# Patient Record
Sex: Female | Born: 1965
Health system: Southern US, Community
[De-identification: ages and names within clinical notes are randomized; demographics above are authoritative.]

## PROBLEM LIST (undated history)

## (undated) DIAGNOSIS — I10 Essential (primary) hypertension: Secondary | ICD-10-CM

## (undated) DIAGNOSIS — M791 Myalgia, unspecified site: Secondary | ICD-10-CM

## (undated) DIAGNOSIS — F419 Anxiety disorder, unspecified: Secondary | ICD-10-CM

## (undated) DIAGNOSIS — Z1501 Genetic susceptibility to malignant neoplasm of breast: Secondary | ICD-10-CM

## (undated) DIAGNOSIS — R011 Cardiac murmur, unspecified: Secondary | ICD-10-CM

## (undated) DIAGNOSIS — E785 Hyperlipidemia, unspecified: Secondary | ICD-10-CM

## (undated) DIAGNOSIS — R519 Headache, unspecified: Secondary | ICD-10-CM

## (undated) DIAGNOSIS — Z1509 Genetic susceptibility to other malignant neoplasm: Secondary | ICD-10-CM

## (undated) DIAGNOSIS — Z1589 Genetic susceptibility to other disease: Secondary | ICD-10-CM

## (undated) HISTORY — DX: Genetic susceptibility to other malignant neoplasm: Z15.09

## (undated) HISTORY — DX: Cardiac murmur, unspecified: R01.1

## (undated) HISTORY — DX: Myalgia, unspecified site: M79.10

## (undated) HISTORY — PX: FOOT SURGERY: SHX648

## (undated) HISTORY — DX: Genetic susceptibility to malignant neoplasm of breast: Z15.01

## (undated) HISTORY — DX: Genetic susceptibility to other disease: Z15.89

## (undated) HISTORY — PX: COLONOSCOPY: SHX174

## (undated) HISTORY — PX: BREAST SURGERY: SHX581

## (undated) HISTORY — DX: Hyperlipidemia, unspecified: E78.5

## (undated) HISTORY — DX: Anxiety disorder, unspecified: F41.9

## (undated) HISTORY — PX: POLYPECTOMY: SHX149

## (undated) HISTORY — DX: Essential (primary) hypertension: I10

## (undated) HISTORY — DX: Headache, unspecified: R51.9

---

## 2002-03-29 ENCOUNTER — Encounter: Admission: RE | Admit: 2002-03-29 | Discharge: 2002-03-29 | Payer: Self-pay | Admitting: Family Medicine

## 2002-03-29 ENCOUNTER — Encounter: Payer: Self-pay | Admitting: Family Medicine

## 2002-08-10 ENCOUNTER — Emergency Department (HOSPITAL_COMMUNITY): Admission: EM | Admit: 2002-08-10 | Discharge: 2002-08-11 | Payer: Self-pay | Admitting: Emergency Medicine

## 2002-08-11 ENCOUNTER — Encounter: Payer: Self-pay | Admitting: Emergency Medicine

## 2002-12-18 HISTORY — PX: PELVIC LAPAROSCOPY: SHX162

## 2003-01-04 ENCOUNTER — Ambulatory Visit (HOSPITAL_BASED_OUTPATIENT_CLINIC_OR_DEPARTMENT_OTHER): Admission: RE | Admit: 2003-01-04 | Discharge: 2003-01-04 | Payer: Self-pay | Admitting: Gynecology

## 2003-01-04 ENCOUNTER — Encounter (INDEPENDENT_AMBULATORY_CARE_PROVIDER_SITE_OTHER): Payer: Self-pay | Admitting: Specialist

## 2003-01-04 ENCOUNTER — Ambulatory Visit (HOSPITAL_COMMUNITY): Admission: RE | Admit: 2003-01-04 | Discharge: 2003-01-04 | Payer: Self-pay | Admitting: Gynecology

## 2003-09-14 ENCOUNTER — Other Ambulatory Visit: Admission: RE | Admit: 2003-09-14 | Discharge: 2003-09-14 | Payer: Self-pay | Admitting: Gynecology

## 2004-03-19 ENCOUNTER — Encounter: Admission: RE | Admit: 2004-03-19 | Discharge: 2004-03-19 | Payer: Self-pay | Admitting: Gynecology

## 2004-08-12 ENCOUNTER — Encounter: Admission: RE | Admit: 2004-08-12 | Discharge: 2004-09-05 | Payer: Self-pay | Admitting: Family Medicine

## 2004-09-17 ENCOUNTER — Other Ambulatory Visit: Admission: RE | Admit: 2004-09-17 | Discharge: 2004-09-17 | Payer: Self-pay | Admitting: Gynecology

## 2004-10-17 HISTORY — PX: NOVASURE ABLATION: SHX5394

## 2004-11-04 ENCOUNTER — Ambulatory Visit (HOSPITAL_COMMUNITY): Admission: RE | Admit: 2004-11-04 | Discharge: 2004-11-04 | Payer: Self-pay | Admitting: Gynecology

## 2004-12-29 ENCOUNTER — Emergency Department: Payer: Self-pay | Admitting: Unknown Physician Specialty

## 2004-12-29 ENCOUNTER — Other Ambulatory Visit: Payer: Self-pay

## 2005-10-13 ENCOUNTER — Other Ambulatory Visit: Admission: RE | Admit: 2005-10-13 | Discharge: 2005-10-13 | Payer: Self-pay | Admitting: Gynecology

## 2006-10-28 ENCOUNTER — Other Ambulatory Visit: Admission: RE | Admit: 2006-10-28 | Discharge: 2006-10-28 | Payer: Self-pay | Admitting: Gynecology

## 2007-04-14 ENCOUNTER — Encounter: Admission: RE | Admit: 2007-04-14 | Discharge: 2007-04-14 | Payer: Self-pay | Admitting: Gynecology

## 2007-11-03 ENCOUNTER — Ambulatory Visit: Payer: Self-pay | Admitting: Gynecology

## 2007-11-03 ENCOUNTER — Other Ambulatory Visit: Admission: RE | Admit: 2007-11-03 | Discharge: 2007-11-03 | Payer: Self-pay | Admitting: Gynecology

## 2007-11-03 ENCOUNTER — Encounter: Payer: Self-pay | Admitting: Gynecology

## 2008-05-17 ENCOUNTER — Encounter: Admission: RE | Admit: 2008-05-17 | Discharge: 2008-05-17 | Payer: Self-pay | Admitting: Gynecology

## 2008-06-14 ENCOUNTER — Emergency Department (HOSPITAL_COMMUNITY): Admission: EM | Admit: 2008-06-14 | Discharge: 2008-06-14 | Payer: Self-pay | Admitting: Emergency Medicine

## 2008-06-27 ENCOUNTER — Encounter: Admission: RE | Admit: 2008-06-27 | Discharge: 2008-06-27 | Payer: Self-pay | Admitting: Family Medicine

## 2008-11-15 ENCOUNTER — Encounter: Payer: Self-pay | Admitting: Gynecology

## 2008-11-15 ENCOUNTER — Ambulatory Visit: Payer: Self-pay | Admitting: Gynecology

## 2008-11-15 ENCOUNTER — Other Ambulatory Visit: Admission: RE | Admit: 2008-11-15 | Discharge: 2008-11-15 | Payer: Self-pay | Admitting: Gynecology

## 2009-07-04 ENCOUNTER — Encounter: Admission: RE | Admit: 2009-07-04 | Discharge: 2009-07-04 | Payer: Self-pay | Admitting: Gynecology

## 2009-11-28 ENCOUNTER — Ambulatory Visit: Payer: Self-pay | Admitting: Gynecology

## 2009-11-28 ENCOUNTER — Other Ambulatory Visit: Admission: RE | Admit: 2009-11-28 | Discharge: 2009-11-28 | Payer: Self-pay | Admitting: Gynecology

## 2010-03-09 ENCOUNTER — Encounter: Payer: Self-pay | Admitting: Gynecology

## 2010-07-04 NOTE — H&P (Signed)
NAME:  Angela Ramsey, ROSENSTEEL                         ACCOUNT NO.:  [id number]   MEDICAL RECORD NO.:  [id number]                   PATIENT TYPE:  AMB   LOCATION:  [facility]                                 FACILITY:  [facility]   PHYSICIAN:  Timothy P. Fontaine, M.D.           DATE OF BIRTH:  07/06/1965   DATE OF ADMISSION:  01/04/2003  DATE OF DISCHARGE:                                HISTORY & PHYSICAL   She is having day surgery at [facility] January 04, 2003,  at 8:30 a.m.   CHIEF COMPLAINT:  Pelvic discomfort.   HISTORY OF PRESENT ILLNESS:  This is a 45 year old G2, P2 female, vasectomy  birth control, who presents with increasing pelvic discomfort, bloating and  cramping.  She has pain that is persistent with persistent dyspareunia as if  something is being hit centrally.  Outpatient evaluation included a normal  ultrasound, CBC which showed some low-grade anemia with hemoglobin of 11,  normal white count, and a normal TSH.  Patient is admitted at this time for  laser video laparoscopy.   PAST MEDICAL HISTORY:  Uncomplicated.   PAST SURGICAL HISTORY:  Spontaneous vaginal delivery x2.   ALLERGIES:  No current medications.   CURRENT MEDICATIONS:  Ibuprofen p.r.n. pain.   REVIEW OF SYSTEMS:  Noncontributory.   FAMILY HISTORY:  Noncontributory.   SOCIAL HISTORY:  Noncontributory.   ADMISSION PHYSICAL EXAMINATION:  VITAL SIGNS:  Afebrile, vital signs are  stable.  HEENT:  Normal.  LUNGS:  Clear.  CARDIAC:  Regular rate without rubs, murmurs or gallops.  ABDOMEN:  Benign.  PELVIC:  External, BUS, vagina normal.  Cervix normal.  Uterus normal size,  midline, mobile, nontender.  Adnexa without masses or tenderness.   ASSESSMENT:  A 45 year old G2, P2 female, vasectomy birth control, worsening  pelvic discomfort, persistent dyspareunia, normal ultrasound for laser video  laparoscopy.  I reviewed with the patient the various scenarios and  pathology possibilities to  include endometriosis, adhesive disease, or no  pathology found.  Patient and her husband were both counseled and understand  that there are no guarantees as far as pain relief and that the pain may  persist, worsen, or change following the procedure, and they understand and  accept this.  They also understand the possibility of finding a normal  pelvis, which no further therapy will be delivered.  The risks involved with  laser video laparoscopy was discussed including instrumentation,  insufflation, trocar placement, use of sharp blunt dissection,  electrocautery, and the use of laser.  The risks of inadvertent injury to  internal organs including bowel, bladder, ureters, vessels, and nerves  necessitating major exploratory reparative surgeries and future reparative  surgeries including ostomy formation was all discussed, understood and  accepted.  The risks of hemorrhage necessitating transfusion and the risk of  transfusion were discussed to include transfusion reaction, hepatitis, HIV,  mad cow disease, and other unknown entities.  The  risk of infection both  incisional requiring opening and draining of incisions as well as internal  requiring prolonged antibiotics was all discussed, understood and accepted.  The patient's questions were answered to her satisfaction and she is ready  to proceed with surgery.  Preoperative hemoglobin is 13.2 and a negative  qualitative HCG.                                               Timothy P. Audie Box, M.D.    TPF/MEDQ  D:  01/02/2003  T:  01/02/2003  Job:  [id number]

## 2010-07-04 NOTE — H&P (Signed)
Angela Ramsey, Angela Ramsey               ACCOUNT NO.:  [id number]   MEDICAL RECORD NO.:  [id number]          PATIENT TYPE:  AMB   LOCATION:  [facility]                           FACILITY:  [facility]   PHYSICIAN:  Timothy P. Fontaine, M.D.DATE OF BIRTH:  06/19/1965   DATE OF ADMISSION:  11/04/2004  DATE OF DISCHARGE:                                HISTORY & PHYSICAL   CHIEF COMPLAINT:  Menorrhagia.   HISTORY OF PRESENT ILLNESS:  A 45 year old G2, P2 female, vasectomy, birth  control who presents with a history of increasing menorrhagia. The patient  notes that her periods are lasting 3-4 days for which she needs to wear  night pads all the time and has had multiple bleed through episodes as well  as disrupting vacation.  The patient's outpatient evaluation included a  normal CBC, a normal thyroid function, and a negative sonohistogram. The  patient is admitted at this time for NovaSure endometrial ablation.   Past medical history is uncomplicated. Past surgical history includes  laparoscopy with diagnosis of endometriosis and 2004.   ALLERGIES:  None.   CURRENT MEDICATIONS:  Flexeril p.r.n. for intermittent low back pain   REVIEW OF SYSTEMS:  Noncontributory.   FAMILY HISTORY:  Noncontributory.   SOCIAL HISTORY:  Noncontributory.   PHYSICAL EXAMINATION:  VITAL SIGNS:  Afebrile. Vital signs were stable.  HEENT:  Normal.  LUNGS:  Clear.  CARDIAC:  Regular rate.  No rubs, murmurs, or gallops.  ABDOMEN: Abdominal exam benign.  PELVIC:  External BUS, vagina normal. Cervix normal. Uterus normal size,  midline and mobile. Adnexa without masses or tenderness.   ASSESSMENT:  A 45 year old G2, P2 female, vasectomy, birth control,  increasing menorrhagia becoming socially unacceptable with normal thyroid  studies, negative sonohistogram, normal Pap smear for NovaSure endometrial  ablation. I reviewed with the patient the risks, benefits, indications, and  alternatives for her menorrhagia and she  elects for endometrial ablation.  The expected intra-operative, postoperative courses were reviewed with her.  She understands that she should never achieve a pregnancy after this  procedure. Her husband has had vasectomy by also reviewed with her that she  has other exposure, she needs to assure were absolute contraception. I also  discussed long-term issues associated with endometrial ablation and the  issues and risks to include hematometria as well as the need to be followed  for cervical endometrial carcinoma in the future was also reviewed. The  acute intra-operative, postoperative risks were discussed to include  bleeding, transfusion, infection, uterine perforation, damage to internal  organs, including bowel, bladder, ureters, vessels and nerves necessitating  major exploratory reparative surgeries and future  surgeries including  ostomy formation were all reviewed, understood, and accepted. No guarantees  as far as menorrhagia relief were made. The patient understands that there  are failures and she may persist or have recurrent menorrhagia in future. I  also reviewed with her that the procedure is machine dependent and if there  is a malfunction with the machine or technical difficulties during the  procedure they may need to abort procedure and that she would not have her  procedure performed. The patient's questions were answered to her  satisfaction. She is ready to proceed with surgery.      Timothy P. Fontaine, M.D.  Electronically Signed     TPF/MEDQ  D:  10/29/2004  T:  10/29/2004  Job:  [id number]

## 2010-07-04 NOTE — Op Note (Signed)
NAME:  Angela Ramsey, Angela Ramsey                         ACCOUNT NO.:  [id number]   MEDICAL RECORD NO.:  [id number]                   PATIENT TYPE:  AMB   LOCATION:  [facility]                                 FACILITY:  [facility]   PHYSICIAN:  Timothy P. Fontaine, M.D.           DATE OF BIRTH:  1965/09/29   DATE OF PROCEDURE:  01/04/2003  DATE OF DISCHARGE:                                 OPERATIVE REPORT   PREOPERATIVE DIAGNOSES:  1. Pelvic pain.  2. Rule out endometriosis.   POSTOPERATIVE DIAGNOSIS:  Endometriosis.   PROCEDURE:  Laparoscopic biopsy and fulguration of endometriosis.   SURGEON:  Timothy P. Fontaine, M.D.   ANESTHESIA:  General endotracheal.   ESTIMATED BLOOD LOSS:  Minimal.   SPECIMENS:  Peritoneal biopsy x2.   COMPLICATIONS:  None.   FINDINGS:  Anterior cul-de-sac normal.  Posterior cul-de-sac with classic  fibrotic endometriotic implant, upper lateral to uterosacral ligament on the  left, inferior to the ureter, right pelvic sidewall underneath the ovary,  above the ureter and major vessels, inferior to the infundibulopelvic  ligament vessels, a classic white fibrotic lesion biopsied.  Both areas  fulgurated with bipolar cautery in a brushing technique.  No other evidence  of endometriosis in the posterior cul-de-sac.  Uterus normal size, shape,  contour.  Right and left fallopian tubes normal length, caliber, fimbriated  ends.  Right and left ovaries grossly normal, free and mobile.  Prominent  adnexal vessels noted consistent with a congestion picture.  Upper abdominal  exam is normal, noting appendix grossly normal, free and mobile.  Liver  smooth, no abnormalities, gallbladder not visualized.   DESCRIPTION OF PROCEDURE:  The patient was taken to the operating room,  underwent general endotracheal anesthesia, placed in the low dorsal  lithotomy position, and received an abdominal, perineal, and vaginal  preparation with Betadine solution.  The bladder emptied with  in-and-out  Foley catheterization.  EUA performed.  Hulka tenaculum placed on the  cervix.  The patient was draped in the usual fashion.  Infraumbilical  incision was made, the Veress needle placed, its position verified with  water.  Three liters of carbon dioxide gas infused without difficulty.  The  10 mm laparoscopic trocar was then placed without difficulty, its position  verified visually.  The right of midline suprapubic 5 mm port was then  placed without difficulty after transillumination for the vessels under  direct visualization.  Examination of the pelvic organs and upper abdominal  exam was carried out with findings noted above.  Both areas of endometriosis  were biopsied, subsequently were obliterated using bipolar cautery in a  brushing technique.  The suprapubic port was then removed, the gas allowed  to escape.  All areas reinspected under a low-pressure situation, showing  adequate hemostasis.  The infraumbilical port was then backed out under  direct visualization, noting adequate hemostasis and no evidence of hernia  formation.  A 0 Vicryl interrupted subcutaneous  fascial stitch was placed infraumbilically.  Both sites injected with 0.25%  Marcaine and both skin incisions closed using Dermabond skin adhesive.  The  Hulka tenaculum was removed, the patient placed in supine position, awakened  without difficulty, taken to the recovery room in good condition having  tolerated the procedure well.                                               Timothy P. Audie Box, M.D.    TPF/MEDQ  D:  01/04/2003  T:  01/04/2003  Job:  [id number]

## 2010-07-04 NOTE — Op Note (Signed)
NAMEGREGORY, Angela Ramsey               ACCOUNT NO.:  [id number]   MEDICAL RECORD NO.:  [id number]          PATIENT TYPE:  AMB   LOCATION:  [facility]                           FACILITY:  [facility]   PHYSICIAN:  Timothy P. Fontaine, M.D.DATE OF BIRTH:  04-May-1965   DATE OF PROCEDURE:  11/04/2004  DATE OF DISCHARGE:                                 OPERATIVE REPORT   PREOPERATIVE DIAGNOSIS:  Menorrhagia.   POSTOPERATIVE DIAGNOSIS:  Same.   PROCEDURES:  NovaSure endometrial ablation, hysteroscopy, paracervical  block.   SURGEON:  Timothy P. Fontaine, M.D.   ANESTHETIC:  General.   ESTIMATED BLOOD LOSS:  Minimal.   COMPLICATIONS:  None.   SPECIMEN:  None.   FINDINGS:  Post ablation hysteroscopy cavity was normal.  No gross  abnormalities.  EUA:  External, BUS, vagina is normal.  Cervix is grossly  normal.  Uterus normal size, midline and mobile, retroflexed.  Adnexa  without masses.   PROCEDURE:  The patient was taken to the operating room and underwent  general anesthesia, was placed in low dorsal lithotomy position, received a  perineal and vaginal preparation with Betadine solution, and the bladder was  emptied with in-and-out Foley catheterization per nursing personnel.  The  patient was then draped in usual fashion.  EUA performed.  Subsequently  cervix visualized with a speculum, anterior lip grasped with single-tooth  tenaculum and a paracervical block using 1% lidocaine was placed, a total of  9 mL.  The uterus was then sounded to 9 cm and subsequently the cervical  length was measured at 4 cm, dilating to a #8 dilator.  Cavity length was  calculated at 5 cm.  The NovaSure instrument was then placed within the  cavity and after seating of the instrument, cavity width of 4 cm was  determined.  The carbon dioxide perforation test was performed without  evidence of perforation and subsequently the NovaSure endometrial ablation  was performed, power of 110, time 1 minute 9 seconds, without  difficulty.  The instrument was then removed and subsequently hysteroscopy was performed  with the diagnostic hysteroscope with a normal-appearing post-ablative  cavity, good distension, no evidence of perforation.  The  tenaculum was then removed.  Adequate hemostasis achieved with pressure.  The speculum removed.  The patient placed in supine position, awakened  without difficulty and taken to the recovery room in good condition, having  tolerated the procedure well.      Timothy P. Fontaine, M.D.  Electronically Signed     TPF/MEDQ  D:  11/04/2004  T:  11/04/2004  Job:  [id number]

## 2010-07-16 ENCOUNTER — Other Ambulatory Visit: Payer: Self-pay | Admitting: Gynecology

## 2010-07-16 DIAGNOSIS — Z1231 Encounter for screening mammogram for malignant neoplasm of breast: Secondary | ICD-10-CM

## 2010-07-24 ENCOUNTER — Ambulatory Visit
Admission: RE | Admit: 2010-07-24 | Discharge: 2010-07-24 | Disposition: A | Discharge status: Home / Self Care | Payer: BC Managed Care – PPO | Source: Ambulatory Visit | Attending: Gynecology | Admitting: Gynecology

## 2010-07-24 DIAGNOSIS — Z1231 Encounter for screening mammogram for malignant neoplasm of breast: Secondary | ICD-10-CM

## 2010-12-01 ENCOUNTER — Encounter: Payer: Self-pay | Admitting: *Deleted

## 2010-12-04 ENCOUNTER — Encounter: Payer: BC Managed Care – PPO | Admitting: Gynecology

## 2010-12-18 ENCOUNTER — Ambulatory Visit (INDEPENDENT_AMBULATORY_CARE_PROVIDER_SITE_OTHER): Payer: BC Managed Care – PPO | Admitting: Gynecology

## 2010-12-18 ENCOUNTER — Encounter: Payer: Self-pay | Admitting: Gynecology

## 2010-12-18 VITALS — BP 120/70 | Ht 60.0 in | Wt 180.0 lb

## 2010-12-18 DIAGNOSIS — F419 Anxiety disorder, unspecified: Secondary | ICD-10-CM

## 2010-12-18 DIAGNOSIS — R82998 Other abnormal findings in urine: Secondary | ICD-10-CM

## 2010-12-18 DIAGNOSIS — F411 Generalized anxiety disorder: Secondary | ICD-10-CM

## 2010-12-18 DIAGNOSIS — Z01419 Encounter for gynecological examination (general) (routine) without abnormal findings: Secondary | ICD-10-CM

## 2010-12-18 DIAGNOSIS — Z131 Encounter for screening for diabetes mellitus: Secondary | ICD-10-CM

## 2010-12-18 DIAGNOSIS — N915 Oligomenorrhea, unspecified: Secondary | ICD-10-CM

## 2010-12-18 DIAGNOSIS — Z13220 Encounter for screening for lipoid disorders: Secondary | ICD-10-CM

## 2010-12-18 LAB — VITAMIN B12: Vitamin B-12: 375 pg/mL (ref 211–911)

## 2010-12-18 MED ORDER — FLUOXETINE HCL 20 MG PO CAPS: 20.0000 mg | ORAL_CAPSULE | Freq: Every day | ORAL | Status: DC

## 2010-12-18 NOTE — Progress Notes (Signed)
Addended byCammie Mcgee T on: 12/18/2010 12:59 PM   Modules accepted: Orders

## 2010-12-18 NOTE — Progress Notes (Signed)
Angela Ramsey August 14, 1965 [id number]        45 y.o.  for annual exam.  Notes increased anxiety with a short temper. Has light menses since her ablation. Occasional hot flash and night sweats.  Vasectomy birthcontrol.  Past medical history,surgical history, medications, allergies, family history and social history were all reviewed and documented in the EPIC chart. ROS:  Was performed and pertinent positives and negatives are included in the history.  Exam: chaperone present Filed Vitals:   12/18/10 1111  BP: 120/70   General appearance  Normal Skin grossly normal Head/Neck normal with no cervical or supraclavicular adenopathy thyroid normal Lungs  clear Cardiac RR, without RMG Abdominal  soft, nontender, without masses, organomegaly or hernia Breasts  examined lying and sitting without masses, retractions, discharge or axillary adenopathy. Pelvic  Ext/BUS/vagina  normal   Cervix  normal    Uterus  retroverted, normal size, shape and contour, midline and mobile nontender   Adnexa  Without masses or tenderness    Anus and perineum  normal   Rectovaginal  normal sphincter tone without palpated masses or tenderness.    Assessment/Plan:  45 y.o. female for annual exam.    1. Anxiety and anger. Patient was more PMS type symptoms. She does have light menses and does have skips occasionally. She is status post ablation. Will check baseline FSH and TSH. I reviewed options and she wants to go ahead and try fluoxetine 20 mg daily I prescribed her times a year. Side effect profile as well as risk of suicide ideation was reviewed understood and accepted. 2. Health maintenance. SBE monthly reviewed. She had her mammo- in May will continue with annual mammography. I did not do a Pap smear today. She has no history of abnormal Pap smears in the past and has been having annual Pap smears. I reviewed current screening guidelines with less frequent intervals and she is comfortable with this we'll plan an  every 3 year Pap smear. Baseline CBC urinalysis lipid profile and glucose was done. She did ask for a B12 level was probably she has a strong family history of B12 deficiency I wanted ordered this also.  Assuming she continues well from a gynecologic standpoint she'll see me in a year sooner as needed.    Dara Lords MD, 11:50 AM 12/18/2010

## 2010-12-19 MED ORDER — SULFAMETHOXAZOLE-TRIMETHOPRIM 800-160 MG PO TABS: 1.0000 | ORAL_TABLET | Freq: Two times a day (BID) | ORAL | Status: AC

## 2010-12-19 NOTE — Progress Notes (Signed)
Addended by: Dara Lords on: 12/19/2010 11:01 AM   Modules accepted: Orders

## 2011-01-20 ENCOUNTER — Telehealth: Payer: Self-pay | Admitting: *Deleted

## 2011-01-20 NOTE — Telephone Encounter (Signed)
Pt called wanting tsh and fsh results from last office visit, results given to pt.

## 2011-05-18 ENCOUNTER — Telehealth: Payer: Self-pay | Admitting: *Deleted

## 2011-05-18 NOTE — Telephone Encounter (Signed)
Based upon 1 family member postmenopausal with breast cancer they usually do not recommend genetic screening.  If there are several members with breast cancer and or ovarian cancer particularly premenopausal is when they usually recommend genetic screening. Regardless your aunt is the one that should be screened because she has the breast cancer and would have the gene if indeed there is a gene causing it and if she is positive then they would screen other family members.

## 2011-05-18 NOTE — Telephone Encounter (Signed)
Pt has just found out that her mother's twin identical twin sister has been diagnosed with breast cancer. Pt mother had her mammogram done and is waiting for the results.  The pt would like to know if she could have  genetic testing done? Please advise

## 2011-05-19 NOTE — Telephone Encounter (Signed)
What I would recommend is that the patient make an appointment with the genetic counselor at the [facility] oncology clinic and we can give her that number, and then they can look at her pedigree and decide if genetic testing is appropriate.

## 2011-05-19 NOTE — Telephone Encounter (Signed)
Pt informed with the below note, pt given number to call.

## 2011-05-19 NOTE — Telephone Encounter (Signed)
Explained all the below with pt and she said that her grandmother and her other aunt both had breast cancer and the aunt had it twice. Pt wants to know if she should be screened because of this? Please advise

## 2011-07-14 ENCOUNTER — Ambulatory Visit: Payer: Self-pay | Admitting: Podiatry

## 2011-08-06 ENCOUNTER — Ambulatory Visit: Payer: Self-pay | Admitting: Podiatry

## 2011-08-24 ENCOUNTER — Other Ambulatory Visit: Payer: Self-pay | Admitting: Gynecology

## 2011-08-24 DIAGNOSIS — Z1231 Encounter for screening mammogram for malignant neoplasm of breast: Secondary | ICD-10-CM

## 2011-08-25 ENCOUNTER — Ambulatory Visit
Admission: RE | Admit: 2011-08-25 | Discharge: 2011-08-25 | Disposition: A | Discharge status: Home / Self Care | Payer: BC Managed Care – PPO | Source: Ambulatory Visit | Attending: Gynecology | Admitting: Gynecology

## 2011-08-25 DIAGNOSIS — Z1231 Encounter for screening mammogram for malignant neoplasm of breast: Secondary | ICD-10-CM

## 2011-08-26 ENCOUNTER — Ambulatory Visit: Payer: Self-pay | Admitting: Podiatry

## 2011-09-15 ENCOUNTER — Ambulatory Visit: Payer: Self-pay | Admitting: Podiatry

## 2011-12-22 ENCOUNTER — Ambulatory Visit (INDEPENDENT_AMBULATORY_CARE_PROVIDER_SITE_OTHER): Payer: BC Managed Care – PPO | Admitting: Gynecology

## 2011-12-22 ENCOUNTER — Encounter: Payer: Self-pay | Admitting: Gynecology

## 2011-12-22 VITALS — BP 120/74 | Ht 60.0 in | Wt 182.0 lb

## 2011-12-22 DIAGNOSIS — N809 Endometriosis, unspecified: Secondary | ICD-10-CM | POA: Insufficient documentation

## 2011-12-22 DIAGNOSIS — Z13220 Encounter for screening for lipoid disorders: Secondary | ICD-10-CM

## 2011-12-22 DIAGNOSIS — Z131 Encounter for screening for diabetes mellitus: Secondary | ICD-10-CM

## 2011-12-22 DIAGNOSIS — Z01419 Encounter for gynecological examination (general) (routine) without abnormal findings: Secondary | ICD-10-CM

## 2011-12-22 LAB — COMPREHENSIVE METABOLIC PANEL WITH GFR
ALT: 10 U/L (ref 0–35)
AST: 15 U/L (ref 0–37)
Albumin: 4.2 g/dL (ref 3.5–5.2)
Alkaline Phosphatase: 88 U/L (ref 39–117)
BUN: 14 mg/dL (ref 6–23)
CO2: 28 meq/L (ref 19–32)
Calcium: 9.4 mg/dL (ref 8.4–10.5)
Chloride: 102 meq/L (ref 96–112)
Creat: 0.67 mg/dL (ref 0.50–1.10)
Glucose, Bld: 74 mg/dL (ref 70–99)
Potassium: 4.3 meq/L (ref 3.5–5.3)
Sodium: 138 meq/L (ref 135–145)
Total Bilirubin: 0.4 mg/dL (ref 0.3–1.2)
Total Protein: 7 g/dL (ref 6.0–8.3)

## 2011-12-22 LAB — CBC WITH DIFFERENTIAL/PLATELET
Eosinophils Absolute: 0.2 10*3/uL (ref 0.0–0.7)
Hemoglobin: 13.5 g/dL (ref 12.0–15.0)
Lymphocytes Relative: 32 % (ref 12–46)
Lymphs Abs: 3.4 10*3/uL (ref 0.7–4.0)
MCH: 29 pg (ref 26.0–34.0)
Monocytes Relative: 5 % (ref 3–12)
Neutrophils Relative %: 61 % (ref 43–77)
RBC: 4.65 MIL/uL (ref 3.87–5.11)
WBC: 10.7 10*3/uL — ABNORMAL HIGH (ref 4.0–10.5)

## 2011-12-22 LAB — LIPID PANEL
Cholesterol: 193 mg/dL (ref 0–200)
HDL: 54 mg/dL (ref >39)
Total CHOL/HDL Ratio: 3.6 Ratio
Triglycerides: 110 mg/dL (ref <150)
VLDL: 22 mg/dL (ref 0–40)

## 2011-12-22 MED ORDER — ALPRAZOLAM 0.5 MG PO TABS: 0.5000 mg | ORAL_TABLET | Freq: Every evening | ORAL | Status: DC | PRN

## 2011-12-22 NOTE — Patient Instructions (Signed)
Follow up for annual exam in one year 

## 2011-12-22 NOTE — Progress Notes (Signed)
DEIJA BUHRMAN 14-Aug-1965 [id number]        46 y.o.  G2P2002 for annual exam.    Past medical history,surgical history, medications, allergies, family history and social history were all reviewed and documented in the EPIC chart. ROS:  Was performed and pertinent positives and negatives are included in the history.  Exam: Kim assistant Filed Vitals:   12/22/11 1128  BP: 120/74  Height: 5' (1.524 m)  Weight: 182 lb (82.555 kg)   General appearance  Normal Skin grossly normal Head/Neck normal with no cervical or supraclavicular adenopathy thyroid normal Lungs  clear Cardiac RR, without RMG Abdominal  soft, nontender, without masses, organomegaly or hernia Breasts  examined lying and sitting without masses, retractions, discharge or axillary adenopathy. Pelvic  Ext/BUS/vagina  normal   Cervix  normal   Uterus  anteverted, normal size, shape and contour, midline and mobile nontender   Adnexa  Without masses or tenderness    Anus and perineum  normal   Rectovaginal  normal sphincter tone without palpated masses or tenderness.    Assessment/Plan:  46 y.o. G46P2002 female for annual exam, vasectomy birth control.   1. History NovaSure ablation. Doing well with light monthly menses. Continue to monitor. 2. Anxiety. Had tried fluoxetine last year but didn't seem to help and stopped it. Asked for refill on Xanax that she uses when necessary. I refilled Xanax 0.5 #30 with 2 refills. 3. Mammography. Patient had mammogram July 2013. We'll continue with annual mammography. She does have a strong family history in that her maternal grandmother and 2 maternal aunts both had breast cancer. Strongly recommended they consider genetic testing. Apparently this has been discussed and urged by their physicians and they so far has not done this. I reviewed with the patient the preference for the affected individual to be tested. If all else fails we may consider testing her if she elects to do so but not  the ideal. Certainly continue with annual mammography is and SBE monthly. 4. Pap smear. The Pap smear done today. Last Pap smear 2011. No history of abnormal Pap smears previously. Plan every 3-5 year screening. 5. Muscle twitching. Patient notes a one-month history of scalp muscle twitching over her right eye comes and goes lasts for minutes. Does not affect vision.  Exam is normal. Will check comprehensive metabolic panel. If continues recommended neurology follow up and she'll arrange or call. 6. Health maintenance. Baseline CBC comprehensive metabolic panel lipid profile urinalysis ordered. Follow up one year, sooner as needed.    Dara Lords MD, 12:06 PM 12/22/2011

## 2011-12-23 LAB — URINALYSIS W MICROSCOPIC + REFLEX CULTURE
Bilirubin Urine: NEGATIVE
Glucose, UA: NEGATIVE mg/dL
Hgb urine dipstick: NEGATIVE
Ketones, ur: NEGATIVE mg/dL
Protein, ur: NEGATIVE mg/dL
Urobilinogen, UA: 0.2 mg/dL (ref 0.0–1.0)

## 2012-01-30 ENCOUNTER — Ambulatory Visit: Payer: Self-pay | Admitting: Neurology

## 2012-04-02 ENCOUNTER — Other Ambulatory Visit: Payer: Self-pay

## 2012-09-19 ENCOUNTER — Other Ambulatory Visit: Payer: Self-pay

## 2012-09-19 DIAGNOSIS — Z1231 Encounter for screening mammogram for malignant neoplasm of breast: Secondary | ICD-10-CM

## 2012-10-05 ENCOUNTER — Ambulatory Visit
Admission: RE | Admit: 2012-10-05 | Discharge: 2012-10-05 | Disposition: A | Discharge status: Home / Self Care | Payer: BC Managed Care – PPO | Source: Ambulatory Visit

## 2012-10-05 DIAGNOSIS — Z1231 Encounter for screening mammogram for malignant neoplasm of breast: Secondary | ICD-10-CM

## 2012-12-22 ENCOUNTER — Encounter: Payer: BC Managed Care – PPO | Admitting: Gynecology

## 2012-12-22 ENCOUNTER — Other Ambulatory Visit: Payer: Self-pay

## 2013-03-02 ENCOUNTER — Encounter: Payer: Self-pay | Admitting: Gynecology

## 2013-08-10 ENCOUNTER — Other Ambulatory Visit (HOSPITAL_COMMUNITY)
Admission: RE | Admit: 2013-08-10 | Discharge: 2013-08-10 | Disposition: A | Discharge status: Home / Self Care | Payer: BC Managed Care – PPO | Source: Ambulatory Visit | Attending: Gynecology | Admitting: Gynecology

## 2013-08-10 ENCOUNTER — Encounter: Payer: Self-pay | Admitting: Gynecology

## 2013-08-10 ENCOUNTER — Ambulatory Visit (INDEPENDENT_AMBULATORY_CARE_PROVIDER_SITE_OTHER): Payer: BC Managed Care – PPO | Admitting: Gynecology

## 2013-08-10 VITALS — BP 122/78 | Ht 61.0 in | Wt 193.0 lb

## 2013-08-10 DIAGNOSIS — Z1151 Encounter for screening for human papillomavirus (HPV): Secondary | ICD-10-CM | POA: Insufficient documentation

## 2013-08-10 DIAGNOSIS — H8110 Benign paroxysmal vertigo, unspecified ear: Secondary | ICD-10-CM

## 2013-08-10 DIAGNOSIS — Z01419 Encounter for gynecological examination (general) (routine) without abnormal findings: Secondary | ICD-10-CM | POA: Insufficient documentation

## 2013-08-10 LAB — CBC WITH DIFFERENTIAL/PLATELET
BASOS ABS: 0 10*3/uL (ref 0.0–0.1)
Basophils Relative: 0 % (ref 0–1)
EOS PCT: 2 % (ref 0–5)
Eosinophils Absolute: 0.2 10*3/uL (ref 0.0–0.7)
HCT: 39.8 % (ref 36.0–46.0)
Hemoglobin: 13.2 g/dL (ref 12.0–15.0)
LYMPHS ABS: 3.2 10*3/uL (ref 0.7–4.0)
LYMPHS PCT: 29 % (ref 12–46)
MCH: 28.8 pg (ref 26.0–34.0)
MCHC: 33.2 g/dL (ref 30.0–36.0)
MCV: 86.9 fL (ref 78.0–100.0)
Monocytes Absolute: 0.4 10*3/uL (ref 0.1–1.0)
Monocytes Relative: 4 % (ref 3–12)
NEUTROS ABS: 7.2 10*3/uL (ref 1.7–7.7)
NEUTROS PCT: 65 % (ref 43–77)
PLATELETS: 336 10*3/uL (ref 150–400)
RBC: 4.58 MIL/uL (ref 3.87–5.11)
RDW: 13.7 % (ref 11.5–15.5)
WBC: 11.1 10*3/uL — AB (ref 4.0–10.5)

## 2013-08-10 LAB — COMPREHENSIVE METABOLIC PANEL
ALBUMIN: 4.2 g/dL (ref 3.5–5.2)
ALT: 10 U/L (ref 0–35)
AST: 14 U/L (ref 0–37)
Alkaline Phosphatase: 94 U/L (ref 39–117)
BUN: 12 mg/dL (ref 6–23)
CALCIUM: 9.4 mg/dL (ref 8.4–10.5)
CHLORIDE: 101 meq/L (ref 96–112)
CO2: 25 meq/L (ref 19–32)
CREATININE: 0.68 mg/dL (ref 0.50–1.10)
Glucose, Bld: 92 mg/dL (ref 70–99)
POTASSIUM: 4.1 meq/L (ref 3.5–5.3)
SODIUM: 135 meq/L (ref 135–145)
TOTAL PROTEIN: 7.1 g/dL (ref 6.0–8.3)
Total Bilirubin: 0.4 mg/dL (ref 0.2–1.2)

## 2013-08-10 LAB — LIPID PANEL
CHOLESTEROL: 211 mg/dL — AB (ref 0–200)
HDL: 62 mg/dL (ref >39)
LDL CALC: 129 mg/dL — AB (ref 0–99)
Total CHOL/HDL Ratio: 3.4 Ratio
Triglycerides: 98 mg/dL (ref <150)
VLDL: 20 mg/dL (ref 0–40)

## 2013-08-10 NOTE — Addendum Note (Signed)
Addended by: Nelva Nay on: 08/10/2013 10:34 AM   Modules accepted: Orders

## 2013-08-10 NOTE — Patient Instructions (Addendum)
Followup with your primary physician if your dizziness continues.  Followup in one year for annual GYN exam, sooner as needed.  You may obtain a copy of any labs that were done today by logging onto MyChart as outlined in the instructions provided with your AVS (after visit summary). The office will not call with normal lab results but certainly if there are any significant abnormalities then we will contact you.   Health Maintenance, Female A healthy lifestyle and preventative care can promote health and wellness.  Maintain regular health, dental, and eye exams.  Eat a healthy diet. Foods like vegetables, fruits, whole grains, low-fat dairy products, and lean protein foods contain the nutrients you need without too many calories. Decrease your intake of foods high in solid fats, added sugars, and salt. Get information about a proper diet from your caregiver, if necessary.  Regular physical exercise is one of the most important things you can do for your health. Most adults should get at least 150 minutes of moderate-intensity exercise (any activity that increases your heart rate and causes you to sweat) each week. In addition, most adults need muscle-strengthening exercises on 2 or more days a week.   Maintain a healthy weight. The body mass index (BMI) is a screening tool to identify possible weight problems. It provides an estimate of body fat based on height and weight. Your caregiver can help determine your BMI, and can help you achieve or maintain a healthy weight. For adults 20 years and older:  A BMI below 18.5 is considered underweight.  A BMI of 18.5 to 24.9 is normal.  A BMI of 25 to 29.9 is considered overweight.  A BMI of 30 and above is considered obese.  Maintain normal blood lipids and cholesterol by exercising and minimizing your intake of saturated fat. Eat a balanced diet with plenty of fruits and vegetables. Blood tests for lipids and cholesterol should begin at age 62  and be repeated every 5 years. If your lipid or cholesterol levels are high, you are over 50, or you are a high risk for heart disease, you may need your cholesterol levels checked more frequently.Ongoing high lipid and cholesterol levels should be treated with medicines if diet and exercise are not effective.  If you smoke, find out from your caregiver how to quit. If you do not use tobacco, do not start.  Lung cancer screening is recommended for adults aged 64 80 years who are at high risk for developing lung cancer because of a history of smoking. Yearly low-dose computed tomography (CT) is recommended for people who have at least a 30-pack-year history of smoking and are a current smoker or have quit within the past 15 years. A pack year of smoking is smoking an average of 1 pack of cigarettes a day for 1 year (for example: 1 pack a day for 30 years or 2 packs a day for 15 years). Yearly screening should continue until the smoker has stopped smoking for at least 15 years. Yearly screening should also be stopped for people who develop a health problem that would prevent them from having lung cancer treatment.  If you are pregnant, do not drink alcohol. If you are breastfeeding, be very cautious about drinking alcohol. If you are not pregnant and choose to drink alcohol, do not exceed 1 drink per day. One drink is considered to be 12 ounces (355 mL) of beer, 5 ounces (148 mL) of wine, or 1.5 ounces (44 mL) of liquor.  Avoid use of street drugs. Do not share needles with anyone. Ask for help if you need support or instructions about stopping the use of drugs.  High blood pressure causes heart disease and increases the risk of stroke. Blood pressure should be checked at least every 1 to 2 years. Ongoing high blood pressure should be treated with medicines, if weight loss and exercise are not effective.  If you are 36 to 46 years old, ask your caregiver if you should take aspirin to prevent  strokes.  Diabetes screening involves taking a blood sample to check your fasting blood sugar level. This should be done once every 3 years, after age 30, if you are within normal weight and without risk factors for diabetes. Testing should be considered at a younger age or be carried out more frequently if you are overweight and have at least 1 risk factor for diabetes.  Breast cancer screening is essential preventative care for women. You should practice "breast self-awareness." This means understanding the normal appearance and feel of your breasts and may include breast self-examination. Any changes detected, no matter how small, should be reported to a caregiver. Women in their 63s and 30s should have a clinical breast exam (CBE) by a caregiver as part of a regular health exam every 1 to 3 years. After age 46, women should have a CBE every year. Starting at age 41, women should consider having a mammogram (breast X-ray) every year. Women who have a family history of breast cancer should talk to their caregiver about genetic screening. Women at a high risk of breast cancer should talk to their caregiver about having an MRI and a mammogram every year.  Breast cancer gene (BRCA)-related cancer risk assessment is recommended for women who have family members with BRCA-related cancers. BRCA-related cancers include breast, ovarian, tubal, and peritoneal cancers. Having family members with these cancers may be associated with an increased risk for harmful changes (mutations) in the breast cancer genes BRCA1 and BRCA2. Results of the assessment will determine the need for genetic counseling and BRCA1 and BRCA2 testing.  The Pap test is a screening test for cervical cancer. Women should have a Pap test starting at age 64. Between ages 70 and 95, Pap tests should be repeated every 2 years. Beginning at age 71, you should have a Pap test every 3 years as long as the past 3 Pap tests have been normal. If you had a  hysterectomy for a problem that was not cancer or a condition that could lead to cancer, then you no longer need Pap tests. If you are between ages 56 and 49, and you have had normal Pap tests going back 10 years, you no longer need Pap tests. If you have had past treatment for cervical cancer or a condition that could lead to cancer, you need Pap tests and screening for cancer for at least 20 years after your treatment. If Pap tests have been discontinued, risk factors (such as a new sexual partner) need to be reassessed to determine if screening should be resumed. Some women have medical problems that increase the chance of getting cervical cancer. In these cases, your caregiver may recommend more frequent screening and Pap tests.  The human papillomavirus (HPV) test is an additional test that may be used for cervical cancer screening. The HPV test looks for the virus that can cause the cell changes on the cervix. The cells collected during the Pap test can be tested for HPV. The  HPV test could be used to screen women aged 60 years and older, and should be used in women of any age who have unclear Pap test results. After the age of 62, women should have HPV testing at the same frequency as a Pap test.  Colorectal cancer can be detected and often prevented. Most routine colorectal cancer screening begins at the age of 55 and continues through age 35. However, your caregiver may recommend screening at an earlier age if you have risk factors for colon cancer. On a yearly basis, your caregiver may provide home test kits to check for hidden blood in the stool. Use of a small camera at the end of a tube, to directly examine the colon (sigmoidoscopy or colonoscopy), can detect the earliest forms of colorectal cancer. Talk to your caregiver about this at age 74, when routine screening begins. Direct examination of the colon should be repeated every 5 to 10 years through age 34, unless early forms of pre-cancerous  polyps or small growths are found.  Hepatitis C blood testing is recommended for all people born from [birth year] through 1963 and any individual with known risks for hepatitis C.  Practice safe sex. Use condoms and avoid high-risk sexual practices to reduce the spread of sexually transmitted infections (STIs). Sexually active women aged 39 and younger should be checked for Chlamydia, which is a common sexually transmitted infection. Older women with new or multiple partners should also be tested for Chlamydia. Testing for other STIs is recommended if you are sexually active and at increased risk.  Osteoporosis is a disease in which the bones lose minerals and strength with aging. This can result in serious bone fractures. The risk of osteoporosis can be identified using a bone density scan. Women ages 53 and over and women at risk for fractures or osteoporosis should discuss screening with their caregivers. Ask your caregiver whether you should be taking a calcium supplement or vitamin D to reduce the rate of osteoporosis.  Menopause can be associated with physical symptoms and risks. Hormone replacement therapy is available to decrease symptoms and risks. You should talk to your caregiver about whether hormone replacement therapy is right for you.  Use sunscreen. Apply sunscreen liberally and repeatedly throughout the day. You should seek shade when your shadow is shorter than you. Protect yourself by wearing long sleeves, pants, a wide-brimmed hat, and sunglasses year round, whenever you are outdoors.  Notify your caregiver of new moles or changes in moles, especially if there is a change in shape or color. Also notify your caregiver if a mole is larger than the size of a pencil eraser.  Stay current with your immunizations. Document Released: 08/18/2010 Document Revised: 05/30/2012 Document Reviewed: 08/18/2010 [facility] Patient Information 2014 [place].

## 2013-08-10 NOTE — Progress Notes (Addendum)
Angela Ramsey Mar 27, 1965 [id number]        48 y.o.  G2P2002 for annual exam.  Several issues noted below.  Past medical history,surgical history, problem list, medications, allergies, family history and social history were all reviewed and documented as reviewed in the EPIC chart.  ROS:  12 system ROS performed with pertinent positives and negatives included in the history, assessment and plan.    Additional significant findings :  None   Exam: Angela Ramsey Filed Vitals:   08/10/13 0939  BP: 122/78  Height: 5\' 1"  (1.549 m)  Weight: 193 lb (87.544 kg)   General appearance:  Normal affect, orientation and appearance. Skin: Grossly normal HEENT: Without gross lesions.  No cervical or supraclavicular adenopathy. Thyroid normal.  Lungs:  Clear without wheezing, rales or rhonchi Cardiac: RR, without RMG Abdominal:  Soft, nontender, without masses, guarding, rebound, organomegaly or hernia Breasts:  Examined lying and sitting without masses, retractions, discharge or axillary adenopathy. Pelvic:  Ext/BUS/vagina normal.  Cervix normal.  Uterus anteverted, normal size, shape and contour, midline and mobile nontender   Adnexa  Without masses or tenderness    Anus and perineum  Normal   Rectovaginal  Normal sphincter tone without palpated masses or tenderness.    Assessment/Plan:  48 y.o. G17P2002 female for annual exam with regular light menses, vasectomy birth control.  1. Regular light menses status post NovaSure endometrial ablation. Doing well. Continue to monitor. 2. Positional vertigo. Patient relates over the last several weeks whenever she gets out of bed or turns her head quickly she has some dizziness which quickly resolves. No other symptoms. Exam is normal. Recommend observation. If continues then followup with primary physician for further evaluation. 3. Pap smear 2011. Pap/HPV today. No history of abnormal Pap smears previously. 4. Mammography 09/2012. Plans 3-D  mammography this coming August. SBE monthly reviewed. Does have a history of breast cancer in her maternal grandmother and 2 maternal aunts. We previously discussed possible genetic linkage and she's reviewed this with her maternal aunts who refused to be tested. Offered patient testing and referral to genetic counselor. Implications if positive to include high risk of breast cancer and ovarian cancer and possible options to include risk reductive surgery is or increased surveillance such as MRIs. Patient declines testing or referral to a genetic counselor at this time. She's going to rediscuss with one of her aunts to see if she can get her to be tested. 5. Health maintenance. Routine CBC comprehensive metabolic panel lipid profile urinalysis TSH ordered. Followup one year, sooner as needed.   Note: This document was prepared with digital dictation and possible smart phrase technology. Any transcriptional errors that result from this process are unintentional.   [name] MD, 10:19 AM 08/10/2013

## 2013-08-11 LAB — URINALYSIS W MICROSCOPIC + REFLEX CULTURE
Bilirubin Urine: NEGATIVE
CASTS: NONE SEEN
Crystals: NONE SEEN
Glucose, UA: NEGATIVE mg/dL
HGB URINE DIPSTICK: NEGATIVE
KETONES UR: NEGATIVE mg/dL
NITRITE: NEGATIVE
PH: 8 (ref 5.0–8.0)
Protein, ur: NEGATIVE mg/dL
Specific Gravity, Urine: 1.021 (ref 1.005–1.030)
UROBILINOGEN UA: 0.2 mg/dL (ref 0.0–1.0)

## 2013-08-11 LAB — TSH: TSH: 1.799 u[IU]/mL (ref 0.350–4.500)

## 2013-08-11 LAB — CYTOLOGY - PAP

## 2013-08-12 LAB — URINE CULTURE: Colony Count: 80000

## 2013-08-16 ENCOUNTER — Other Ambulatory Visit: Payer: Self-pay | Admitting: Gynecology

## 2013-08-16 DIAGNOSIS — E7800 Pure hypercholesterolemia, unspecified: Secondary | ICD-10-CM

## 2013-11-14 ENCOUNTER — Other Ambulatory Visit: Payer: Self-pay

## 2013-11-14 DIAGNOSIS — Z1231 Encounter for screening mammogram for malignant neoplasm of breast: Secondary | ICD-10-CM

## 2013-12-01 ENCOUNTER — Other Ambulatory Visit: Payer: Self-pay

## 2013-12-13 ENCOUNTER — Ambulatory Visit
Admission: RE | Admit: 2013-12-13 | Discharge: 2013-12-13 | Disposition: A | Discharge status: Home / Self Care | Payer: BC Managed Care – PPO | Source: Ambulatory Visit

## 2013-12-13 DIAGNOSIS — Z1231 Encounter for screening mammogram for malignant neoplasm of breast: Secondary | ICD-10-CM

## 2013-12-18 ENCOUNTER — Encounter: Payer: Self-pay | Admitting: Gynecology

## 2013-12-20 ENCOUNTER — Encounter: Payer: Self-pay | Admitting: Gynecology

## 2013-12-21 NOTE — Telephone Encounter (Signed)
The recommendation is if you our calculated by various methods available to have a 20% or greater life risk of breast cancer they recommend annual MRIs. The problem is MRIs cost several thousand dollars and the insurance companies try hard not to pay for it. I would recommend following up with the genetic counselor where they will calculate out your risk by several different models and make a recommendation as far as MRI is concerned which would carry much more weight from an insurance reimbursement issue. They also will make a recommendation as far as genetic testing.Marland Kitchen

## 2014-02-16 HISTORY — PX: BREAST BIOPSY: SHX20

## 2014-08-15 ENCOUNTER — Ambulatory Visit (INDEPENDENT_AMBULATORY_CARE_PROVIDER_SITE_OTHER): Payer: 59 | Admitting: Gynecology

## 2014-08-15 ENCOUNTER — Encounter: Payer: Self-pay | Admitting: Gynecology

## 2014-08-15 ENCOUNTER — Telehealth: Payer: Self-pay | Admitting: *Deleted

## 2014-08-15 VITALS — BP 124/76 | Ht 61.0 in | Wt 191.0 lb

## 2014-08-15 DIAGNOSIS — N926 Irregular menstruation, unspecified: Secondary | ICD-10-CM

## 2014-08-15 DIAGNOSIS — Z01419 Encounter for gynecological examination (general) (routine) without abnormal findings: Secondary | ICD-10-CM | POA: Diagnosis not present

## 2014-08-15 LAB — CBC WITH DIFFERENTIAL/PLATELET
BASOS ABS: 0 10*3/uL (ref 0.0–0.1)
BASOS PCT: 0 % (ref 0–1)
EOS PCT: 1 % (ref 0–5)
Eosinophils Absolute: 0.1 10*3/uL (ref 0.0–0.7)
HCT: 41.8 % (ref 36.0–46.0)
Hemoglobin: 13.7 g/dL (ref 12.0–15.0)
Lymphocytes Relative: 38 % (ref 12–46)
Lymphs Abs: 3.8 10*3/uL (ref 0.7–4.0)
MCH: 29.2 pg (ref 26.0–34.0)
MCHC: 32.8 g/dL (ref 30.0–36.0)
MCV: 89.1 fL (ref 78.0–100.0)
MONO ABS: 0.4 10*3/uL (ref 0.1–1.0)
MPV: 9 fL (ref 8.6–12.4)
Monocytes Relative: 4 % (ref 3–12)
Neutro Abs: 5.7 10*3/uL (ref 1.7–7.7)
Neutrophils Relative %: 57 % (ref 43–77)
PLATELETS: 362 10*3/uL (ref 150–400)
RBC: 4.69 MIL/uL (ref 3.87–5.11)
RDW: 13.6 % (ref 11.5–15.5)
WBC: 10 10*3/uL (ref 4.0–10.5)

## 2014-08-15 LAB — COMPREHENSIVE METABOLIC PANEL
ALK PHOS: 98 U/L (ref 39–117)
ALT: 11 U/L (ref 0–35)
AST: 15 U/L (ref 0–37)
Albumin: 3.9 g/dL (ref 3.5–5.2)
BUN: 10 mg/dL (ref 6–23)
CALCIUM: 9.3 mg/dL (ref 8.4–10.5)
CO2: 28 mEq/L (ref 19–32)
Chloride: 100 mEq/L (ref 96–112)
Creat: 0.7 mg/dL (ref 0.50–1.10)
GLUCOSE: 89 mg/dL (ref 70–99)
POTASSIUM: 4.7 meq/L (ref 3.5–5.3)
SODIUM: 139 meq/L (ref 135–145)
Total Bilirubin: 0.7 mg/dL (ref 0.2–1.2)
Total Protein: 6.9 g/dL (ref 6.0–8.3)

## 2014-08-15 LAB — LIPID PANEL
Cholesterol: 208 mg/dL — ABNORMAL HIGH (ref 0–200)
HDL: 57 mg/dL (ref >=46)
LDL CALC: 127 mg/dL — AB (ref 0–99)
Total CHOL/HDL Ratio: 3.6 Ratio
Triglycerides: 122 mg/dL (ref <150)
VLDL: 24 mg/dL (ref 0–40)

## 2014-08-15 LAB — TSH: TSH: 1.904 u[IU]/mL (ref 0.350–4.500)

## 2014-08-15 MED ORDER — ALPRAZOLAM 0.5 MG PO TABS: 0.5000 mg | ORAL_TABLET | Freq: Every evening | ORAL | Status: DC | PRN

## 2014-08-15 NOTE — Patient Instructions (Signed)
Office will call you to arrange the genetic counseling. If you do not hear from them within 2 weeks call the office.  You may obtain a copy of any labs that were done today by logging onto MyChart as outlined in the instructions provided with your AVS (after visit summary). The office will not call with normal lab results but certainly if there are any significant abnormalities then we will contact you.   Health Maintenance, Female A healthy lifestyle and preventative care can promote health and wellness.  Maintain regular health, dental, and eye exams.  Eat a healthy diet. Foods like vegetables, fruits, whole grains, low-fat dairy products, and lean protein foods contain the nutrients you need without too many calories. Decrease your intake of foods high in solid fats, added sugars, and salt. Get information about a proper diet from your caregiver, if necessary.  Regular physical exercise is one of the most important things you can do for your health. Most adults should get at least 150 minutes of moderate-intensity exercise (any activity that increases your heart rate and causes you to sweat) each week. In addition, most adults need muscle-strengthening exercises on 2 or more days a week.   Maintain a healthy weight. The body mass index (BMI) is a screening tool to identify possible weight problems. It provides an estimate of body fat based on height and weight. Your caregiver can help determine your BMI, and can help you achieve or maintain a healthy weight. For adults 20 years and older:  A BMI below 18.5 is considered underweight.  A BMI of 18.5 to 24.9 is normal.  A BMI of 25 to 29.9 is considered overweight.  A BMI of 30 and above is considered obese.  Maintain normal blood lipids and cholesterol by exercising and minimizing your intake of saturated fat. Eat a balanced diet with plenty of fruits and vegetables. Blood tests for lipids and cholesterol should begin at age 57 and be  repeated every 5 years. If your lipid or cholesterol levels are high, you are over 50, or you are a high risk for heart disease, you may need your cholesterol levels checked more frequently.Ongoing high lipid and cholesterol levels should be treated with medicines if diet and exercise are not effective.  If you smoke, find out from your caregiver how to quit. If you do not use tobacco, do not start.  Lung cancer screening is recommended for adults aged 44 80 years who are at high risk for developing lung cancer because of a history of smoking. Yearly low-dose computed tomography (CT) is recommended for people who have at least a 30-pack-year history of smoking and are a current smoker or have quit within the past 15 years. A pack year of smoking is smoking an average of 1 pack of cigarettes a day for 1 year (for example: 1 pack a day for 30 years or 2 packs a day for 15 years). Yearly screening should continue until the smoker has stopped smoking for at least 15 years. Yearly screening should also be stopped for people who develop a health problem that would prevent them from having lung cancer treatment.  If you are pregnant, do not drink alcohol. If you are breastfeeding, be very cautious about drinking alcohol. If you are not pregnant and choose to drink alcohol, do not exceed 1 drink per day. One drink is considered to be 12 ounces (355 mL) of beer, 5 ounces (148 mL) of wine, or 1.5 ounces (44 mL) of liquor.  Avoid use of street drugs. Do not share needles with anyone. Ask for help if you need support or instructions about stopping the use of drugs.  High blood pressure causes heart disease and increases the risk of stroke. Blood pressure should be checked at least every 1 to 2 years. Ongoing high blood pressure should be treated with medicines, if weight loss and exercise are not effective.  If you are 44 to 47 years old, ask your caregiver if you should take aspirin to prevent strokes.  Diabetes  screening involves taking a blood sample to check your fasting blood sugar level. This should be done once every 3 years, after age 54, if you are within normal weight and without risk factors for diabetes. Testing should be considered at a younger age or be carried out more frequently if you are overweight and have at least 1 risk factor for diabetes.  Breast cancer screening is essential preventative care for women. You should practice "breast self-awareness." This means understanding the normal appearance and feel of your breasts and may include breast self-examination. Any changes detected, no matter how small, should be reported to a caregiver. Women in their 31s and 30s should have a clinical breast exam (CBE) by a caregiver as part of a regular health exam every 1 to 3 years. After age 12, women should have a CBE every year. Starting at age 70, women should consider having a mammogram (breast X-ray) every year. Women who have a family history of breast cancer should talk to their caregiver about genetic screening. Women at a high risk of breast cancer should talk to their caregiver about having an MRI and a mammogram every year.  Breast cancer gene (BRCA)-related cancer risk assessment is recommended for women who have family members with BRCA-related cancers. BRCA-related cancers include breast, ovarian, tubal, and peritoneal cancers. Having family members with these cancers may be associated with an increased risk for harmful changes (mutations) in the breast cancer genes BRCA1 and BRCA2. Results of the assessment will determine the need for genetic counseling and BRCA1 and BRCA2 testing.  The Pap test is a screening test for cervical cancer. Women should have a Pap test starting at age 35. Between ages 63 and 55, Pap tests should be repeated every 2 years. Beginning at age 24, you should have a Pap test every 3 years as long as the past 3 Pap tests have been normal. If you had a hysterectomy for a  problem that was not cancer or a condition that could lead to cancer, then you no longer need Pap tests. If you are between ages 62 and 39, and you have had normal Pap tests going back 10 years, you no longer need Pap tests. If you have had past treatment for cervical cancer or a condition that could lead to cancer, you need Pap tests and screening for cancer for at least 20 years after your treatment. If Pap tests have been discontinued, risk factors (such as a new sexual partner) need to be reassessed to determine if screening should be resumed. Some women have medical problems that increase the chance of getting cervical cancer. In these cases, your caregiver may recommend more frequent screening and Pap tests.  The human papillomavirus (HPV) test is an additional test that may be used for cervical cancer screening. The HPV test looks for the virus that can cause the cell changes on the cervix. The cells collected during the Pap test can be tested for HPV. The  HPV test could be used to screen women aged 30 years and older, and should be used in women of any age who have unclear Pap test results. After the age of 63, women should have HPV testing at the same frequency as a Pap test.  Colorectal cancer can be detected and often prevented. Most routine colorectal cancer screening begins at the age of 54 and continues through age 62. However, your caregiver may recommend screening at an earlier age if you have risk factors for colon cancer. On a yearly basis, your caregiver may provide home test kits to check for hidden blood in the stool. Use of a small camera at the end of a tube, to directly examine the colon (sigmoidoscopy or colonoscopy), can detect the earliest forms of colorectal cancer. Talk to your caregiver about this at age 31, when routine screening begins. Direct examination of the colon should be repeated every 5 to 10 years through age 42, unless early forms of pre-cancerous polyps or small growths  are found.  Hepatitis C blood testing is recommended for all people born from [birth year] through 1963 and any individual with known risks for hepatitis C.  Practice safe sex. Use condoms and avoid high-risk sexual practices to reduce the spread of sexually transmitted infections (STIs). Sexually active women aged 4 and younger should be checked for Chlamydia, which is a common sexually transmitted infection. Older women with new or multiple partners should also be tested for Chlamydia. Testing for other STIs is recommended if you are sexually active and at increased risk.  Osteoporosis is a disease in which the bones lose minerals and strength with aging. This can result in serious bone fractures. The risk of osteoporosis can be identified using a bone density scan. Women ages 28 and over and women at risk for fractures or osteoporosis should discuss screening with their caregivers. Ask your caregiver whether you should be taking a calcium supplement or vitamin D to reduce the rate of osteoporosis.  Menopause can be associated with physical symptoms and risks. Hormone replacement therapy is available to decrease symptoms and risks. You should talk to your caregiver about whether hormone replacement therapy is right for you.  Use sunscreen. Apply sunscreen liberally and repeatedly throughout the day. You should seek shade when your shadow is shorter than you. Protect yourself by wearing long sleeves, pants, a wide-brimmed hat, and sunglasses year round, whenever you are outdoors.  Notify your caregiver of new moles or changes in moles, especially if there is a change in shape or color. Also notify your caregiver if a mole is larger than the size of a pencil eraser.  Stay current with your immunizations. Document Released: 08/18/2010 Document Revised: 05/30/2012 Document Reviewed: 08/18/2010 [facility] Patient Information 2014 [place].

## 2014-08-15 NOTE — Telephone Encounter (Signed)
-----   Message from [name], MD sent at 08/15/2014 10:15 AM EDT ----- Appointment for genetic counseling at [place] due to family history of breast cancer.

## 2014-08-15 NOTE — Progress Notes (Signed)
LUDENE STOKKE 1965-11-15 [id number]        49 y.o.  G2P2002 for annual exam.  Several issues noted below.  Past medical history,surgical history, problem list, medications, allergies, family history and social history were all reviewed and documented as reviewed in the EPIC chart.  ROS:  Performed with pertinent positives and negatives included in the history, assessment and plan.   Additional significant findings :  none   Exam: Kim Counsellor Vitals:   08/15/14 0948  BP: 124/76  Height: 5\' 1"  (1.549 m)  Weight: 191 lb (86.637 kg)   General appearance:  Normal affect, orientation and appearance. Skin: Grossly normal HEENT: Without gross lesions.  No cervical or supraclavicular adenopathy. Thyroid normal.  Lungs:  Clear without wheezing, rales or rhonchi Cardiac: RR, without RMG Abdominal:  Soft, nontender, without masses, guarding, rebound, organomegaly or hernia Breasts:  Examined lying and sitting without masses, retractions, discharge or axillary adenopathy. Pelvic:  Ext/BUS/vagina normal  Cervix normal  Uterus anteverted, normal size, shape and contour, midline and mobile nontender   Adnexa  Without masses or tenderness    Anus and perineum  Normal   Rectovaginal  Normal sphincter tone without palpated masses or tenderness.    Assessment/Plan:  49 y.o. G69P2002 female for annual exam with irregular menses, vasectomybirth control.   1. Irregular menses. Patient having skips in her menses up to 6 months. Is status post NovaSure endometrial ablation. Is not having hot flashes night sweats or other symptoms. Will check baseline TSH FSH. Discussed if [place] normal need for progesterone challenge every other month to shed the lining. If [place] elevated then we'll monitor and follow up if prolonged or atypical bleeding or if menopausal symptoms present that she wants to discuss HRT. 2. Strong family history of cancer. Maternal grandmother and 2 maternal aunts. We've previously  discussed genetic testing and she declined but at this point she does agree to genetic counseling. We'll make arrangements for this. Patient knows if she does not hear from our office within several weeks to call. I again reviewed the implications if genetic positive to include significant increased breast cancer and ovarian cancer risks. Issues of increased surveillance or prophylactic surgeries reviewed. Mammography 11/2013. Continue with annual mammography. SBE monthly reviewed. 3. Pap smear/HPV negative 2015. No Pap smear done today. Plan repeat Pap smear at 3-5 year interval per current screening guidelines. 4. Health maintenance. Never followed up for fasting cholesterol previously. Is fasting today. CBC, comprehensive metabolic panel, lipid profile, urinalysis, TSH, FSH ordered. Follow up for results and decision about progesterone withdrawal. Follow up for genetic counseling. Follow up in one year for annual exam.   [name] MD, 10:12 AM 08/15/2014

## 2014-08-15 NOTE — Telephone Encounter (Signed)
Referral faxed to cancer center, they will contact pt to schedule.

## 2014-08-16 ENCOUNTER — Other Ambulatory Visit: Payer: Self-pay | Admitting: Gynecology

## 2014-08-16 LAB — URINALYSIS W MICROSCOPIC + REFLEX CULTURE
BACTERIA UA: NONE SEEN
BILIRUBIN URINE: NEGATIVE
CASTS: NONE SEEN
Crystals: NONE SEEN
Glucose, UA: NEGATIVE mg/dL
HGB URINE DIPSTICK: NEGATIVE
KETONES UR: NEGATIVE mg/dL
Leukocytes, UA: NEGATIVE
Nitrite: NEGATIVE
PH: 7.5 (ref 5.0–8.0)
PROTEIN: NEGATIVE mg/dL
Specific Gravity, Urine: 1.015 (ref 1.005–1.030)
UROBILINOGEN UA: 0.2 mg/dL (ref 0.0–1.0)

## 2014-08-16 LAB — FOLLICLE STIMULATING HORMONE: FSH: 6.3 m[IU]/mL

## 2014-08-16 MED ORDER — MEDROXYPROGESTERONE ACETATE 10 MG PO TABS: 10.0000 mg | ORAL_TABLET | Freq: Every day | ORAL | Status: DC

## 2014-08-22 ENCOUNTER — Telehealth: Payer: Self-pay | Admitting: Genetic Counselor

## 2014-08-22 NOTE — Telephone Encounter (Signed)
LEFT MESSAGE REGARDING GEN COUNSELING APPT  DX: GENETIC COUSELING- FAMILY HX BREAST CANCER REFERRING: DR. Phineas Real

## 2014-08-29 NOTE — Telephone Encounter (Signed)
Appointment 09/05/14 @ 1:00pm

## 2014-09-05 ENCOUNTER — Encounter: Payer: Self-pay | Admitting: Genetic Counselor

## 2014-09-05 ENCOUNTER — Ambulatory Visit (HOSPITAL_BASED_OUTPATIENT_CLINIC_OR_DEPARTMENT_OTHER): Payer: 59 | Admitting: Genetic Counselor

## 2014-09-05 ENCOUNTER — Other Ambulatory Visit: Payer: 59

## 2014-09-05 DIAGNOSIS — Z315 Encounter for genetic counseling: Secondary | ICD-10-CM

## 2014-09-05 DIAGNOSIS — Z808 Family history of malignant neoplasm of other organs or systems: Secondary | ICD-10-CM | POA: Diagnosis not present

## 2014-09-05 DIAGNOSIS — Z803 Family history of malignant neoplasm of breast: Secondary | ICD-10-CM | POA: Diagnosis not present

## 2014-09-05 NOTE — Progress Notes (Signed)
REFERRING PROVIDER: Lona Kettle, MD [place], [place] [address]   Donalynn Furlong, MD  PRIMARY PROVIDER:   Melinda Crutch, MD  PRIMARY REASON FOR VISIT:  1. Family history of breast cancer   2. Family history of thyroid cancer      HISTORY OF PRESENT ILLNESS:   [name], a 49 y.o. female, was seen for a [place] cancer genetics consultation at the request of Dr. Phineas Real due to a family history of cancer.  [name] presents to clinic today to discuss the possibility of a hereditary predisposition to cancer, genetic testing, and to further clarify her future cancer risks, as well as potential cancer risks for family members. [name] is a 49 y.o. female with no personal history of cancer.  She reports that she has approached other family members to do genetic testing and they refused due to costs.  CANCER HISTORY:   No history exists.     HORMONAL RISK FACTORS:  Menarche was at age 8.  First live birth at age 38.  OCP use for approximately 5 years.  Ovaries intact: yes.  Hysterectomy: no.  Menopausal status: premenopausal.  HRT use: 0 years. Colonoscopy: no; not examined. Mammogram within the last year: yes. Number of breast biopsies: 0. Up to date with pelvic exams:  yes. Any excessive radiation exposure in the past:  no  Past Medical History  Diagnosis Date  . Endometriosis   . Family history of breast cancer   . Family history of thyroid cancer     Past Surgical History  Procedure Laterality Date  . Novasure ablation  10/2004  . Pelvic laparoscopy  12/2002    endometriosis  . Foot surgery    . Endometrial ablation      History   Social History  . Marital Status: Married    Spouse Name: Elberta Fortis  . Number of Children: 2  . Years of Education: N/A   Social History Main Topics  . Smoking status: Never Smoker   . Smokeless tobacco: Never Used  . Alcohol Use: 0.0 oz/week    0 Standard drinks or equivalent per week     Comment:  1-2 week  . Drug Use: No  . Sexual Activity: Yes    Birth Control/ Protection: Other-see comments     Comment: vasectomy-1st intercourse 24 yo-5 partners   Other Topics Concern  . None   Social History Narrative     FAMILY HISTORY:  We obtained a detailed, 4-generation family history.  Significant diagnoses are listed below: Family History  Problem Relation Age of Onset  . Hypertension Father   . Breast cancer Maternal Aunt 67    mother's identical twin  . Cancer Maternal Grandfather     lung  . Diabetes Paternal Grandmother   . Breast cancer Maternal Grandmother     dx in her late 83s to early 23s  . Breast cancer Maternal Aunt 49    second cancer at  72  . Leukemia Paternal Grandfather   . Thyroid cancer Sister     dx in her early to mid 29s  . Breast cancer Paternal Aunt 86   The patient has a son and daughter who are healthy.  She has four sisters, the oldest had thryoid cancer in her 48s.  Her mother is healthy, and had a TAH-BSO in her 60s due to bleeding.  Her mother's identical twin sister was diagnosed with breast cancer at 55 and another sister diagnosed with breast cancer  at 12 and 66.  Her brother is alive and cancer free.  The patient's maternal grandmother was daignosed with breast cancer at 60.  [name]'s father is alive and cancer free.  He had five siblings, one sister had breast cancer at age 8.  No other cancer history was reported.   Patient's maternal ancestors are of Caucasian descent, and paternal ancestors are of Caucasian descent. There is no reported Ashkenazi Jewish ancestry. There is no known consanguinity.  GENETIC COUNSELING ASSESSMENT: GYANNA JAREMA is a 49 y.o. female with a family history of breast and thyroid cancer which somewhat suggestive of a hereditary cancer syndrome and predisposition to cancer. We, therefore, discussed and recommended the following at today's visit.   DISCUSSION: We reviewed the characteristics, features and  inheritance patterns of hereditary cancer syndromes. We discussed hereditary cancer syndromes including BRCA mutations, as well as PTEN mutations based on the breast and thyroid cancers. We also discussed genetic testing, including the appropriate family members to test, the process of testing, insurance coverage and turn-around-time for results. Based on [name] family history, the best people to test would be her maternal aunts, more specifically her aunt Baker Janus who was dx at age 58. [name] mentioned that she had approached both aunts about testing and they refused due to costs.  We discussed the implications of a negative, positive and/or variant of uncertain significant result. We recommended [name] pursue genetic testing for the Breast/Ovarian cancer gene panel. The Breast/Ovarian gene panel offered by [facility] includes sequencing and rearrangement analysis for the following 20 genes:  ATM, BARD1, BRCA1, BRCA2, BRIP1, CDH1, CHEK2, EPCAM, FANCC, MLH1, MSH2, MSH6, NBN, PALB2, PMS2, PTEN, RAD51C, RAD51D, TP53, and XRCC2.     Based on [name]'s personal and family history of cancer, she meets medical criteria for genetic testing. Despite that she meets criteria, she may still have an out of pocket cost. We discussed that if her out of pocket cost for testing is over $100, the laboratory will call and confirm whether she wants to proceed with testing.  If the out of pocket cost of testing is less than $100 she will be billed by the genetic testing laboratory.   In order to estimate her chance of having a BRCA mutation, we used statistical models (Tyrer Cusik, PENNII and Myriad risk calculator) and laboratory data that take into account her personal medical history, family history and ancestry.  Because each model is different, there can be a lot of variability in the risks they give.  Therefore, these numbers must be considered a rough range and not a precise risk of having a BRCA mutation.   These models estimate that she has approximately a 0.63-5% chance of having a mutation. Based on this assessment of her family and personal history, genetic testing is recommended.  Based on the patient's personal and family history, statistical models (Tyrer Cusik)  and literature data were used to estimate her risk of developing breast cancer. These estimate her lifetime risk of developing breast cancer to be approximately 18.2% to 23.9%. This estimation does not take into account any genetic testing results, it also is unable to calculate properly based on the twinning between an affected aunt and the patient's unaffected mother.  The patient's lifetime breast cancer risk is a preliminary estimate based on available information using one of several models endorsed by the [place] (ACS). The ACS recommends consideration of breast MRI screening as an adjunct to mammography for patients at  high risk (defined as 20% or greater lifetime risk). A more detailed breast cancer risk assessment can be considered, if clinically indicated.   Ms. Krass has been determined to potentially be at high risk for breast cancer.  Therefore, we recommend that annual screening with mammography and breast MRI begin at age 31, or 10 years prior to the age of breast cancer diagnosis in a relative (whichever is earlier).  We discussed that Ms. Renfrew should discuss her individual situation with her referring physician and determine a breast cancer screening plan with which they are both comfortable.    PLAN: After considering the risks, benefits, and limitations, Ms. Heying  provided informed consent to pursue genetic testing and the blood sample was sent to [facility] for analysis of the Breast/Ovarian Cancer panel. Results should be available within approximately 2-3 weeks' time, at which point they will be disclosed by telephone to Ms. Wolfer, as will any additional recommendations warranted by  these results. Ms. Dixson will receive a summary of her genetic counseling visit and a copy of her results once available. This information will also be available in Epic. We encouraged Ms. Zollars to remain in contact with cancer genetics annually so that we can continuously update the family history and inform her of any changes in cancer genetics and testing that may be of benefit for her family. Ms. Saephanh's questions were answered to her satisfaction today. Our contact information was provided should additional questions or concerns arise.  Based on Ms. Meleski's family history, we recommended her maternal aunts, who were diagnosed with breast cancer at age 59 and 14, have genetic counseling and testing. Ms. Siebers will let us know if we can be of any assistance in coordinating genetic counseling and/or testing for this family member.   Lastly, we encouraged Ms. Studstill to remain in contact with cancer genetics annually so that we can continuously update the family history and inform her of any changes in cancer genetics and testing that may be of benefit for this family.   Ms.  Nop's questions were answered to her satisfaction today. Our contact information was provided should additional questions or concerns arise. Thank you for the referral and allowing [place] to share in the care of your patient.   Thaison Kolodziejski P. Florene Glen, [place], [facility] Certified Genetic Counselor [email or web address] phone: [phone]  The patient was seen for a total of 45 minutes in face-to-face genetic counseling.  This patient was discussed with Drs. Magrinat, Lindi Adie and/or Burr Medico who agrees with the above.    _______________________________________________________________________ For Office Staff:  Number of people involved in session: 1 Was an Intern/ student involved with case: no

## 2014-09-10 ENCOUNTER — Telehealth: Payer: Self-pay | Admitting: Gynecology

## 2014-09-10 DIAGNOSIS — Z803 Family history of malignant neoplasm of breast: Secondary | ICD-10-CM

## 2014-09-10 NOTE — Telephone Encounter (Signed)
Pt informed with the below note, order placed she will contact [facility] to schedule to answer question needed to schedule.

## 2014-09-10 NOTE — Telephone Encounter (Signed)
Tell patient I received a report from the genetic counselor. Because they calculated her risk of breast cancer based on her family history is 18-23% they recommended annual mammography and annual MRI of the breast. If she is interested in pursuing the MRI then schedule this for her.

## 2014-09-17 DIAGNOSIS — Z1509 Genetic susceptibility to other malignant neoplasm: Secondary | ICD-10-CM

## 2014-09-17 DIAGNOSIS — Z1501 Genetic susceptibility to malignant neoplasm of breast: Secondary | ICD-10-CM

## 2014-09-17 HISTORY — DX: Genetic susceptibility to malignant neoplasm of breast: Z15.01

## 2014-09-17 HISTORY — DX: Genetic susceptibility to other malignant neoplasm: Z15.09

## 2014-09-21 ENCOUNTER — Ambulatory Visit: Payer: Self-pay | Admitting: Genetic Counselor

## 2014-09-21 ENCOUNTER — Telehealth: Payer: Self-pay | Admitting: Genetic Counselor

## 2014-09-21 ENCOUNTER — Encounter: Payer: Self-pay | Admitting: Genetic Counselor

## 2014-09-21 DIAGNOSIS — Z1379 Encounter for other screening for genetic and chromosomal anomalies: Secondary | ICD-10-CM | POA: Insufficient documentation

## 2014-09-21 DIAGNOSIS — Z803 Family history of malignant neoplasm of breast: Secondary | ICD-10-CM

## 2014-09-21 DIAGNOSIS — Z808 Family history of malignant neoplasm of other organs or systems: Secondary | ICD-10-CM

## 2014-09-21 NOTE — Telephone Encounter (Signed)
LM on VM at home and cell with good news on test results and to please call back.

## 2014-09-21 NOTE — Telephone Encounter (Signed)
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 Discussed testing someone in the family would be most informative for her.  VUS will be followed by lab and we will notify her if it is reclassified.

## 2014-09-21 NOTE — Progress Notes (Addendum)
HPI: Ms. Greenhouse was previously seen in the [place] clinic due to a family history of cancer and concerns regarding a hereditary predisposition to cancer. Please refer to our prior cancer genetics clinic note for more information regarding Ms. Weedon's medical, social and family histories, and our assessment and recommendations, at the time. Ms. Godley's recent genetic test results were disclosed to her, as were recommendations warranted by these results. These results and recommendations are discussed in more detail below.  GENETIC TEST RESULTS: At the time of Ms. Labarre's visit, we recommended she pursue genetic testing of the Breast/Ovarian cancer gene panel. The Breast/Ovarian gene panel offered by [facility] includes sequencing and rearrangement analysis for the following 20 genes:  ATM, BARD1, BRCA1, BRCA2, BRIP1, CDH1, CHEK2, EPCAM, FANCC, MLH1, MSH2, MSH6, NBN, PALB2, PMS2, PTEN, RAD51C, RAD51D, TP53, and XRCC2.   The report date is September 18, 2014.  Genetic testing was normal, and did not reveal a deleterious mutation in these genes. The test report has been scanned into EPIC and is located under the Media tab. Ms. Herbst will receive a copy of her test result via secure email.  We discussed with Ms. Delucchi that since the current genetic testing is not perfect, it is possible there may be a gene mutation in one of these genes that current testing cannot detect, but that chance is small. We also discussed, that it is possible that another gene that has not yet been discovered, or that we have not yet tested, is responsible for the cancer diagnoses in the family, and it is, therefore, important to remain in touch with cancer genetics in the future so that we can continue to offer Ms. Bibb the most up to date genetic testing.   Genetic testing did detect a Variant of Unknown Significance in the ATM gene called c.[id number]>G. At this time, it is unknown if this variant is  associated with increased cancer risk or if this is a normal finding, but most variants such as this get reclassified to being inconsequential. It should not be used to make medical management decisions. With time, we suspect the lab will determine the significance of this variant, if any. If we do learn more about it, we will try to contact Ms. Iott to discuss it further. However, it is important to stay in touch with [place] periodically and keep the address and phone number up to date.   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  The updated report date is April 04, 2019.  CANCER SCREENING RECOMMENDATIONS: Given Ms. Snuffer's personal and family histories, we must interpret these negative results with some caution.  Families with features suggestive of hereditary risk for cancer tend to have multiple family members with cancer, diagnoses in multiple generations and diagnoses before the age of 80. Ms. Bolanos's family exhibits some of these features. Thus this result may simply reflect our current inability to detect all mutations within these genes or there may be a different gene that has not yet been discovered or tested.   Based on the Ms. Kneece's personal and family history of cancer, as well as her genetic test results, statistical models (Tyrer Cusik)  and literature data were used to estimate her risk of developing breast. This estimates her lifetime risk of developing breast cancer to be approximately 18.2% to 23.9%.  The patient's lifetime breast cancer  risk is a preliminary estimate based on available information using one of several models endorsed by the [place] (ACS). The ACS recommends consideration of breast MRI screening as an adjunct to mammography for patients at high risk (defined as 20% or greater lifetime risk).  A more detailed breast cancer risk assessment can be considered, if clinically indicated.   RECOMMENDATIONS FOR FAMILY MEMBERS: Women in this family might be at some increased risk of developing cancer, over the general population risk, simply due to the family history of cancer. We recommended women in this family have a yearly mammogram beginning at age 49, or 71 years younger than the earliest onset of cancer, an an annual clinical breast exam, and perform monthly breast self-exams. Women in this family should also have a gynecological exam as recommended by their primary provider. All family members should have a colonoscopy by age 40.  Based on Ms. Cavanaugh's family history, we recommended her maternal aunt, who was diagnosed with breast cancer at age 80, have genetic counseling and testing. Ms. Schepp will let us know if we can be of any assistance in coordinating genetic counseling and/or testing for this family member.   FOLLOW-UP: Lastly, we discussed with Ms. Goertz that cancer genetics is a rapidly advancing field and it is possible that new genetic tests will be appropriate for her and/or her family members in the future. We encouraged her to remain in contact with cancer genetics on an annual basis so we can update her personal and family histories and let her know of advances in cancer genetics that may benefit this family.   Our contact number was provided. Ms. Bloodgood's questions were answered to her satisfaction, and she knows she is welcome to call us at anytime with additional questions or concerns.   Roma Kayser, MS, [facility] Certified Genetic Counselor [email or web address]

## 2014-10-01 ENCOUNTER — Encounter (HOSPITAL_COMMUNITY): Payer: Self-pay

## 2014-10-18 ENCOUNTER — Encounter (HOSPITAL_COMMUNITY): Payer: Self-pay

## 2014-10-18 ENCOUNTER — Encounter: Payer: Self-pay | Admitting: Gynecology

## 2014-10-26 ENCOUNTER — Telehealth: Payer: Self-pay | Admitting: Gynecology

## 2014-10-26 NOTE — Telephone Encounter (Signed)
I called patient in follow up of her genetic counseling. She has an ATM mutation of unknown significance. Genetic counselors did not recommend any specific treatment in reference to that. They did calculate her lifetime risk of breast cancer based on her history between 18-23%. The options for annual MRI screening per ACS recommendations discussed with the patient. She actually had this scheduled through our office but canceled it. She wants to think about whether she wants to pursue this and will call back if she wants to and we will schedule this for her.

## 2014-11-21 ENCOUNTER — Encounter (HOSPITAL_COMMUNITY): Payer: Self-pay

## 2014-11-28 ENCOUNTER — Other Ambulatory Visit: Payer: Self-pay

## 2014-11-28 DIAGNOSIS — Z1231 Encounter for screening mammogram for malignant neoplasm of breast: Secondary | ICD-10-CM

## 2015-01-08 ENCOUNTER — Ambulatory Visit
Admission: RE | Admit: 2015-01-08 | Discharge: 2015-01-08 | Disposition: A | Discharge status: Home / Self Care | Payer: 59 | Source: Ambulatory Visit

## 2015-01-08 DIAGNOSIS — Z1231 Encounter for screening mammogram for malignant neoplasm of breast: Secondary | ICD-10-CM

## 2015-01-15 ENCOUNTER — Other Ambulatory Visit: Payer: Self-pay | Admitting: Gynecology

## 2015-01-15 DIAGNOSIS — R928 Other abnormal and inconclusive findings on diagnostic imaging of breast: Secondary | ICD-10-CM

## 2015-01-21 ENCOUNTER — Ambulatory Visit
Admission: RE | Admit: 2015-01-21 | Discharge: 2015-01-21 | Disposition: A | Discharge status: Home / Self Care | Payer: 59 | Source: Ambulatory Visit | Attending: Gynecology | Admitting: Gynecology

## 2015-01-21 ENCOUNTER — Other Ambulatory Visit: Payer: Self-pay | Admitting: Gynecology

## 2015-01-21 DIAGNOSIS — R928 Other abnormal and inconclusive findings on diagnostic imaging of breast: Secondary | ICD-10-CM

## 2015-01-24 ENCOUNTER — Other Ambulatory Visit: Payer: Self-pay | Admitting: Gynecology

## 2015-01-24 DIAGNOSIS — R928 Other abnormal and inconclusive findings on diagnostic imaging of breast: Secondary | ICD-10-CM

## 2015-01-25 ENCOUNTER — Ambulatory Visit
Admission: RE | Admit: 2015-01-25 | Discharge: 2015-01-25 | Disposition: A | Discharge status: Home / Self Care | Payer: 59 | Source: Ambulatory Visit | Attending: Gynecology | Admitting: Gynecology

## 2015-01-25 ENCOUNTER — Other Ambulatory Visit: Payer: Self-pay | Admitting: Gynecology

## 2015-01-25 DIAGNOSIS — R928 Other abnormal and inconclusive findings on diagnostic imaging of breast: Secondary | ICD-10-CM

## 2015-02-01 ENCOUNTER — Encounter: Payer: Self-pay | Admitting: Gynecology

## 2015-09-26 ENCOUNTER — Encounter (HOSPITAL_COMMUNITY): Payer: Self-pay | Admitting: Emergency Medicine

## 2015-09-26 ENCOUNTER — Emergency Department (HOSPITAL_COMMUNITY): Payer: 59

## 2015-09-26 DIAGNOSIS — R51 Headache: Secondary | ICD-10-CM | POA: Insufficient documentation

## 2015-09-26 DIAGNOSIS — R079 Chest pain, unspecified: Secondary | ICD-10-CM | POA: Diagnosis present

## 2015-09-26 LAB — CBC
HEMATOCRIT: 40.4 % (ref 36.0–46.0)
Hemoglobin: 13.1 g/dL (ref 12.0–15.0)
MCH: 29.5 pg (ref 26.0–34.0)
MCHC: 32.4 g/dL (ref 30.0–36.0)
MCV: 91 fL (ref 78.0–100.0)
Platelets: 313 10*3/uL (ref 150–400)
RBC: 4.44 MIL/uL (ref 3.87–5.11)
RDW: 13.5 % (ref 11.5–15.5)
WBC: 14.9 10*3/uL — ABNORMAL HIGH (ref 4.0–10.5)

## 2015-09-26 LAB — BASIC METABOLIC PANEL
Anion gap: 9 (ref 5–15)
BUN: 10 mg/dL (ref 6–20)
CHLORIDE: 102 mmol/L (ref 101–111)
CO2: 26 mmol/L (ref 22–32)
Calcium: 9.2 mg/dL (ref 8.9–10.3)
Creatinine, Ser: 0.78 mg/dL (ref 0.44–1.00)
GFR calc Af Amer: >60 mL/min (ref >60)
GFR calc non Af Amer: >60 mL/min (ref >60)
GLUCOSE: 100 mg/dL — AB (ref 65–99)
POTASSIUM: 3.5 mmol/L (ref 3.5–5.1)
Sodium: 137 mmol/L (ref 135–145)

## 2015-09-26 LAB — I-STAT TROPONIN, ED: Troponin i, poc: 0.01 ng/mL (ref 0.00–0.08)

## 2015-09-26 NOTE — ED Triage Notes (Signed)
Pt. reports intermittent central chest pain radiating to left arm and upper back  with headache / lightheaded and  dry cough onset yesterday .

## 2015-09-27 ENCOUNTER — Emergency Department (HOSPITAL_COMMUNITY): Payer: 59

## 2015-09-27 ENCOUNTER — Emergency Department (HOSPITAL_COMMUNITY)
Admission: EM | Admit: 2015-09-27 | Discharge: 2015-09-27 | Disposition: A | Discharge status: Home / Self Care | Payer: 59 | Attending: Emergency Medicine | Admitting: Emergency Medicine

## 2015-09-27 DIAGNOSIS — R079 Chest pain, unspecified: Secondary | ICD-10-CM

## 2015-09-27 LAB — I-STAT BETA HCG BLOOD, ED (MC, WL, AP ONLY)

## 2015-09-27 LAB — I-STAT TROPONIN, ED: TROPONIN I, POC: 0 ng/mL (ref 0.00–0.08)

## 2015-09-27 MED ORDER — IOPAMIDOL (ISOVUE-370) INJECTION 76%
INTRAVENOUS | Status: AC
  Administered 2015-09-27: 100 mL
  Filled 2015-09-27: qty 100

## 2015-09-27 NOTE — ED Notes (Signed)
Patient states pain is 6/10. Witnessed patient arching back and pushing on chest in pain. States it "originated from shoulder blades and feels better if I push on my sternum" Patient c/o bilateral leg swelling x 2 weeks. Husband states patient drove back from [place] yesterday and pain started at home at 2030 last night. States left harm "feels like it's asleep, like pins and needles." Denies injury.

## 2015-09-27 NOTE — ED Provider Notes (Signed)
[facility] DEPT Provider Note   CSN: [id number] Arrival date & time: 09/26/15  2233  By signing my name below, I, Angela Ramsey, attest that this documentation has been prepared under the direction and in the presence of Gareth Morgan, MD. Electronically Signed: Irene Ramsey, ED Scribe. 09/27/15. 3:38 AM.  First Provider Contact:  None    History   Chief Complaint Chief Complaint  Patient presents with  . Chest Pain   The history is provided by the patient. No language interpreter was used.   HPI Comments: Angela Ramsey is a 50 y.o. female who presents to the Emergency Department complaining of gradually worsening, intermittent, dull, aching central chest pain that radiates to the left arm and upper back onset 7 hours ago. She rates her pain 8/10 at its worst with episodes of pain lasting about 10 seconds each. Pt reports associated headache, lightheadedness, left arm tingling, and dry cough. She states that it feels like her arm is asleep. Pt also drove back from [place] yesterday and began to have the chest pain after.  Pt has been complaining of bilateral leg swelling onset 2 weeks ago. She reports worsening pain with ambulation. She denies hx of similar symptoms, fall, hitting head, injury, palpitations, nausea, vomiting, abdominal pain, diaphoresis, numbness, weakness, or SOB. Pt does not use drugs or smoke.   Past Medical History:  Diagnosis Date  . Endometriosis   . Family history of breast cancer   . Family history of thyroid cancer   . 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 ATM mutation is a variant of unknown significance. No specific recommendations for follow up based on this made.    Patient Active Problem List   Diagnosis Date Noted  . Genetic testing 09/21/2014  . Family history of breast cancer   . Family history of thyroid cancer   . Endometriosis     Past Surgical History:  Procedure Laterality Date    . ENDOMETRIAL ABLATION    . FOOT SURGERY    . NOVASURE ABLATION  10/2004  . PELVIC LAPAROSCOPY  12/2002   endometriosis    OB History    Gravida Para Term Preterm AB Living   _0 SAB TAB Ectopic Multiple Live Births                   Home Medications    Prior to Admission medications   Medication Sig Start Date End Date Taking? Authorizing Provider  ALPRAZolam Duanne Moron) 0.5 MG tablet Take 1 tablet (0.5 mg total) by mouth at bedtime as needed. 08/15/14  Yes [name], MD  medroxyPROGESTERone (PROVERA) 10 MG tablet Take 1 tablet (10 mg total) by mouth daily. Patient not taking: Reported on 09/27/2015 08/16/14   Terrance Mass, MD    Family History Family History  Problem Relation Age of Onset  . Hypertension Father   . Breast cancer Maternal Aunt 67    mother's identical twin  . Cancer Maternal Grandfather     lung  . Diabetes Paternal Grandmother   . Breast cancer Maternal Grandmother     dx in her late 32s to early 36s  . Breast cancer Maternal Aunt 49    second cancer at  49  . Leukemia Paternal Grandfather   . Thyroid cancer Sister     dx in her early to mid 67s  . Breast cancer Paternal Aunt 83  Social History Social History  Substance Use Topics  . Smoking status: Never Smoker  . Smokeless tobacco: Never Used  . Alcohol use 0.0 oz/week     Comment: 1-2 week     Allergies   Review of patient's allergies indicates no known allergies.   Review of Systems Review of Systems  Constitutional: Negative for diaphoresis and fever.  HENT: Negative for sore throat.   Eyes: Negative for visual disturbance.  Respiratory: Negative for cough and shortness of breath.   Cardiovascular: Positive for chest pain and leg swelling. Negative for palpitations.  Gastrointestinal: Negative for abdominal pain, nausea and vomiting.  Genitourinary: Negative for difficulty urinating.  Musculoskeletal: Negative for back pain and neck pain.  Skin: Negative  for rash.  Neurological: Positive for headaches. Negative for syncope, weakness and numbness.  All other systems reviewed and are negative.    Physical Exam Updated Vital Signs BP 118/66   Pulse 67   Temp 97.8 F (36.6 C) (Oral)   Resp 23   Ht 5' (1.524 m)   Wt 190 lb (86.2 kg)   LMP 09/12/2015 (Approximate)   SpO2 95%   BMI 37.11 kg/m   Physical Exam  Constitutional: She is oriented to person, place, and time. She appears well-developed and well-nourished. No distress.  HENT:  Head: Normocephalic and atraumatic.  Eyes: Conjunctivae and EOM are normal. Pupils are equal, round, and reactive to light.  Neck: Normal range of motion. Neck supple.  Cardiovascular: Normal rate, regular rhythm, normal heart sounds and intact distal pulses.  Exam reveals no gallop and no friction rub.   No murmur heard. Pulmonary/Chest: Effort normal and breath sounds normal. No respiratory distress. She has no wheezes. She has no rales.  Abdominal: Soft. She exhibits no distension. There is no tenderness. There is no guarding.  Musculoskeletal: Normal range of motion. She exhibits no edema or tenderness.  Neurological: She is alert and oriented to person, place, and time. She has normal strength. No cranial nerve deficit or sensory deficit. Coordination normal. GCS eye subscore is 4. GCS verbal subscore is 5. GCS motor subscore is 6.  Skin: Skin is warm and dry. No rash noted. She is not diaphoretic. No erythema.  Psychiatric: She has a normal mood and affect. Her behavior is normal.  Nursing note and vitals reviewed.    ED Treatments / Results  DIAGNOSTIC STUDIES: Oxygen Saturation is 100% on RA, normal by my interpretation.    COORDINATION OF CARE: 3:32 AM-Discussed treatment plan which includes labs, EKG, and x-ray with pt at bedside and pt agreed to plan.    Labs (all labs ordered are listed, but only abnormal results are displayed) Labs Reviewed  BASIC METABOLIC PANEL - Abnormal;  Notable for the following:       Result Value   Glucose, Bld 100 (*)    All other components within normal limits  CBC - Abnormal; Notable for the following:    WBC 14.9 (*)    All other components within normal limits  I-STAT TROPOININ, ED  I-STAT BETA HCG BLOOD, ED ([facility], [facility], [facility] ONLY)  I-STAT TROPOININ, ED    EKG  EKG Interpretation  Date/Time:  Thursday September 26 2015 22:39:16 EDT Ventricular Rate:  77 PR Interval:  148 QRS Duration: 78 QT Interval:  394 QTC Calculation: 445 R Axis:   25 Text Interpretation:  Normal sinus rhythm with sinus arrhythmia Normal ECG No significant change since last tracing Confirmed by [facility] MD, Earlene Bjelland ([id number]) on 09/27/2015 3:16:39  AM Also confirmed by [facility] MD, Debbie Bellucci ([id number]), editor [id number] [place], Leda Gauze [phone])  on 09/27/2015 7:20:14 AM       Radiology Dg Chest 2 View  Result Date: 09/26/2015 CLINICAL DATA:  Acute chest pain and cough. EXAM: CHEST  2 VIEW COMPARISON:  06/14/2008 FINDINGS: Upper limits normal heart size noted. There is no evidence of focal airspace disease, pulmonary edema, suspicious pulmonary nodule/mass, pleural effusion, or pneumothorax. No acute bony abnormalities are identified. IMPRESSION: No active cardiopulmonary disease. Electronically Signed   By: Margarette Canada M.D.   On: 09/26/2015 23:27   Ct Head Wo Contrast  Result Date: 09/27/2015 CLINICAL DATA:  Acute onset of left arm tingling. Initial encounter. EXAM: CT HEAD WITHOUT CONTRAST TECHNIQUE: Contiguous axial images were obtained from the base of the skull through the vertex without intravenous contrast. COMPARISON:  CT of the head performed 12/29/2004, and MRI of the brain performed 01/30/2012 FINDINGS: There is no evidence of acute infarction, mass lesion, or intra- or extra-axial hemorrhage on CT. The posterior fossa, including the cerebellum, brainstem and fourth ventricle, is within normal limits. The third and lateral ventricles, and basal ganglia are unremarkable in  appearance. The cerebral hemispheres are symmetric in appearance, with normal gray-white differentiation. No mass effect or midline shift is seen. There is no evidence of fracture; visualized osseous structures are unremarkable in appearance. The orbits are within normal limits. The paranasal sinuses and mastoid air cells are well-aerated. No significant soft tissue abnormalities are seen. IMPRESSION: Unremarkable noncontrast CT of the head. Electronically Signed   By: Garald Balding M.D.   On: 09/27/2015 04:55   Ct Angio Chest Aorta W And/or Wo Contrast  Result Date: 09/27/2015 CLINICAL DATA:  Acute onset of generalized chest pain, radiating to the back. Left arm tingling. Initial encounter. EXAM: CT ANGIOGRAPHY CHEST WITH CONTRAST TECHNIQUE: Multidetector CT imaging of the chest was performed using the standard protocol during bolus administration of intravenous contrast. Multiplanar CT image reconstructions and MIPs were obtained to evaluate the vascular anatomy. CONTRAST:  78 mL of Isovue 300 IV contrast COMPARISON:  Chest radiograph performed 09/26/2015 FINDINGS: There is no evidence of aortic dissection. There is no evidence of aneurysmal dilatation. No calcific atherosclerotic disease is seen. The proximal great vessels are grossly unremarkable in appearance. There is no evidence of significant pulmonary embolus. Minimal bilateral atelectasis is noted. The lungs are otherwise clear. There is no evidence of significant focal consolidation, pleural effusion or pneumothorax. No masses are identified; no abnormal focal contrast enhancement is seen. Mild left atrial enlargement is noted. No mediastinal lymphadenopathy is seen. No pericardial effusion is identified. No axillary lymphadenopathy is seen. The visualized portions of the thyroid gland are unremarkable in appearance. The visualized portions of the liver and spleen are unremarkable. The visualized portions of the pancreas, gallbladder, stomach,  adrenal glands and kidneys are within normal limits. No acute osseous abnormalities are seen. Review of the MIP images confirms the above findings. IMPRESSION: 1. No evidence of aortic dissection. No evidence of aneurysmal dilatation. No calcific atherosclerotic disease seen. 2. No evidence of significant pulmonary embolus. 3. Minimal bilateral atelectasis noted.  Lungs otherwise clear. 4. Mild left atrial enlargement noted. Electronically Signed   By: Garald Balding M.D.   On: 09/27/2015 05:20    Procedures Procedures (including critical care time)  Medications Ordered in ED Medications  iopamidol (ISOVUE-370) 76 % injection (100 mLs  Contrast Given 09/27/15 0504)     Initial Impression / Assessment and Plan / ED Course  I have reviewed the triage vital signs and the nursing notes.  Pertinent labs & imaging results that were available during my care of the patient were reviewed by me and considered in my medical decision making (see chart for details).  Clinical Course   50 year old female with history of endometriosis presents with concern of chest pain. Differential diagnosis for chest pain includes pulmonary embolus, dissection, pneumothorax, pneumonia, ACS, myocarditis, pericarditis, reflux, muscular.  EKG was done and evaluate by me and showed no acute ST changes and no signs of pericarditis. Chest x-ray was done and evaluated by me and radiology and showed no sign of pneumonia or pneumothorax. Patient is PERC negative and low risk [place] and have low suspicion for PE.  Given pain radiating to back at one point, CT dissection protocol ordered and shows no sign of dissection or large PE.  Pt describes tingling of left arm in association with other symptoms. GIven some headache, screening head done and negative. Pt with otherwise normal neurologic exam, and in setting of other cp, no stroke risk factors, have low suspicion for CVA.  Pt without trauma to suggest acute cervical fx as  pathology.Good pulses bilaterally and doubt arterial occlusion, no asymetric swelling to suggest upper ext DVT. Patient is low risk HEART score and had delta troponins which were both negative. Given this evaluation, history and physical have low suspicion for pulmonary embolus, pneumonia, ACS, myocarditis, pericarditis, dissection, CVA, intracranial bleed.  Symptoms improved in ED.  Discussed evaluation and recommend close PCP follow up, return for new or worsening symptoms.  Patient discharged in stable condition with understanding of reasons to return and recommend PCP follow up.   Final Clinical Impressions(s) / ED Diagnoses   Final diagnoses:  Chest pain, unspecified chest pain type  I personally performed the services described in this documentation, which was scribed in my presence. The recorded information has been reviewed and is accurate.    New Prescriptions Discharge Medication List as of 09/27/2015  6:11 AM       Gareth Morgan, MD 09/27/15 1820

## 2015-10-04 ENCOUNTER — Encounter: Payer: Self-pay | Admitting: Gynecology

## 2015-10-04 ENCOUNTER — Ambulatory Visit (INDEPENDENT_AMBULATORY_CARE_PROVIDER_SITE_OTHER): Payer: 59 | Admitting: Gynecology

## 2015-10-04 VITALS — BP 118/76 | Ht 61.0 in | Wt 192.0 lb

## 2015-10-04 DIAGNOSIS — Z13220 Encounter for screening for lipoid disorders: Secondary | ICD-10-CM | POA: Diagnosis not present

## 2015-10-04 DIAGNOSIS — Z01419 Encounter for gynecological examination (general) (routine) without abnormal findings: Secondary | ICD-10-CM | POA: Diagnosis not present

## 2015-10-04 LAB — CBC WITH DIFFERENTIAL/PLATELET
Basophils Absolute: 0 cells/uL (ref 0–200)
Basophils Relative: 0 %
Eosinophils Absolute: 105 cells/uL (ref 15–500)
Eosinophils Relative: 1 %
HCT: 39.2 % (ref 35.0–45.0)
Hemoglobin: 12.8 g/dL (ref 11.7–15.5)
Lymphocytes Relative: 35 %
Lymphs Abs: 3675 cells/uL (ref 850–3900)
MCH: 29.1 pg (ref 27.0–33.0)
MCHC: 32.7 g/dL (ref 32.0–36.0)
MCV: 89.1 fL (ref 80.0–100.0)
MPV: 9.3 fL (ref 7.5–12.5)
Monocytes Absolute: 525 cells/uL (ref 200–950)
Monocytes Relative: 5 %
Neutro Abs: 6195 cells/uL (ref 1500–7800)
Neutrophils Relative %: 59 %
Platelets: 344 10*3/uL (ref 140–400)
RBC: 4.4 MIL/uL (ref 3.80–5.10)
RDW: 13.6 % (ref 11.0–15.0)
WBC: 10.5 10*3/uL (ref 3.8–10.8)

## 2015-10-04 LAB — LIPID PANEL
Cholesterol: 189 mg/dL (ref 125–200)
HDL: 62 mg/dL (ref >=46)
LDL Cholesterol: 110 mg/dL (ref <130)
Total CHOL/HDL Ratio: 3 Ratio (ref <=5.0)
Triglycerides: 87 mg/dL (ref <150)
VLDL: 17 mg/dL (ref <30)

## 2015-10-04 NOTE — Progress Notes (Signed)
    CHEVON FOMBY 05/17/1965 [id number]        50 y.o.  G2P2002  for annual exam.  Several issues noted below.  Past medical history,surgical history, problem list, medications, allergies, family history and social history were all reviewed and documented as reviewed in the EPIC chart.  ROS:  Performed with pertinent positives and negatives included in the history, assessment and plan.   Additional significant findings :  None   Exam: [name] assistant Vitals:   10/04/15 0957  BP: 118/76  Weight: 192 lb (87.1 kg)  Height: '5\' 1"'$  (1.549 m)   Body mass index is 36.28 kg/m.  General appearance:  Normal affect, orientation and appearance. Skin: Grossly normal HEENT: Without gross lesions.  No cervical or supraclavicular adenopathy. Thyroid normal.  Lungs:  Clear without wheezing, rales or rhonchi Cardiac: RR, without RMG Abdominal:  Soft, nontender, without masses, guarding, rebound, organomegaly or hernia Breasts:  Examined lying and sitting without masses, retractions, discharge or axillary adenopathy. Pelvic:  Ext/BUS/Vagina normal. One small cherry angioma mid left labia majora. 2 small cherry angiomas right mid labia majora  Cervix normal  Uterus anteverted, normal size, shape and contour, midline and mobile nontender   Adnexa without masses or tenderness    Anus and perineum normal   Rectovaginal normal sphincter tone without palpated masses or tenderness.    Assessment/Plan:  50 y.o. G29P2002 female for annual exam with regular menses, vasectomy birth control.   1. Several small cherry angiomas on the labia majora. Patient had noticed on self exam and asked me about these. I reassured her they are benign but did ask her to monitor them and if they change at all to represent for reexamination. 2. Recent hospitalization for chest pain. Had questions about mildly elevated white count at 14,000. Also mild atrial enlargement on CT scan. Her chest pain is results and she is  having no symptoms. We'll recheck her white count today. In the absence of symptoms and cardiac exam otherwise normal in CT will monitor. Knows to call if she has any symptoms and will refer to cardiology. 3. Pap smear/HPV 2015 negative. No Pap smear done today. No history of significant abnormal Pap smears. Plan repeat Pap smear approaching 5 year interval per current screening guidelines. 4. Strong family history of breast cancer. Had genetic counseling and was found positive for ATM of unknown significance.  No specific recommendations from genetic counselor as far as increased surveillance. She will continue to follow up with them in the event this gets reclassified as a deleterious variant. 5. Mammography with subsequent benign biopsy 01/2015.  Recommended interval follow up 1 year and she knows to schedule this in December. SBE monthly reviewed. 6. Health maintenance. Check baseline CBC lipid profile and urine analysis. Had CMP at recent hospitalization. Follow up in one year, sooner as needed.   [name] MD, 10:22 AM 10/04/2015

## 2015-10-04 NOTE — Patient Instructions (Signed)

## 2015-10-05 LAB — URINALYSIS W MICROSCOPIC + REFLEX CULTURE
Bacteria, UA: NONE SEEN [HPF]
Bilirubin Urine: NEGATIVE
Casts: NONE SEEN [LPF]
Crystals: NONE SEEN [HPF]
Glucose, UA: NEGATIVE
Hgb urine dipstick: NEGATIVE
Ketones, ur: NEGATIVE
Nitrite: NEGATIVE
Protein, ur: NEGATIVE
Specific Gravity, Urine: 1.009 (ref 1.001–1.035)
Yeast: NONE SEEN [HPF]
pH: 7 (ref 5.0–8.0)

## 2015-10-06 LAB — URINE CULTURE

## 2015-10-11 ENCOUNTER — Encounter: Payer: Self-pay | Admitting: Gynecology

## 2015-10-16 ENCOUNTER — Ambulatory Visit (INDEPENDENT_AMBULATORY_CARE_PROVIDER_SITE_OTHER): Payer: 59 | Admitting: Family Medicine

## 2015-10-16 ENCOUNTER — Encounter: Payer: Self-pay | Admitting: Family Medicine

## 2015-10-16 VITALS — BP 115/81 | HR 75 | Ht 60.25 in | Wt 191.5 lb

## 2015-10-16 DIAGNOSIS — R079 Chest pain, unspecified: Secondary | ICD-10-CM

## 2015-10-16 DIAGNOSIS — Z803 Family history of malignant neoplasm of breast: Secondary | ICD-10-CM

## 2015-10-16 DIAGNOSIS — Z6837 Body mass index (BMI) 37.0-37.9, adult: Secondary | ICD-10-CM

## 2015-10-16 DIAGNOSIS — Z8249 Family history of ischemic heart disease and other diseases of the circulatory system: Secondary | ICD-10-CM | POA: Insufficient documentation

## 2015-10-16 DIAGNOSIS — F411 Generalized anxiety disorder: Secondary | ICD-10-CM | POA: Diagnosis not present

## 2015-10-16 NOTE — Assessment & Plan Note (Addendum)
Had Lap sx - ablation of it on L ovary  Dr. Phineas Real

## 2015-10-16 NOTE — Assessment & Plan Note (Signed)
Anxious and worry about son in [employer] on deployments etc.  Takes Xanax prn.

## 2015-10-16 NOTE — Assessment & Plan Note (Signed)
Most of her life- struggled.

## 2015-10-16 NOTE — Progress Notes (Signed)
New patient office visit note:  Impression and Recommendations:    1. Nonspecific chest pain   2. Family history of breast cancer   3. BMI 37.0-37.9, adult   4. GAD (generalized anxiety disorder)   5. Family history of early CAD      See AVS for additional instructions given to patient. Pt was in the office today for 40+ minutes, with over 50% time spent in face to face counseling of various medical concerns and in coordination of care  - Discussed with patient that she is low risk and I reviewed all of her ED notes and labs as well as imaging studies. Explained to patient we do not use CT chest to evaluate the size of the chambers of the heart or the heart function. She was worried that it says left atrial enlargement. We will send her for an exercise stress echocardiogram.  She is low risk and appears to be anxiety mediated. - Patient will follow-up with her GYN for yearly female exams, mammograms etc. - BMI counseling and dietary and lifestyle modifications discussed. - Patient denies she is anxious today but certainly has contributed to her recent visit to the emergency room. We will revisit and possibly put her on an SSRI in future.    Orders Placed This Encounter  Procedures  . ECHOCARDIOGRAM STRESS TEST    Patient's Medications  New Prescriptions   No medications on file  Previous Medications   ALPRAZOLAM (XANAX) 0.5 MG TABLET    Take 1 tablet (0.5 mg total) by mouth at bedtime as needed.  Modified Medications   No medications on file  Discontinued Medications   No medications on file    Return in about 4 weeks (around 11/13/2015) for f/up of Ex Stress Echo.  The patient was counseled, risk factors were discussed, anticipatory guidance given.  Gross side effects, risk and benefits, and alternatives of medications discussed with patient.  Patient is aware that all medications have potential side effects and we are unable to predict every side effect or  drug-drug interaction that may occur.  Expresses verbal understanding and consents to current therapy plan and treatment regimen.  Please see AVS handed out to patient at the end of our visit for further patient instructions/ counseling done pertaining to today's office visit.    Note: This document was prepared using Dragon voice recognition software and may include unintentional dictation errors.  ----------------------------------------------------------------------------------------------------------------------    Subjective:    Chief Complaint  Patient presents with  . Establish Care    HPI: Angela Ramsey is a pleasant 50 y.o. female who presents to [place] at [facility] today to review their medical history with me and establish care.   Hasn't been to her PCP 5 yrs- goes to GYN yrly.   Married 65yr. 2 kids-- [id number] grand babies.  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    Pt here today b/c recetnly seen at ED on 09/27/15 for Chest pains.  Still having them once in great while - not like before but still concerning esp with her fam hx.  She is most concerned about her CAT scan saying she has left atrial enlargement she reports that she looked this up online and it said she will have a stroke.    Read thru ED notes... From 09/27/15 50year old female with history of endometriosis presents with concern of chest pain. Differential diagnosis for chest pain includes pulmonary embolus,  dissection, pneumothorax, pneumonia, ACS, myocarditis, pericarditis, reflux, muscular.  EKG was done and evaluate by me and showed no acute ST changes and no signs of pericarditis. Chest x-ray was done and evaluated by me and radiology and showed no sign of pneumonia or pneumothorax. Patient is PERC negative and low risk [place] and have low suspicion for PE.  Given pain radiating to back at one point, CT dissection protocol ordered and shows no sign of dissection or large PE.   Pt describes tingling of left arm in association with other symptoms. GIven some headache, screening head done and negative. Pt with otherwise normal neurologic exam, and in setting of other cp, no stroke risk factors, have low suspicion for CVA.  Pt without trauma to suggest acute cervical fx as pathology.Good pulses bilaterally and doubt arterial occlusion, no asymetric swelling to suggest upper ext DVT. Patient is low risk HEART score and had delta troponins which were both negative. Given this evaluation, history and physical have low suspicion for pulmonary embolus, pneumonia, ACS, myocarditis, pericarditis, dissection, CVA, intracranial bleed.  Symptoms improved in ED.  Discussed evaluation and recommend close PCP follow up, return for new or worsening symptoms.  Patient discharged in stable condition with understanding of reasons to return and recommend PCP follow up.    Now L arm not tingling or numb,  Less freq CPains now. COuld be just sitting there- lasts 10 sec or so and then CP gone.  She drove home the day prior to those sx from [place]. Maybe thinks it could be the drive.        Wt Readings from Last 3 Encounters:  10/16/15 191 lb 8 oz (86.9 kg)  10/04/15 192 lb (87.1 kg)  09/26/15 190 lb (86.2 kg)   BP Readings from Last 3 Encounters:  10/16/15 115/81  10/04/15 118/76  09/27/15 118/66   Pulse Readings from Last 3 Encounters:  10/16/15 75  09/27/15 67     Patient Active Problem List   Diagnosis Date Noted  . BMI 37.0-37.9, adult 10/16/2015    Priority: High  . GAD (generalized anxiety disorder) 10/16/2015    Priority: High  . Nonspecific chest pain 10/16/2015    Priority: High  . Family history of early CAD 10/16/2015  . Genetic testing 09/21/2014  . Family history of breast cancer   . Family history of thyroid cancer   . h/o ovarian Endometriosis      Past Medical History:  Diagnosis Date  . Endometriosis   . 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 ATM mutation is a variant of unknown significance. No specific recommendations for follow up based on this made.  . 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 BREAST SURGERY     Breast Bx benign  . ENDOMETRIAL ABLATION    . FOOT SURGERY    . NOVASURE ABLATION  10/2004  . PELVIC LAPAROSCOPY  12/2002   endometriosis     Family History  Problem Relation Age of Onset  . Hypertension Father   . Hyperlipidemia Father   . Stroke Father   . Breast cancer Maternal Aunt 67    mother's identical twin  . Cancer Maternal Aunt     breast  . Cancer Maternal Grandfather     lung  . Diabetes Paternal Grandmother   .  Breast cancer Maternal Grandmother     dx in her late 7s to early 62s  . Cancer Maternal Grandmother     breast  . Breast cancer Maternal Aunt 49    second cancer at  71  . Cancer Maternal Aunt     breast x 2  . Leukemia Paternal Grandfather   . Cancer Paternal Grandfather     leukemia  . Hypertension Mother   . Thyroid cancer Sister     dx in her early to mid 3s  . Cancer Sister     thyroid  . Breast cancer Paternal Aunt 74     History  Drug Use No    History  Alcohol Use  . 0.6 oz/week  . 1 Glasses of wine per week    History  Smoking Status  . Never Smoker  Smokeless Tobacco  . Never Used    Patient's Medications  New Prescriptions   No medications on file  Previous Medications   ALPRAZOLAM (XANAX) 0.5 MG TABLET    Take 1 tablet (0.5 mg total) by mouth at bedtime as needed.  Modified Medications   No medications on file  Discontinued Medications   No medications on file    Allergies: Review of patient's allergies indicates no known allergies.  Review of Systems:   ( Completed via Adult Medical History Intake form today ) General:  Denies fever,  chills, appetite changes, unexplained weight loss.  Optho/Auditory:   Denies visual changes, blurred vision/LOV, ringing in ears/ diff hearing Respiratory:   Denies SOB, DOE, cough, wheezing.  Cardiovascular:   Denies chest pain, palpitations, new onset peripheral edema  Gastrointestinal:   Denies nausea, vomiting, diarrhea.  Genitourinary:    Denies dysuria, increased frequency, flank pain.  Endocrine:     Denies hot or cold intolerance, polyuria, polydipsia. Musculoskeletal:  Denies unexplained myalgias, joint swelling, arthralgias, gait problems.  Skin:  Denies rash, suspicious lesions or new/ changes in moles Neurological:    Denies dizziness, syncope, unexplained weakness, lightheadedness, numbness  Psychiatric/Behavioral:   Denies mood changes, suicidal or homicidal ideations, hallucinations    Objective:   Blood pressure 115/81, pulse 75, height 5' 0.25" (1.53 m), weight 191 lb 8 oz (86.9 kg), last menstrual period 09/23/2015. Body mass index is 37.09 kg/m.   General: Well Developed, well nourished, and in no acute distress.  Neuro: Alert and oriented x3, extra-ocular muscles intact, sensation grossly intact.  HEENT: Normocephalic, atraumatic, pupils equal round reactive to light, neck supple, no gross masses, no carotid bruits, no JVD apprec Skin: no gross suspicious lesions or rashes  Cardiac: Regular rate and rhythm, no murmurs rubs or gallops.  Respiratory: Essentially clear to auscultation bilaterally. Not using accessory muscles, speaking in full sentences.  Abdominal: Soft, not grossly distended Musculoskeletal: Ambulates w/o diff, FROM * 4 ext.  Vasc: less 2 sec cap RF, warm and pink  Psych:  No HI/SI, judgement and insight good, Euthymic mood. Full Affect.

## 2015-10-16 NOTE — Assessment & Plan Note (Signed)
>>  ASSESSMENT AND PLAN FOR BMI 37.0-37.9, ADULT WRITTEN ON 10/16/2015 10:32 AM BY OPALSKI, DEBORAH, DO  Most of her life- struggled.

## 2015-10-16 NOTE — Assessment & Plan Note (Signed)
Papillary she thinks in her sister.

## 2015-10-16 NOTE — Assessment & Plan Note (Signed)
M aunt * 2 and MGM- breast CA.   Breast BX 12/16--> benign.  Negative genetic test.

## 2015-10-16 NOTE — Patient Instructions (Signed)
-  AHA guidelines for exercise of 150 minutes of moderate intensity aerobic activity per week discussed and encouraged.  ( 48min 5 days / week )  Discussed how regular exercise will improve brain function and memory, as well as improve mood, boost immune system and help with weight management, among the other, more well-known effects of exercise such as decreasing risk for hypertension, diabetes, hyperlipidemia etc.   -  The AHA strongly endorses consumption of a diet that contains a variety of foods from all the food categories with an emphasis on fruits and vegetables; fat-free and low-fat dairy products; cereal and grain products; legumes and nuts; and fish, poultry, and or lean meats.   Excessive food intake, especially of foods high in saturated and trans fats, sugar, and salt, should be avoided   Heart-Healthy Eating Plan Many factors influence your heart health, including eating and exercise habits. Heart (coronary) risk increases with abnormal blood fat (lipid) levels. Heart-healthy meal planning includes limiting unhealthy fats, increasing healthy fats, and making other small dietary changes. This includes maintaining a healthy body weight to help keep lipid levels within a normal range. WHAT TYPES OF FAT SHOULD I CHOOSE?  Choose healthy fats more often. Choose monounsaturated and polyunsaturated fats, such as olive oil and canola oil, flaxseeds, walnuts, almonds, and seeds.  Eat more omega-3 fats. Good choices include salmon, mackerel, sardines, tuna, flaxseed oil, and ground flaxseeds. Aim to eat fish at least two times each week.  Limit saturated fats. Saturated fats are primarily found in animal products, such as meats, butter, and cream. Plant sources of saturated fats include palm oil, palm kernel oil, and coconut oil.  Avoid foods with partially hydrogenated oils in them. These contain trans fats. Examples of foods that contain trans fats are stick margarine, some tub margarines,  cookies, crackers, and other baked goods. WHAT GENERAL GUIDELINES DO I NEED TO FOLLOW?  Check food labels carefully to identify foods with trans fats or high amounts of saturated fat.  Fill one half of your plate with vegetables and green salads. Eat 4-5 servings of vegetables per day. A serving of vegetables equals 1 cup of raw leafy vegetables,  cup of raw or cooked cut-up vegetables, or  cup of vegetable juice.  Fill one fourth of your plate with whole grains. Look for the word "whole" as the first word in the ingredient list.  Fill one fourth of your plate with lean protein foods.  Eat 4-5 servings of fruit per day. A serving of fruit equals one medium whole fruit,  cup of dried fruit,  cup of fresh, frozen, or canned fruit, or  cup of 100% fruit juice.  Eat more foods that contain soluble fiber. Examples of foods that contain this type of fiber are apples, broccoli, carrots, beans, peas, and barley. Aim to get 20-30 g of fiber per day.  Eat more home-cooked food and less restaurant, buffet, and fast food.  Limit or avoid alcohol.  Limit foods that are high in starch and sugar.  Avoid fried foods.  Cook foods by using methods other than frying. Baking, boiling, grilling, and broiling are all great options. Other fat-reducing suggestions include:  Removing the skin from poultry.  Removing all visible fats from meats.  Skimming the fat off of stews, soups, and gravies before serving them.  Steaming vegetables in water or broth.  Lose weight if you are overweight. Losing just 5-10% of your initial body weight can help your overall health and prevent diseases  such as diabetes and heart disease.  Increase your consumption of nuts, legumes, and seeds to 4-5 servings per week. One serving of dried beans or legumes equals  cup after being cooked, one serving of nuts equals 1 ounces, and one serving of seeds equals  ounce or 1 tablespoon.  You may need to monitor your salt  (sodium) intake, especially if you have high blood pressure. Talk with your health care provider or dietitian to get more information about reducing sodium. WHAT FOODS CAN I EAT? Grains Breads, including [place], white, pita, wheat, raisin, rye, oatmeal, and [place]. Tortillas that are neither fried nor made with lard or trans fat. Low-fat rolls, including hotdog and hamburger buns and English muffins. Biscuits. Muffins. Waffles. Pancakes. Light popcorn. Whole-grain cereals. Flatbread. Melba toast. Pretzels. Breadsticks. Rusks. Low-fat snacks and crackers, including oyster, saltine, matzo, graham, animal, and rye. Rice and pasta, including brown rice and those that are made with whole wheat. Vegetables All vegetables. Fruits All fruits, but limit coconut. Meats and Other Protein Sources Lean, well-trimmed beef, veal, pork, and lamb. Chicken and [place] without skin. All fish and shellfish. Wild duck, rabbit, pheasant, and venison. Egg whites or low-cholesterol egg substitutes. Dried beans, peas, lentils, and tofu.Seeds and most nuts. Dairy Low-fat or nonfat cheeses, including ricotta, string, and mozzarella. Skim or 1% milk that is liquid, powdered, or evaporated. Buttermilk that is made with low-fat milk. Nonfat or low-fat yogurt. Beverages Mineral water. Diet carbonated beverages. Sweets and Desserts Sherbets and fruit ices. Honey, jam, marmalade, jelly, and syrups. Meringues and gelatins. Pure sugar candy, such as hard candy, jelly beans, gumdrops, mints, marshmallows, and small amounts of dark chocolate. [employer] Eat all sweets and desserts in moderation. Fats and Oils Nonhydrogenated (trans-free) margarines. Vegetable oils, including soybean, sesame, sunflower, olive, peanut, safflower, corn, canola, and cottonseed. Salad dressings or mayonnaise that are made with a vegetable oil. Limit added fats and oils that you use for cooking, baking, salads, and as spreads. Other Cocoa powder.  Coffee and tea. All seasonings and condiments. The items listed above may not be a complete list of recommended foods or beverages. Contact your dietitian for more options. WHAT FOODS ARE NOT RECOMMENDED? Grains Breads that are made with saturated or trans fats, oils, or whole milk. Croissants. Butter rolls. Cheese breads. Sweet rolls. Donuts. Buttered popcorn. Chow mein noodles. High-fat crackers, such as cheese or butter crackers. Meats and Other Protein Sources Fatty meats, such as hotdogs, short ribs, sausage, spareribs, bacon, ribeye roast or steak, and mutton. High-fat deli meats, such as salami and bologna. Caviar. Domestic duck and goose. Organ meats, such as kidney, liver, sweetbreads, brains, gizzard, chitterlings, and heart. Dairy Cream, sour cream, cream cheese, and creamed cottage cheese. Whole milk cheeses, including blue (bleu), Monterey Jack, [place], [place], American, [place], Swiss, [place], [place], and [place]. Whole or 2% milk that is liquid, evaporated, or condensed. Whole buttermilk. Cream sauce or high-fat cheese sauce. Yogurt that is made from whole milk. Beverages Regular sodas and drinks with added sugar. Sweets and Desserts Frosting. Pudding. Cookies. Cakes other than angel food cake. Candy that has milk chocolate or white chocolate, hydrogenated fat, butter, coconut, or unknown ingredients. Buttered syrups. Full-fat ice cream or ice cream drinks. Fats and Oils Gravy that has suet, meat fat, or shortening. Cocoa butter, hydrogenated oils, palm oil, coconut oil, palm kernel oil. These can often be found in baked products, candy, fried foods, nondairy creamers, and whipped toppings. Solid fats and shortenings, including bacon fat, salt  pork, lard, and butter. Nondairy cream substitutes, such as coffee creamers and sour cream substitutes. Salad dressings that are made of unknown oils, cheese, or sour cream. The items listed above may not be a complete list of foods and  beverages to avoid. Contact your dietitian for more information.   This information is not intended to replace advice given to you by your health care provider. Make sure you discuss any questions you have with your health care provider.   Document Released: 11/12/2007 Document Revised: 02/23/2014 Document Reviewed: 07/27/2013 Elsevier Interactive Patient Education 2016 [place] Ten Foods for Health  1. Water Drink at least 8 to 12 cups of water daily. Consume half of your body weight in pounds, is the amount of water in ounces to drink daily.  Ie: a 200lb person = 100 oz water daily  2. Dark Green Vegetables Eat dark green vegetables at least three to four times a week. Good options include broccoli, peppers, brussel sprouts and leafy greens like kale and spinach.  3. Whole Grains Whole grains should be included in your diet at least two to three times daily. Look for whole wheat flour, rye, oatmeal, barley, amaranth, quinoa or a multigrain. A good source of fiber includes 3 to 4 grams of fiber per serving. A great source has 5 or more grams of fiber per serving.  4. Beans and Lentils Try to eat a bean-based meal at least once a week. Try to add legumes, including beans and lentils, to soups, stews, casseroles, salads and dips or eat them plain.  5. Fish Try to eat two to three serving of fish a week. A serving consists of 3 to 4 ounces of cooked fish. Good choices are salmon, trout, herring, bluefish, sardines and tuna.  6. Berries Include two to four servings of fruit in your diet each day. Try to eat berries such as raspberries, blueberries, blackberries and strawberries.  7. Winter Squash Eat butternut and acorn squash as well as other richly pigmented dark orange and green colored vegetables like sweet potato, cantaloupe and mango.  8. Soy 25 grams of soy protein a day is recommended as part of a low-fat diet to help lower cholesterol levels. Try tofu, soymilk,  edamame soybeans, tempeh and texturized vegetable protein (TVP).  9. Flaxseed, Nuts and Seeds Add 1 to 2 tablespoons of ground flaxseed or other seeds to food each day or include a moderate amount of nuts - 1/4 cup - in your daily diet.  10. Organic Yogurt Men and women between 40 and 39 years of age need 1000 milligrams of calcium a day and 1200 milligrams if 14 or older. Eat calcium-rich foods such as nonfat or low-fat dairy products three to four times a day. Include organic choices.

## 2015-10-16 NOTE — Assessment & Plan Note (Signed)
Sister had AMI age 48!

## 2015-10-25 ENCOUNTER — Other Ambulatory Visit: Payer: Self-pay

## 2015-10-25 DIAGNOSIS — R0789 Other chest pain: Secondary | ICD-10-CM

## 2015-10-25 NOTE — Progress Notes (Signed)
Pt called stating that she has not heard anything about an appointment

## 2015-10-30 ENCOUNTER — Encounter: Payer: Self-pay | Admitting: Gynecology

## 2015-10-30 ENCOUNTER — Ambulatory Visit (INDEPENDENT_AMBULATORY_CARE_PROVIDER_SITE_OTHER): Payer: 59 | Admitting: Gynecology

## 2015-10-30 VITALS — BP 122/78

## 2015-10-30 DIAGNOSIS — N898 Other specified noninflammatory disorders of vagina: Secondary | ICD-10-CM

## 2015-10-30 DIAGNOSIS — R300 Dysuria: Secondary | ICD-10-CM | POA: Diagnosis not present

## 2015-10-30 DIAGNOSIS — L298 Other pruritus: Secondary | ICD-10-CM

## 2015-10-30 LAB — URINALYSIS W MICROSCOPIC + REFLEX CULTURE
BILIRUBIN URINE: NEGATIVE
Casts: NONE SEEN [LPF]
Crystals: NONE SEEN [HPF]
Glucose, UA: NEGATIVE
KETONES UR: NEGATIVE
NITRITE: NEGATIVE
PH: 7 (ref 5.0–8.0)
Protein, ur: NEGATIVE
SPECIFIC GRAVITY, URINE: 1.01 (ref 1.001–1.035)
YEAST: NONE SEEN [HPF]

## 2015-10-30 LAB — WET PREP FOR TRICH, YEAST, CLUE
CLUE CELLS WET PREP: NONE SEEN
Trich, Wet Prep: NONE SEEN

## 2015-10-30 MED ORDER — SULFAMETHOXAZOLE-TRIMETHOPRIM 800-160 MG PO TABS: 1.0000 | ORAL_TABLET | Freq: Two times a day (BID) | ORAL | 0 refills | Status: DC

## 2015-10-30 MED ORDER — FLUCONAZOLE 150 MG PO TABS: 150.0000 mg | ORAL_TABLET | Freq: Once | ORAL | 0 refills | Status: AC

## 2015-10-30 NOTE — Progress Notes (Signed)
    Angela Ramsey 1965/03/12 [id number]        50 y.o.  G2P2002 presents with several days of dysuria and suprapubic discomfort. No fever or chills nausea vomiting diarrhea constipation. Mild frequency no urgency. Also vaginal itching. No discharge or odor.  Past medical history,surgical history, problem list, medications, allergies, family history and social history were all reviewed and documented in the EPIC chart.  Directed ROS with pertinent positives and negatives documented in the history of present illness/assessment and plan.  Exam: [name] assistant Vitals:   10/30/15 1107  BP: 122/78   General appearance:  Normal Spine straight without CVA tenderness Abdomen soft with mild suprapubic tenderness. No masses guarding rebound Pelvic external BUS vagina with slight white discharge. Cervix normal. Uterus normal size midline mobile nontender. Adnexa without masses or tenderness  Assessment/Plan:  50 y.o. [id number] with above history and exam. Urinalysis consistent with UTI showing 6-10 WBC with few bacteria. Wet prep is positive for yeast. Will treat with Septra DS 1 by mouth twice a day 3 days and Diflucan 150 mg 1 dose. Follow up if symptoms persist, worsen or recur.    [name] MD, 11:17 AM 10/30/2015

## 2015-10-30 NOTE — Patient Instructions (Signed)
Take the antibiotic pill twice daily for 3 days Take the Diflucan pill once Follow up if symptoms persist, worsen or recur.

## 2015-11-01 LAB — URINE CULTURE

## 2015-11-19 ENCOUNTER — Other Ambulatory Visit (HOSPITAL_COMMUNITY): Payer: 59

## 2015-11-20 ENCOUNTER — Encounter: Payer: Self-pay | Admitting: Family Medicine

## 2015-11-20 ENCOUNTER — Ambulatory Visit (INDEPENDENT_AMBULATORY_CARE_PROVIDER_SITE_OTHER): Payer: 59 | Admitting: Family Medicine

## 2015-11-20 VITALS — BP 125/89 | HR 71 | Ht 60.0 in | Wt 193.0 lb

## 2015-11-20 DIAGNOSIS — Z8249 Family history of ischemic heart disease and other diseases of the circulatory system: Secondary | ICD-10-CM | POA: Diagnosis not present

## 2015-11-20 DIAGNOSIS — J3089 Other allergic rhinitis: Secondary | ICD-10-CM | POA: Diagnosis not present

## 2015-11-20 DIAGNOSIS — F411 Generalized anxiety disorder: Secondary | ICD-10-CM | POA: Diagnosis not present

## 2015-11-20 NOTE — Patient Instructions (Signed)
-  Discussed with patient seasonal allergies and typical preventative strategies- i.e. N95 mask etc  -Recommended nasal rinses\ washes twice a day as well as after any prolonged exposure to outdoor environmental factors.  -If continues to worsen despite preventative strategies, take over-the-counter Allegra D daily during allergy seasons.  We can consider Flonase, Rhinocort and the like if symptoms not well controlled with just oral tablet, nasal rinses and preventative strategies

## 2015-11-20 NOTE — Progress Notes (Signed)
Subjective:    HPI: Angela Ramsey is a 50 y.o. female who presents to [place] at [facility] today for Stress f/up- which was presumed to be cause of her non-specific CP.  She still has not had her TTE stress test yet.   Sister with AMI at age 75 and had no pre-symptoms etc and pt very worried that she will have the same thing.  Some anxiety and excessive worry about things. Only used one tab Xanax last 3 months.   Again declines daily meds.   GAD 7 : Generalized Anxiety Score 11/20/2015  Nervous, Anxious, on Edge 0  Control/stop worrying 1  Worry too much - different things 0  Trouble relaxing 0  Restless 0  Easily annoyed or irritable 0  Afraid - awful might happen 0  Total GAD 7 Score 1  Anxiety Difficulty Not difficult at all    Current Outpatient Prescriptions on File Prior to Visit  Medication Sig Dispense Refill  . ALPRAZolam (XANAX) 0.5 MG tablet Take 1 tablet (0.5 mg total) by mouth at bedtime as needed. 30 tablet 2   No current facility-administered medications on file prior to visit.      Wt Readings from Last 3 Encounters:  11/20/15 193 lb (87.5 kg)  10/16/15 191 lb 8 oz (86.9 kg)  10/04/15 192 lb (87.1 kg)   BP Readings from Last 3 Encounters:  11/20/15 125/89  10/30/15 122/78  10/16/15 115/81   Pulse Readings from Last 3 Encounters:  11/20/15 71  10/16/15 75  09/27/15 67   BMI Readings from Last 3 Encounters:  11/20/15 37.69 kg/m  10/16/15 37.09 kg/m  10/04/15 36.28 kg/m     Patient Active Problem List   Diagnosis Date Noted  . BMI 37.0-37.9, adult 10/16/2015    Priority: High  . GAD (generalized anxiety disorder) 10/16/2015    Priority: High  . Nonspecific chest pain 10/16/2015    Priority: High  . Environmental and seasonal allergies 12/01/2015  . Family history of early CAD 10/16/2015  . Genetic testing 09/21/2014  . Family history of breast cancer   . Family history of thyroid cancer   . h/o ovarian  Endometriosis     Past Surgical History:  Procedure Laterality Date  . BREAST SURGERY     Breast Bx benign  . ENDOMETRIAL ABLATION    . FOOT SURGERY    . NOVASURE ABLATION  10/2004  . PELVIC LAPAROSCOPY  12/2002   endometriosis    family history includes Breast cancer in her maternal grandmother; Breast cancer (age of onset: 28) in her maternal aunt; Breast cancer (age of onset: 65) in her maternal aunt; Breast cancer (age of onset: 70) in her paternal aunt; Cancer in her maternal aunt, maternal aunt, maternal grandfather, maternal grandmother, paternal grandfather, and sister; Diabetes in her paternal grandmother; Hyperlipidemia in her father; Hypertension in her father and mother; Leukemia in her paternal grandfather; Stroke in her father; Thyroid cancer in her sister.  History  Drug Use No  ,  History  Alcohol Use  . 0.6 oz/week  . 1 Glasses of wine per week  ,  History  Smoking Status  . Never Smoker  Smokeless Tobacco  . Never Used  ,  History  Sexual Activity  . Sexual activity: Yes  . Birth control/ protection: Surgical    Comment: 1st intercourse 20 yo-5 partners-Vasectomy    Patient's Medications  New Prescriptions   No medications on file  Previous Medications   ALPRAZOLAM (XANAX) 0.5 MG TABLET    Take 1 tablet (0.5 mg total) by mouth at bedtime as needed.   CETIRIZINE (ZYRTEC) 10 MG TABLET    Take 10 mg by mouth as needed for allergies.  Modified Medications   No medications on file  Discontinued Medications   SULFAMETHOXAZOLE-TRIMETHOPRIM (BACTRIM DS,SEPTRA DS) 800-160 MG TABLET    Take 1 tablet by mouth 2 (two) times daily.    Review of patient's allergies indicates no known allergies.  Current Meds  Medication Sig  . ALPRAZolam (XANAX) 0.5 MG tablet Take 1 tablet (0.5 mg total) by mouth at bedtime as needed.  . cetirizine (ZYRTEC) 10 MG tablet Take 10 mg by mouth as needed for allergies.    Review of Systems  Constitutional: Negative.  Negative  for chills, diaphoresis, fever, malaise/fatigue and weight loss.  HENT: Negative.  Negative for congestion, sore throat and tinnitus.   Eyes: Negative.  Negative for blurred vision, double vision and photophobia.  Respiratory: Negative.  Negative for cough and wheezing.   Cardiovascular: Negative.  Negative for chest pain and palpitations.  Gastrointestinal: Negative.  Negative for blood in stool, diarrhea, nausea and vomiting.  Genitourinary: Negative.  Negative for dysuria, frequency and urgency.  Musculoskeletal: Negative.  Negative for joint pain and myalgias.  Skin: Negative.  Negative for itching and rash.  Neurological: Negative.  Negative for dizziness, focal weakness, weakness and headaches.  Endo/Heme/Allergies: Negative.  Negative for environmental allergies and polydipsia. Does not bruise/bleed easily.  Psychiatric/Behavioral: Negative for depression, memory loss and suicidal ideas. The patient is nervous/anxious. The patient does not have insomnia.      Objective:  Blood pressure 125/89, pulse 71, height 5' (1.524 m), weight 193 lb (87.5 kg), last menstrual period 11/06/2015. Body mass index is 37.69 kg/m.  General: Well Developed, well nourished, and in no acute distress.  HEENT: Normocephalic, atraumatic Skin: Warm and dry, cap RF less 2 sec, good turgor Respiratory: speaking in full sentences, no conversational dyspnea NeuroM-Sk: Ambulates w/o assistance, moves * 4 Psych: A and O *3, No SI/HI    Impression and Recommendations:    1. GAD (generalized anxiety disorder)   2. Environmental and seasonal allergies   3. Family history of early CAD    1. Cont meds; stress mgt d/c pt, see avs 2. Cont meds otc and sinus rinses BID- see AVS 3. Go for Stress Echo near future.  Pt has busy schedule and made first available that would fit her schedule  New Prescriptions   No medications on file    Modified Medications   No medications on file    Discontinued  Medications   SULFAMETHOXAZOLE-TRIMETHOPRIM (BACTRIM DS,SEPTRA DS) 800-160 MG TABLET    Take 1 tablet by mouth 2 (two) times daily.   The patient was counseled, risk factors were discussed, anticipatory guidance given.  Gross side effects, risk and benefits, and alternatives of medications discussed with patient.  Patient is aware that all medications have potential side effects and we are unable to predict every side effect or drug-drug interaction that may occur.  Expresses verbal understanding and consents to current therapy plan and treatment regimen.  Return in about 6 weeks (around 01/01/2016) for For wellness exam & health maintenance evaluation- come fasting.  Please see AVS handed out to patient at the end of our visit for further patient instructions/ counseling done pertaining to today's office visit.    Note: This document was prepared using Systems analyst  and may include unintentional dictation errors.

## 2015-11-29 ENCOUNTER — Telehealth (HOSPITAL_COMMUNITY): Payer: Self-pay | Admitting: *Deleted

## 2015-11-29 NOTE — Telephone Encounter (Signed)
Patient given detailed instructions per Stress Test Requisition Sheet for test on 12/04/15 at 2:30.Patient Notified to arrive 30 minutes early, and that it is imperative to arrive on time for appointment to keep from having the test rescheduled.  Patient verbalized understanding. Angela Ramsey

## 2015-12-01 DIAGNOSIS — J3089 Other allergic rhinitis: Secondary | ICD-10-CM | POA: Insufficient documentation

## 2015-12-04 ENCOUNTER — Ambulatory Visit (HOSPITAL_BASED_OUTPATIENT_CLINIC_OR_DEPARTMENT_OTHER): Payer: 59

## 2015-12-04 ENCOUNTER — Encounter: Payer: Self-pay | Admitting: Family Medicine

## 2015-12-04 ENCOUNTER — Ambulatory Visit (HOSPITAL_COMMUNITY): Payer: 59 | Attending: Cardiology

## 2015-12-04 DIAGNOSIS — R0789 Other chest pain: Secondary | ICD-10-CM | POA: Insufficient documentation

## 2015-12-04 DIAGNOSIS — R0989 Other specified symptoms and signs involving the circulatory and respiratory systems: Secondary | ICD-10-CM

## 2015-12-08 ENCOUNTER — Encounter: Payer: Self-pay | Admitting: Family Medicine

## 2015-12-30 ENCOUNTER — Ambulatory Visit (INDEPENDENT_AMBULATORY_CARE_PROVIDER_SITE_OTHER): Payer: 59 | Admitting: Family Medicine

## 2015-12-30 ENCOUNTER — Encounter: Payer: Self-pay | Admitting: Family Medicine

## 2015-12-30 VITALS — BP 125/85 | HR 69 | Ht 60.0 in | Wt 193.3 lb

## 2015-12-30 DIAGNOSIS — Z0000 Encounter for general adult medical examination without abnormal findings: Secondary | ICD-10-CM

## 2015-12-30 DIAGNOSIS — Z1211 Encounter for screening for malignant neoplasm of colon: Secondary | ICD-10-CM | POA: Diagnosis not present

## 2015-12-30 DIAGNOSIS — Z803 Family history of malignant neoplasm of breast: Secondary | ICD-10-CM

## 2015-12-30 DIAGNOSIS — Z1389 Encounter for screening for other disorder: Secondary | ICD-10-CM

## 2015-12-30 DIAGNOSIS — Z6837 Body mass index (BMI) 37.0-37.9, adult: Secondary | ICD-10-CM

## 2015-12-30 DIAGNOSIS — Z23 Encounter for immunization: Secondary | ICD-10-CM

## 2015-12-30 DIAGNOSIS — D72829 Elevated white blood cell count, unspecified: Secondary | ICD-10-CM

## 2015-12-30 DIAGNOSIS — Z808 Family history of malignant neoplasm of other organs or systems: Secondary | ICD-10-CM

## 2015-12-30 LAB — POC HEMOCCULT BLD/STL (OFFICE/1-CARD/DIAGNOSTIC): FECAL OCCULT BLD: NEGATIVE

## 2015-12-30 NOTE — Patient Instructions (Addendum)
PLEASE NOTE:  The office will be calling if your labs / recent test results are not within acceptable normal values within one week of them being done.    Furthermore, you'll be able to review all of your results in "My Chart," when they become available; so please sign-up if you have not already done so.   Keep a copy of these results in your personal home file to share with your other physicians as warranted. If you have any further questions or concerns please do not hesitate to contact us.   Thank you and we appreciate you choosing [place] as your primary care provider.   - 'the Team' at [facility]  ---------------------------------------------------------------------------------------------------    Preventive Care for Adults, Female  A healthy lifestyle and preventive care can promote health and wellness. Preventive health guidelines for women include the following key practices.   A routine yearly physical is a good way to check with your health care provider about your health and preventive screening. It is a chance to share any concerns and updates on your health and to receive a thorough exam.   Visit your dentist for a routine exam and preventive care every 6 months. Brush your teeth twice a day and floss once a day. Good oral hygiene prevents tooth decay and gum disease.   The frequency of eye exams is based on your age, health, family medical history, use of contact lenses, and other factors. Follow your health care provider's recommendations for frequency of eye exams.   Eat a healthy diet. Foods like vegetables, fruits, whole grains, low-fat dairy products, and lean protein foods contain the nutrients you need without too many calories. Decrease your intake of foods high in solid fats, added sugars, and salt. Eat the right amount of calories for you.Get information about a proper diet from your health care provider, if necessary.   Regular physical exercise is  one of the most important things you can do for your health. Most adults should get at least 150 minutes of moderate-intensity exercise (any activity that increases your heart rate and causes you to sweat) each week. In addition, most adults need muscle-strengthening exercises on 2 or more days a week.   Maintain a healthy weight. The body mass index (BMI) is a screening tool to identify possible weight problems. It provides an estimate of body fat based on height and weight. Your health care provider can find your BMI, and can help you achieve or maintain a healthy weight.For adults 20 years and older:   - A BMI below 18.5 is considered underweight.   - A BMI of 18.5 to 24.9 is normal.   - A BMI of 25 to 29.9 is considered overweight.   - A BMI of 30 and above is considered obese.   Maintain normal blood lipids and cholesterol levels by exercising and minimizing your intake of trans and saturated fats.  Eat a balanced diet with plenty of fruit and vegetables. Blood tests for lipids and cholesterol should begin at age 51 and be repeated every 5 years minimum.  If your lipid or cholesterol levels are high, you are over 40, or you are at high risk for heart disease, you may need your cholesterol levels checked more frequently.Ongoing high lipid and cholesterol levels should be treated with medicines if diet and exercise are not working.   If you smoke, find out from your health care provider how to quit. If you do not use tobacco, do  not start.   Lung cancer screening is recommended for adults aged 17-80 years who are at high risk for developing lung cancer because of a history of smoking. A yearly low-dose CT scan of the lungs is recommended for people who have at least a 30-pack-year history of smoking and are a current smoker or have quit within the past 15 years. A pack year of smoking is smoking an average of 1 pack of cigarettes a day for 1 year (for example: 1 pack a day for 30 years or 2  packs a day for 15 years). Yearly screening should continue until the smoker has stopped smoking for at least 15 years. Yearly screening should be stopped for people who develop a health problem that would prevent them from having lung cancer treatment.   If you are pregnant, do not drink alcohol. If you are breastfeeding, be very cautious about drinking alcohol. If you are not pregnant and choose to drink alcohol, do not have more than 1 drink per day. One drink is considered to be 12 ounces (355 mL) of beer, 5 ounces (148 mL) of wine, or 1.5 ounces (44 mL) of liquor.   Avoid use of street drugs. Do not share needles with anyone. Ask for help if you need support or instructions about stopping the use of drugs.   High blood pressure causes heart disease and increases the risk of stroke. Your blood pressure should be checked at least yearly.  Ongoing high blood pressure should be treated with medicines if weight loss and exercise do not work.   If you are 37-44 years old, ask your health care provider if you should take aspirin to prevent strokes.   Diabetes screening involves taking a blood sample to check your fasting blood sugar level. This should be done once every 3 years, after age 33, if you are within normal weight and without risk factors for diabetes. Testing should be considered at a younger age or be carried out more frequently if you are overweight and have at least 1 risk factor for diabetes.   Breast cancer screening is essential preventive care for women. You should practice "breast self-awareness."  This means understanding the normal appearance and feel of your breasts and may include breast self-examination.  Any changes detected, no matter how small, should be reported to a health care provider.  Women in their 81s and 30s should have a clinical breast exam (CBE) by a health care provider as part of a regular health exam every 1 to 3 years.  After age 35, women should have a CBE  every year.  Starting at age 108, women should consider having a mammogram (breast X-ray test) every year.  Women who have a family history of breast cancer should talk to their health care provider about genetic screening.  Women at a high risk of breast cancer should talk to their health care providers about having an MRI and a mammogram every year.   -Breast cancer gene (BRCA)-related cancer risk assessment is recommended for women who have family members with BRCA-related cancers. BRCA-related cancers include breast, ovarian, tubal, and peritoneal cancers. Having family members with these cancers may be associated with an increased risk for harmful changes (mutations) in the breast cancer genes BRCA1 and BRCA2. Results of the assessment will determine the need for genetic counseling and BRCA1 and BRCA2 testing.   The Pap test is a screening test for cervical cancer. A Pap test can show cell changes on the  cervix that might become cervical cancer if left untreated. A Pap test is a procedure in which cells are obtained and examined from the lower end of the uterus (cervix).   - Women should have a Pap test starting at age 35.   - Between ages 33 and 1, Pap tests should be repeated every 2 years.   - Beginning at age 60, you should have a Pap test every 3 years as long as the past 3 Pap tests have been normal.   - Some women have medical problems that increase the chance of getting cervical cancer. Talk to your health care provider about these problems. It is especially important to talk to your health care provider if a new problem develops soon after your last Pap test. In these cases, your health care provider may recommend more frequent screening and Pap tests.   - The above recommendations are the same for women who have or have not gotten the vaccine for human papillomavirus (HPV).   - If you had a hysterectomy for a problem that was not cancer or a condition that could lead to cancer, then  you no longer need Pap tests. Even if you no longer need a Pap test, a regular exam is a good idea to make sure no other problems are starting.   - If you are between ages 47 and 39 years, and you have had normal Pap tests going back 10 years, you no longer need Pap tests. Even if you no longer need a Pap test, a regular exam is a good idea to make sure no other problems are starting.   - If you have had past treatment for cervical cancer or a condition that could lead to cancer, you need Pap tests and screening for cancer for at least 20 years after your treatment.   - If Pap tests have been discontinued, risk factors (such as a new sexual partner) need to be reassessed to determine if screening should be resumed.   - The HPV test is an additional test that may be used for cervical cancer screening. The HPV test looks for the virus that can cause the cell changes on the cervix. The cells collected during the Pap test can be tested for HPV. The HPV test could be used to screen women aged 24 years and older, and should be used in women of any age who have unclear Pap test results. After the age of 13, women should have HPV testing at the same frequency as a Pap test.   Colorectal cancer can be detected and often prevented. Most routine colorectal cancer screening begins at the age of 80 years and continues through age 41 years. However, your health care provider may recommend screening at an earlier age if you have risk factors for colon cancer. On a yearly basis, your health care provider may provide home test kits to check for hidden blood in the stool.  Use of a small camera at the end of a tube, to directly examine the colon (sigmoidoscopy or colonoscopy), can detect the earliest forms of colorectal cancer. Talk to your health care provider about this at age 23, when routine screening begins. Direct exam of the colon should be repeated every 5 -10 years through age 23 years, unless early forms of  pre-cancerous polyps or small growths are found.   People who are at an increased risk for hepatitis B should be screened for this virus. You are considered at high risk  for hepatitis B if:  -You were born in a country where hepatitis B occurs often. Talk with your health care provider about which countries are considered high risk.  - Your parents were born in a high-risk country and you have not received a shot to protect against hepatitis B (hepatitis B vaccine).  - You have HIV or AIDS.  - You use needles to inject street drugs.  - You live with, or have sex with, someone who has Hepatitis B.  - You get hemodialysis treatment.  - You take certain medicines for conditions like cancer, organ transplantation, and autoimmune conditions.   Hepatitis C blood testing is recommended for all people born from [birth year] through 1965 and any individual with known risks for hepatitis C.   Practice safe sex. Use condoms and avoid high-risk sexual practices to reduce the spread of sexually transmitted infections (STIs). STIs include gonorrhea, chlamydia, syphilis, trichomonas, herpes, HPV, and human immunodeficiency virus (HIV). Herpes, HIV, and HPV are viral illnesses that have no cure. They can result in disability, cancer, and death. Sexually active women aged 28 years and younger should be checked for chlamydia. Older women with new or multiple partners should also be tested for chlamydia. Testing for other STIs is recommended if you are sexually active and at increased risk.   Osteoporosis is a disease in which the bones lose minerals and strength with aging. This can result in serious bone fractures or breaks. The risk of osteoporosis can be identified using a bone density scan. Women ages 31 years and over and women at risk for fractures or osteoporosis should discuss screening with their health care providers. Ask your health care provider whether you should take a calcium supplement or vitamin D to There  are also several preventive steps women can take to avoid osteoporosis and resulting fractures or to keep osteoporosis from worsening. -->Recommendations include:  Eat a balanced diet high in fruits, vegetables, calcium, and vitamins.  Get enough calcium. The recommended total intake of is 1,200 mg daily; for best absorption, if taking supplements, divide doses into 250-500 mg doses throughout the day. Of the two types of calcium, calcium carbonate is best absorbed when taken with food but calcium citrate can be taken on an empty stomach.  Get enough vitamin D. [employer] and the [employer] recommend at least 1,000 IU per day for women age 46 and over who are at risk of vitamin D deficiency. Vitamin D deficiency can be caused by inadequate sun exposure (for example, those who live in [place]).  Avoid alcohol and smoking. Heavy alcohol intake (more than 7 drinks per week) increases the risk of falls and hip fracture and women smokers tend to lose bone more rapidly and have lower bone mass than nonsmokers. Stopping smoking is one of the most important changes women can make to improve their health and decrease risk for disease.  Be physically active every day. Weight-bearing exercise (for example, fast walking, hiking, jogging, and weight training) may strengthen bones or slow the rate of bone loss that comes with aging. Balancing and muscle-strengthening exercises can reduce the risk of falling and fracture.  Consider therapeutic medications. Currently, several types of effective drugs are available. Healthcare providers can recommend the type most appropriate for each woman.  Eliminate environmental factors that may contribute to accidents. Falls cause nearly 90% of all osteoporotic fractures, so reducing this risk is an important bone-health strategy. Measures include ample lighting, removing obstructions to walking, using nonskid rugs  on floors, and placing mats and/or  grab bars in showers.  Be aware of medication side effects. Some common medicines make bones weaker. These include a type of steroid drug called glucocorticoids used for arthritis and asthma, some antiseizure drugs, certain sleeping pills, treatments for endometriosis, and some cancer drugs. An overactive thyroid gland or using too much thyroid hormone for an underactive thyroid can also be a problem. If you are taking these medicines, talk to your doctor about what you can do to help protect your bones.reduce the rate of osteoporosis.    Menopause can be associated with physical symptoms and risks. Hormone replacement therapy is available to decrease symptoms and risks. You should talk to your health care provider about whether hormone replacement therapy is right for you.   Use sunscreen. Apply sunscreen liberally and repeatedly throughout the day. You should seek shade when your shadow is shorter than you. Protect yourself by wearing long sleeves, pants, a wide-brimmed hat, and sunglasses year round, whenever you are outdoors.   Once a month, do a whole body skin exam, using a mirror to look at the skin on your back. Tell your health care provider of new moles, moles that have irregular borders, moles that are larger than a pencil eraser, or moles that have changed in shape or color.   -Stay current with required vaccines (immunizations).   Influenza vaccine. All adults should be immunized every year.  Tetanus, diphtheria, and acellular pertussis (Td, Tdap) vaccine. Pregnant women should receive 1 dose of Tdap vaccine during each pregnancy. The dose should be obtained regardless of the length of time since the last dose. Immunization is preferred during the 27th 36th week of gestation. An adult who has not previously received Tdap or who does not know her vaccine status should receive 1 dose of Tdap. This initial dose should be followed by tetanus and diphtheria toxoids (Td) booster doses  every 10 years. Adults with an unknown or incomplete history of completing a 3-dose immunization series with Td-containing vaccines should begin or complete a primary immunization series including a Tdap dose. Adults should receive a Td booster every 10 years.  Varicella vaccine. An adult without evidence of immunity to varicella should receive 2 doses or a second dose if she has previously received 1 dose. Pregnant females who do not have evidence of immunity should receive the first dose after pregnancy. This first dose should be obtained before leaving the health care facility. The second dose should be obtained 4 8 weeks after the first dose.  Human papillomavirus (HPV) vaccine. Females aged 47 26 years who have not received the vaccine previously should obtain the 3-dose series. The vaccine is not recommended for use in pregnant females. However, pregnancy testing is not needed before receiving a dose. If a female is found to be pregnant after receiving a dose, no treatment is needed. In that case, the remaining doses should be delayed until after the pregnancy. Immunization is recommended for any person with an immunocompromised condition through the age of 19 years if she did not get any or all doses earlier. During the 3-dose series, the second dose should be obtained 4 8 weeks after the first dose. The third dose should be obtained 24 weeks after the first dose and 16 weeks after the second dose.  Zoster vaccine. One dose is recommended for adults aged 80 years or older unless certain conditions are present.  Measles, mumps, and rubella (MMR) vaccine. Adults born before 23 generally are  considered immune to measles and mumps. Adults born in [birth year] or later should have 1 or more doses of MMR vaccine unless there is a contraindication to the vaccine or there is laboratory evidence of immunity to each of the three diseases. A routine second dose of MMR vaccine should be obtained at least 28 days after  the first dose for students attending postsecondary schools, health care workers, or international travelers. People who received inactivated measles vaccine or an unknown type of measles vaccine during 1963 1967 should receive 2 doses of MMR vaccine. People who received inactivated mumps vaccine or an unknown type of mumps vaccine before 1979 and are at high risk for mumps infection should consider immunization with 2 doses of MMR vaccine. For females of childbearing age, rubella immunity should be determined. If there is no evidence of immunity, females who are not pregnant should be vaccinated. If there is no evidence of immunity, females who are pregnant should delay immunization until after pregnancy. Unvaccinated health care workers born before 65 who lack laboratory evidence of measles, mumps, or rubella immunity or laboratory confirmation of disease should consider measles and mumps immunization with 2 doses of MMR vaccine or rubella immunization with 1 dose of MMR vaccine.  Pneumococcal 13-valent conjugate (PCV13) vaccine. When indicated, a person who is uncertain of her immunization history and has no record of immunization should receive the PCV13 vaccine. An adult aged 81 years or older who has certain medical conditions and has not been previously immunized should receive 1 dose of PCV13 vaccine. This PCV13 should be followed with a dose of pneumococcal polysaccharide (PPSV23) vaccine. The PPSV23 vaccine dose should be obtained at least 8 weeks after the dose of PCV13 vaccine. An adult aged 48 years or older who has certain medical conditions and previously received 1 or more doses of PPSV23 vaccine should receive 1 dose of PCV13. The PCV13 vaccine dose should be obtained 1 or more years after the last PPSV23 vaccine dose.  Pneumococcal polysaccharide (PPSV23) vaccine. When PCV13 is also indicated, PCV13 should be obtained first. All adults aged 90 years and older should be immunized. An adult  younger than age 81 years who has certain medical conditions should be immunized. Any person who resides in a nursing home or long-term care facility should be immunized. An adult smoker should be immunized. People with an immunocompromised condition and certain other conditions should receive both PCV13 and PPSV23 vaccines. People with human immunodeficiency virus (HIV) infection should be immunized as soon as possible after diagnosis. Immunization during chemotherapy or radiation therapy should be avoided. Routine use of PPSV23 vaccine is not recommended for American Indians, [place] Natives, or people younger than 65 years unless there are medical conditions that require PPSV23 vaccine. When indicated, people who have unknown immunization and have no record of immunization should receive PPSV23 vaccine. One-time revaccination 5 years after the first dose of PPSV23 is recommended for people aged 94 64 years who have chronic kidney failure, nephrotic syndrome, asplenia, or immunocompromised conditions. People who received 1 2 doses of PPSV23 before age 36 years should receive another dose of PPSV23 vaccine at age 9 years or later if at least 5 years have passed since the previous dose. Doses of PPSV23 are not needed for people immunized with PPSV23 at or after age 11 years.  Meningococcal vaccine. Adults with asplenia or persistent complement component deficiencies should receive 2 doses of quadrivalent meningococcal conjugate (MenACWY-D) vaccine. The doses should be obtained at least 2 months  apart. Microbiologists working with certain meningococcal bacteria, [place] recruits, people at risk during an outbreak, and people who travel to or live in countries with a high rate of meningitis should be immunized. A first-year college student up through age 15 years who is living in a residence hall should receive a dose if she did not receive a dose on or after her 16th birthday. Adults who have certain high-risk  conditions should receive one or more doses of vaccine.  Hepatitis A vaccine. Adults who wish to be protected from this disease, have certain high-risk conditions, work with hepatitis A-infected animals, work in hepatitis A research labs, or travel to or work in countries with a high rate of hepatitis A should be immunized. Adults who were previously unvaccinated and who anticipate close contact with an international adoptee during the first 60 days after arrival in the [place] from a country with a high rate of hepatitis A should be immunized.  Hepatitis B vaccine.  Adults who wish to be protected from this disease, have certain high-risk conditions, may be exposed to blood or other infectious body fluids, are household contacts or sex partners of hepatitis B positive people, are clients or workers in certain care facilities, or travel to or work in countries with a high rate of hepatitis B should be immunized.  Haemophilus influenzae type b (Hib) vaccine. A previously unvaccinated person with asplenia or sickle cell disease or having a scheduled splenectomy should receive 1 dose of Hib vaccine. Regardless of previous immunization, a recipient of a hematopoietic stem cell transplant should receive a 3-dose series 6 12 months after her successful transplant. Hib vaccine is not recommended for adults with HIV infection.  Preventive Services / Frequency Ages 23 to 39years  Blood pressure check.** / Every 1 to 2 years.  Lipid and cholesterol check.** / Every 5 years beginning at age 25.  Clinical breast exam.** / Every 3 years for women in their 73s and 29s.  BRCA-related cancer risk assessment.** / For women who have family members with a BRCA-related cancer (breast, ovarian, tubal, or peritoneal cancers).  Pap test.** / Every 2 years from ages 23 through 52. Every 3 years starting at age 52 through age 63 or 66 with a history of 3 consecutive normal Pap tests.  HPV screening.** / Every 3  years from ages 17 through ages 26 to 37 with a history of 3 consecutive normal Pap tests.  Hepatitis C blood test.** / For any individual with known risks for hepatitis C.  Skin self-exam. / Monthly.  Influenza vaccine. / Every year.  Tetanus, diphtheria, and acellular pertussis (Tdap, Td) vaccine.** / Consult your health care provider. Pregnant women should receive 1 dose of Tdap vaccine during each pregnancy. 1 dose of Td every 10 years.  Varicella vaccine.** / Consult your health care provider. Pregnant females who do not have evidence of immunity should receive the first dose after pregnancy.  HPV vaccine. / 3 doses over 6 months, if 15 and younger. The vaccine is not recommended for use in pregnant females. However, pregnancy testing is not needed before receiving a dose.  Measles, mumps, rubella (MMR) vaccine.** / You need at least 1 dose of MMR if you were born in 1957 or later. You may also need a 2nd dose. For females of childbearing age, rubella immunity should be determined. If there is no evidence of immunity, females who are not pregnant should be vaccinated. If there is no evidence of immunity, females who  are pregnant should delay immunization until after pregnancy.  Pneumococcal 13-valent conjugate (PCV13) vaccine.** / Consult your health care provider.  Pneumococcal polysaccharide (PPSV23) vaccine.** / 1 to 2 doses if you smoke cigarettes or if you have certain conditions.  Meningococcal vaccine.** / 1 dose if you are age 76 to 65 years and a Market researcher living in a residence hall, or have one of several medical conditions, you need to get vaccinated against meningococcal disease. You may also need additional booster doses.  Hepatitis A vaccine.** / Consult your health care provider.  Hepatitis B vaccine.** / Consult your health care provider.  Haemophilus influenzae type b (Hib) vaccine.** / Consult your health care provider.  Ages 68 to 64years  Blood  pressure check.** / Every 1 to 2 years.  Lipid and cholesterol check.** / Every 5 years beginning at age 58 years.  Lung cancer screening. / Every year if you are aged 48 80 years and have a 30-pack-year history of smoking and currently smoke or have quit within the past 15 years. Yearly screening is stopped once you have quit smoking for at least 15 years or develop a health problem that would prevent you from having lung cancer treatment.  Clinical breast exam.** / Every year after age 51 years.  BRCA-related cancer risk assessment.** / For women who have family members with a BRCA-related cancer (breast, ovarian, tubal, or peritoneal cancers).  Mammogram.** / Every year beginning at age 2 years and continuing for as long as you are in good health. Consult with your health care provider.  Pap test.** / Every 3 years starting at age 47 years through age 21 or 44 years with a history of 3 consecutive normal Pap tests.  HPV screening.** / Every 3 years from ages 63 years through ages 66 to 69 years with a history of 3 consecutive normal Pap tests.  Fecal occult blood test (FOBT) of stool. / Every year beginning at age 64 years and continuing until age 20 years. You may not need to do this test if you get a colonoscopy every 10 years.  Flexible sigmoidoscopy or colonoscopy.** / Every 5 years for a flexible sigmoidoscopy or every 10 years for a colonoscopy beginning at age 52 years and continuing until age 59 years.  Hepatitis C blood test.** / For all people born from [birth year] through 1965 and any individual with known risks for hepatitis C.  Skin self-exam. / Monthly.  Influenza vaccine. / Every year.  Tetanus, diphtheria, and acellular pertussis (Tdap/Td) vaccine.** / Consult your health care provider. Pregnant women should receive 1 dose of Tdap vaccine during each pregnancy. 1 dose of Td every 10 years.  Varicella vaccine.** / Consult your health care provider. Pregnant females who do not  have evidence of immunity should receive the first dose after pregnancy.  Zoster vaccine.** / 1 dose for adults aged 65 years or older.  Measles, mumps, rubella (MMR) vaccine.** / You need at least 1 dose of MMR if you were born in 1957 or later. You may also need a 2nd dose. For females of childbearing age, rubella immunity should be determined. If there is no evidence of immunity, females who are not pregnant should be vaccinated. If there is no evidence of immunity, females who are pregnant should delay immunization until after pregnancy.  Pneumococcal 13-valent conjugate (PCV13) vaccine.** / Consult your health care provider.  Pneumococcal polysaccharide (PPSV23) vaccine.** / 1 to 2 doses if you smoke cigarettes or if you have certain  conditions.  Meningococcal vaccine.** / Consult your health care provider.  Hepatitis A vaccine.** / Consult your health care provider.  Hepatitis B vaccine.** / Consult your health care provider.  Haemophilus influenzae type b (Hib) vaccine.** / Consult your health care provider.  Ages 54 years and over  Blood pressure check.** / Every 1 to 2 years.  Lipid and cholesterol check.** / Every 5 years beginning at age 59 years.  Lung cancer screening. / Every year if you are aged 64 80 years and have a 30-pack-year history of smoking and currently smoke or have quit within the past 15 years. Yearly screening is stopped once you have quit smoking for at least 15 years or develop a health problem that would prevent you from having lung cancer treatment.  Clinical breast exam.** / Every year after age 71 years.  BRCA-related cancer risk assessment.** / For women who have family members with a BRCA-related cancer (breast, ovarian, tubal, or peritoneal cancers).  Mammogram.** / Every year beginning at age 65 years and continuing for as long as you are in good health. Consult with your health care provider.  Pap test.** / Every 3 years starting at age 64  years through age 49 or 76 years with 3 consecutive normal Pap tests. Testing can be stopped between 65 and 70 years with 3 consecutive normal Pap tests and no abnormal Pap or HPV tests in the past 10 years.  HPV screening.** / Every 3 years from ages 26 years through ages 28 or 42 years with a history of 3 consecutive normal Pap tests. Testing can be stopped between 65 and 70 years with 3 consecutive normal Pap tests and no abnormal Pap or HPV tests in the past 10 years.  Fecal occult blood test (FOBT) of stool. / Every year beginning at age 46 years and continuing until age 10 years. You may not need to do this test if you get a colonoscopy every 10 years.  Flexible sigmoidoscopy or colonoscopy.** / Every 5 years for a flexible sigmoidoscopy or every 10 years for a colonoscopy beginning at age 69 years and continuing until age 65 years.  Hepatitis C blood test.** / For all people born from [birth year] through 1963 and any individual with known risks for hepatitis C.  Osteoporosis screening.** / A one-time screening for women ages 67 years and over and women at risk for fractures or osteoporosis.  Skin self-exam. / Monthly.  Influenza vaccine. / Every year.  Tetanus, diphtheria, and acellular pertussis (Tdap/Td) vaccine.** / 1 dose of Td every 10 years.  Varicella vaccine.** / Consult your health care provider.  Zoster vaccine.** / 1 dose for adults aged 30 years or older.  Pneumococcal 13-valent conjugate (PCV13) vaccine.** / Consult your health care provider.  Pneumococcal polysaccharide (PPSV23) vaccine.** / 1 dose for all adults aged 8 years and older.  Meningococcal vaccine.** / Consult your health care provider.  Hepatitis A vaccine.** / Consult your health care provider.  Hepatitis B vaccine.** / Consult your health care provider.  Haemophilus influenzae type b (Hib) vaccine.** / Consult your health care provider. ** Family history and personal history of risk and conditions may  change your health care provider's recommendations. Document Released: 03/31/2001 Document Revised: 11/23/2012  [facility] Patient Information 2014 [place], [place].   EXERCISE AND DIET:  We recommended that you start or continue a regular exercise program for good health. Regular exercise means any activity that makes your heart beat faster and makes you sweat.  We recommend  exercising at least 30 minutes per day at least 3 days a week, preferably 5.  We also recommend a diet low in fat and sugar / carbohydrates.  Inactivity, poor dietary choices and obesity can cause diabetes, heart attack, stroke, and kidney damage, among others.     ALCOHOL AND SMOKING:  Women should limit their alcohol intake to no more than 7 drinks/beers/glasses of wine (combined, not each!) per week. Moderation of alcohol intake to this level decreases your risk of breast cancer and liver damage.  ( And of course, no recreational drugs are part of a healthy lifestyle.)  Also, you should not be smoking at all or even being exposed to second hand smoke. Most people know smoking can cause cancer, and various heart and lung diseases, but did you know it also contributes to weakening of your bones?  Aging of your skin?  Yellowing of your teeth and nails?   CALCIUM AND VITAMIN D:  Adequate intake of calcium and Vitamin D are recommended.  The recommendations for exact amounts of these supplements seem to change often, but generally speaking 600 mg of calcium (either carbonate or citrate) and 800 units of Vitamin D per day seems prudent. Certain women may benefit from higher intake of Vitamin D.  If you are among these women, your doctor will have told you during your visit.     PAP SMEARS:  Pap smears, to check for cervical cancer or precancers,  have traditionally been done yearly, although recent scientific advances have shown that most women can have pap smears less often.  However, every woman still should have a physical exam  from her gynecologist or primary care physician every year. It will include a breast check, inspection of the vulva and vagina to check for abnormal growths or skin changes, a visual exam of the cervix, and then an exam to evaluate the size and shape of the uterus and ovaries.  And after 48 years of age, a rectal exam is indicated to check for rectal cancers. We will also provide age appropriate advice regarding health maintenance, like when you should have certain vaccines, screening for sexually transmitted diseases, bone density testing, colonoscopy, mammograms, etc.    MAMMOGRAMS:  All women over 26 years old should have a yearly mammogram. Many facilities now offer a "3D" mammogram, which may cost around $50 extra out of pocket. If possible,  we recommend you accept the option to have the 3D mammogram performed.  It both reduces the number of women who will be called back for extra views which then turn out to be normal, and it is better than the routine mammogram at detecting truly abnormal areas.     COLONOSCOPY:  Colonoscopy to screen for colon cancer is recommended for all women at age 23.  We know, you hate the idea of the prep.  We agree, BUT, having colon cancer and not knowing it is worse!!  Colon cancer so often starts as a polyp that can be seen and removed at colonscopy, which can quite literally save your life!  And if your first colonoscopy is normal and you have no family history of colon cancer, most women don't have to have it again for 10 years.  Once every ten years, you can do something that may end up saving your life, right?  We will be happy to help you get it scheduled when you are ready.  Be sure to check your insurance coverage so you understand how much it  will cost.  It may be covered as a preventative service at no cost, but you should check your particular policy.

## 2015-12-30 NOTE — Progress Notes (Signed)
Impression and Recommendations:    1. Routine physical examination   2. Family history of breast cancer   3. Need for Tdap vaccination   4. Need for prophylactic vaccination and inoculation against influenza   5. Special screening for malignant neoplasms, colon   6. Family history of thyroid cancer   7. Leukocytosis, unspecified type   8. Screening for multiple conditions   9. BMI 37.0-37.9, adult     Please see orders section below for further details of actions taken during this office visit.  Gross side effects, risk and benefits, and alternatives of medications discussed with patient.  Patient is aware that all medications have potential side effects and we are unable to predict every side effect or drug-drug interaction that may occur.  Expresses verbal understanding and consents to current therapy plan and treatment regiment.  1) Anticipatory Guidance: Discussed importance of wearing a seatbelt while driving, not texting while driving; sunscreen when outside along with yearly skin surveillance; eating a well balanced and modest diet; physical activity at least 25 minutes per day or 150 min/ week of moderate to intense activity.  2) Immunizations / Screenings / Labs:  All immunizations and screenings that patient agrees to, are up-to-date per recommendations or will be updated today.  Patient understands the needs for q 15modental and yearly vision screens which pt will schedule independently. Obtain CBC, CMP, HgA1c, Lipid panel, TSH and vit D when fasting if not already done recently.  Also ordered Mammo, HOB stool, HIV, Hep C.  Flu and TDap updated  3) Weight:   Discussed goal of losing even 5-10% of current body weight which would improve overall feelings of well being and improve objective health data significantly.   Improve nutrient density of diet through increasing intake of fruits and vegetables and decreasing saturated/trans fats, white flour products and refined sugar  products.   F-up preventative CPE in 1 year. F/up sooner for chronic care management as discussed and/or prn.  Please see orders placed and AVS handed out to patient at the end of our visit for further patient instructions/ counseling done pertaining to today's office visit.     Subjective:    HPI: TALEYAH BALIKis a 50y.o. female [facility].  Health Maintenance Summary Reviewed and updated, unless pt declines services.  Aspirin: n/a Colonoscopy:     (Unnecessary secondary to < 567or > 738years old.) Tdap: needs TD  Tobacco History Reviewed:   Y  CT scan for screening lung CA:   n/a Abdominal Ultrasound:    N/a  ( Unnecessary secondary to < 692or > 765years old) Alcohol:    No concerns, no excessive use Exercise Habits:   Not meeting AHA guidelines; not exercising yet STD concerns:   no Drug Use:   None Birth control method:   Vasectomy in hubby Menses regular:     Yes due to novasure ablation Lumps or breast concerns:      no Breast Cancer Family History:      Yes- aunt in 434'sand 58'sand mom's twin sister in 670'sand mat GM in 754+age. Mammo-  yrly each Dec Bone/ DEXA scan:    n/a ( Unnecessary due to < 65 and average risk)  [email or web address]   Wt Readings from Last 3 Encounters:  12/30/15 193 lb 4.8 oz (87.7 kg)  11/20/15 193 lb (87.5 kg)  10/16/15 191 lb 8 oz (86.9 kg)   BP Readings from Last 3 Encounters:  12/30/15 125/85  11/20/15 125/89  10/30/15 122/78   Pulse Readings from Last 3 Encounters:  12/30/15 69  11/20/15 71  10/16/15 75     Past Medical History:  Diagnosis Date  . Endometriosis   . 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 ATM mutation is a variant of unknown significance. No specific recommendations for follow up based on this made.  . 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 BREAST SURGERY     Breast Bx benign  . ENDOMETRIAL ABLATION    . FOOT SURGERY    . NOVASURE ABLATION  10/2004  . PELVIC LAPAROSCOPY  12/2002   endometriosis     Family History  Problem Relation Age of Onset  . Hypertension Father   . Hyperlipidemia Father   . Stroke Father   . Breast cancer Maternal Aunt 67    mother's identical twin  . Cancer Maternal Aunt     breast  . Cancer Maternal Grandfather     lung  . Diabetes Paternal Grandmother   . Breast cancer Maternal Grandmother     dx in her late 10s to early 79s  . Cancer Maternal Grandmother     breast  . Breast cancer Maternal Aunt 49    second cancer at  77  . Cancer Maternal Aunt     breast x 2  . Leukemia Paternal Grandfather   . Cancer Paternal Grandfather     leukemia  . Hypertension Mother   . Thyroid cancer Sister     dx in her early to mid 56s  . Cancer Sister     thyroid  . Breast cancer Paternal Aunt 71     History  Drug Use No  ,   History  Alcohol Use  . 0.6 oz/week  . 1 Glasses of wine per week  ,   History  Smoking Status  . Never Smoker  Smokeless Tobacco  . Never Used  ,   History  Sexual Activity  . Sexual activity: Yes  . Birth control/ protection: Surgical    Comment: 1st intercourse 20 yo-5 partners-Vasectomy    Current Outpatient Prescriptions on File Prior to Visit  Medication Sig Dispense Refill  . ALPRAZolam (XANAX) 0.5 MG tablet Take 1 tablet (0.5 mg total) by mouth at bedtime as needed. 30 tablet 2  . cetirizine (ZYRTEC) 10 MG tablet Take 10 mg by mouth as needed for allergies.     No current facility-administered medications on file prior to visit.     Allergies: Patient has no known allergies.  Review of Systems  Constitutional: Negative.  Negative for chills, diaphoresis, fever, malaise/fatigue and weight loss.    HENT: Negative.  Negative for congestion, sore throat and tinnitus.   Eyes: Negative.  Negative for blurred vision, double vision and photophobia.  Respiratory: Negative.  Negative for cough and wheezing.   Cardiovascular: Negative.  Negative for chest pain and palpitations.  Gastrointestinal: Negative.  Negative for blood in stool, diarrhea, nausea and vomiting.  Genitourinary: Negative.  Negative for dysuria, frequency and urgency.  Musculoskeletal: Negative.  Negative for joint pain and myalgias.  Skin: Negative.  Negative for itching and rash.  Neurological: Negative.  Negative for  dizziness, focal weakness, weakness and headaches.  Endo/Heme/Allergies: Negative.  Negative for environmental allergies and polydipsia. Does not bruise/bleed easily.  Psychiatric/Behavioral: Negative.  Negative for depression and memory loss. The patient is not nervous/anxious and does not have insomnia.     Objective:   Blood pressure 125/85, pulse 69, height 5' (1.524 m), weight 193 lb 4.8 oz (87.7 kg), last menstrual period 12/25/2015. Body mass index is 37.75 kg/m. General Appearance:    Alert, cooperative, no distress, appears stated age  Head:    Normocephalic, without obvious abnormality, atraumatic  Eyes:    PERRL, conjunctiva/corneas clear, EOM's intact, fundi    benign, both eyes  Ears:    Normal TM's and external ear canals, both ears  Nose:   Nares normal, septum midline, mucosa normal, no drainage    or sinus tenderness  Throat:   Lips w/o lesion, mucosa moist, and tongue normal; teeth and   gums normal  Neck:   Supple, symmetrical, trachea midline, no adenopathy;    thyroid:  no enlargement/tenderness/nodules; no carotid   bruit or JVD  Back:     Symmetric, no curvature, ROM normal, no CVA tenderness  Lungs:     Clear to auscultation bilaterally, respirations unlabored, no       Wh/ R/ R  Chest Wall:    No tenderness or gross deformity; normal excursion   Heart:    Regular rate and  rhythm, S1 and S2 normal, no murmur, rub   or gallop  Breast Exam:    No tenderness, masses, or nipple abnormality b/l; no d/c  Abdomen:     Soft, non-tender, bowel sounds active all four quadrants, NO   G/R/R, no masses, no organomegaly  Genitalia:   deferred  Rectal:    Normal tone, no masses or tenderness;   guaiac negative stool  Extremities:   Extremities normal, atraumatic, no cyanosis or gross edema  Pulses:   2+ and symmetric all extremities  Skin:   Warm, dry, Skin color, texture, turgor normal, no obvious rashes or lesions Psych: No HI/SI, judgement and insight good, Euthymic mood. Full Affect.  Neurologic:   CNII-XII intact, normal strength, sensation and reflexes    Throughout

## 2015-12-31 ENCOUNTER — Other Ambulatory Visit: Payer: Self-pay | Admitting: Family Medicine

## 2015-12-31 DIAGNOSIS — Z1231 Encounter for screening mammogram for malignant neoplasm of breast: Secondary | ICD-10-CM

## 2015-12-31 DIAGNOSIS — Z1389 Encounter for screening for other disorder: Secondary | ICD-10-CM | POA: Insufficient documentation

## 2015-12-31 DIAGNOSIS — D72829 Elevated white blood cell count, unspecified: Secondary | ICD-10-CM | POA: Insufficient documentation

## 2015-12-31 LAB — CBC WITH DIFFERENTIAL/PLATELET
BASOS PCT: 0 %
Basophils Absolute: 0 cells/uL (ref 0–200)
EOS ABS: 114 {cells}/uL (ref 15–500)
Eosinophils Relative: 1 %
HEMATOCRIT: 40.3 % (ref 35.0–45.0)
HEMOGLOBIN: 13.2 g/dL (ref 11.7–15.5)
LYMPHS ABS: 4218 {cells}/uL — AB (ref 850–3900)
LYMPHS PCT: 37 %
MCH: 29.5 pg (ref 27.0–33.0)
MCHC: 32.8 g/dL (ref 32.0–36.0)
MCV: 90.2 fL (ref 80.0–100.0)
MONO ABS: 570 {cells}/uL (ref 200–950)
MPV: 9 fL (ref 7.5–12.5)
Monocytes Relative: 5 %
NEUTROS ABS: 6498 {cells}/uL (ref 1500–7800)
Neutrophils Relative %: 57 %
Platelets: 394 10*3/uL (ref 140–400)
RBC: 4.47 MIL/uL (ref 3.80–5.10)
RDW: 13.2 % (ref 11.0–15.0)
WBC: 11.4 10*3/uL — ABNORMAL HIGH (ref 3.8–10.8)

## 2015-12-31 LAB — COMPREHENSIVE METABOLIC PANEL
ALBUMIN: 4 g/dL (ref 3.6–5.1)
ALT: 12 U/L (ref 6–29)
AST: 15 U/L (ref 10–35)
Alkaline Phosphatase: 93 U/L (ref 33–115)
BUN: 17 mg/dL (ref 7–25)
CHLORIDE: 103 mmol/L (ref 98–110)
CO2: 28 mmol/L (ref 20–31)
CREATININE: 0.65 mg/dL (ref 0.50–1.10)
Calcium: 9.7 mg/dL (ref 8.6–10.2)
Glucose, Bld: 93 mg/dL (ref 65–99)
POTASSIUM: 5.1 mmol/L (ref 3.5–5.3)
SODIUM: 141 mmol/L (ref 135–146)
Total Bilirubin: 0.5 mg/dL (ref 0.2–1.2)
Total Protein: 7.3 g/dL (ref 6.1–8.1)

## 2015-12-31 LAB — VITAMIN D 25 HYDROXY (VIT D DEFICIENCY, FRACTURES): VIT D 25 HYDROXY: 34 ng/mL (ref 30–100)

## 2015-12-31 LAB — TSH: TSH: 2.03 mIU/L

## 2015-12-31 LAB — T4, FREE: Free T4: 0.9 ng/dL (ref 0.8–1.8)

## 2015-12-31 LAB — HEMOGLOBIN A1C
Hgb A1c MFr Bld: 5.3 % (ref <5.7)
MEAN PLASMA GLUCOSE: 105 mg/dL

## 2015-12-31 LAB — HIV ANTIBODY (ROUTINE TESTING W REFLEX): HIV: NONREACTIVE

## 2016-02-12 ENCOUNTER — Ambulatory Visit
Admission: RE | Admit: 2016-02-12 | Discharge: 2016-02-12 | Disposition: A | Discharge status: Home / Self Care | Payer: 59 | Source: Ambulatory Visit | Attending: Family Medicine | Admitting: Family Medicine

## 2016-02-12 DIAGNOSIS — Z1231 Encounter for screening mammogram for malignant neoplasm of breast: Secondary | ICD-10-CM

## 2016-02-24 ENCOUNTER — Encounter: Payer: Self-pay | Admitting: Family Medicine

## 2016-02-24 ENCOUNTER — Ambulatory Visit (INDEPENDENT_AMBULATORY_CARE_PROVIDER_SITE_OTHER): Payer: 59 | Admitting: Family Medicine

## 2016-02-24 VITALS — BP 135/88 | HR 76 | Temp 98.2°F | Ht 60.0 in | Wt 199.5 lb

## 2016-02-24 DIAGNOSIS — N949 Unspecified condition associated with female genital organs and menstrual cycle: Secondary | ICD-10-CM

## 2016-02-24 DIAGNOSIS — K644 Residual hemorrhoidal skin tags: Secondary | ICD-10-CM | POA: Diagnosis not present

## 2016-02-24 DIAGNOSIS — A6004 Herpesviral vulvovaginitis: Secondary | ICD-10-CM

## 2016-02-24 DIAGNOSIS — R309 Painful micturition, unspecified: Secondary | ICD-10-CM

## 2016-02-24 DIAGNOSIS — K649 Unspecified hemorrhoids: Secondary | ICD-10-CM

## 2016-02-24 DIAGNOSIS — A6000 Herpesviral infection of urogenital system, unspecified: Secondary | ICD-10-CM | POA: Insufficient documentation

## 2016-02-24 LAB — POCT URINALYSIS DIPSTICK
Bilirubin, UA: NEGATIVE
Glucose, UA: NEGATIVE
Ketones, UA: NEGATIVE
Leukocytes, UA: NEGATIVE
Nitrite, UA: NEGATIVE
PH UA: 6
PROTEIN UA: NEGATIVE
SPEC GRAV UA: 1.02
Urobilinogen, UA: 0.2

## 2016-02-24 MED ORDER — HYDROCORTISONE ACE-PRAMOXINE 1-1 % RE FOAM: 1.0000 | Freq: Four times a day (QID) | RECTAL | 2 refills | Status: DC

## 2016-02-24 MED ORDER — VALACYCLOVIR HCL 1 G PO TABS: 1000.0000 mg | ORAL_TABLET | Freq: Two times a day (BID) | ORAL | 2 refills | Status: DC

## 2016-02-24 NOTE — Patient Instructions (Addendum)
Please get urine for GC, chlamydia, gonorrhea, yeast.   Blood work- full STD panel today    For hemorrhoids, do not strain!!   Avoid any activities that increase intra-abdominal pressure.   Increase water intake, MiraLAX twice daily and use Colace or Peri-Colace to help soften the stool and moving along.   Use Proctofoam as directed.     - Genital Herpes Genital herpes is a common sexually transmitted infection (STI) that is caused by a virus. The virus is spread from person to person through sexual contact. Infection can cause itching, blisters, and sores in the genital area or rectal area. This is called an outbreak. It affects both men and women. Genital herpes is particularly concerning for pregnant women because the virus can be passed to the baby during delivery and cause serious problems. Genital herpes is also a concern for people with a weakened defense (immune) system. Symptoms of genital herpes may last several days and then go away. However, the virus remains in your body, so you may have more outbreaks of symptoms in the future. The time between outbreaks varies and can be months or years. CAUSES Genital herpes is caused by a virus called herpes simplex virus (HSV) type 2 or HSV type 1. These viruses are contagious and are most often spread through sexual contact with an infected person. Sexual contact includes vaginal, anal, and oral sex. RISK FACTORS Risk factors for genital herpes include:  Being sexually active with multiple partners.  Having unprotected sex. SIGNS AND SYMPTOMS Symptoms may include:  Pain and itching in the genital area or rectal area.  Small red bumps that turn into blisters and then turn into sores.  Flu-like symptoms, including:  Fever.  Body aches.  Painful urination.  Vaginal discharge. DIAGNOSIS Genital herpes may be diagnosed by:  Physical exam.  Blood test.  Fluid culture test from an open sore. TREATMENT There is no cure for  genital herpes. Oral antiviral medicines may be used to speed up healing and to help prevent the return of symptoms. These medicines can also help to reduce the spread of the virus to sexual partners. HOME CARE INSTRUCTIONS  Keep the affected areas dry and clean.  Take medicines only as directed by your health care provider.  Do not have sexual contact during active infections. Genital herpes is contagious.  Practice safe sex. Latex condoms and female condoms may help to prevent the spread of the herpes virus.  Avoid rubbing or touching the blisters and sores. If you do touch the blister or sores:  Wash your hands thoroughly.  Do not touch your eyes afterward.  If you become pregnant, tell your health care provider if you have had genital herpes.  Keep all follow-up visits as directed by your health care provider. This is important. PREVENTION  Use condoms. Although anyone can contract genital herpes during sexual contact even with the use of a condom, a condom can provide some protection.  Avoid having multiple sexual partners.  Talk to your sexual partner about any symptoms and past history that either of you may have.  Get tested before you have sex. Ask your partner to do the same.  Recognize the symptoms of genital herpes. Do not have sexual contact if you notice these symptoms. SEEK MEDICAL CARE IF:  Your symptoms are not improving with medicine.  Your symptoms return.  You have new symptoms.  You have a fever.  You have abdominal pain.  You have redness, swelling, or pain in  your eye. MAKE SURE YOU:  Understand these instructions.  Will watch your condition.  Will get help right away if you are not doing well or get worse. This information is not intended to replace advice given to you by your health care provider. Make sure you discuss any questions you have with your health care provider. Document Released: 01/31/2000 Document Revised: 02/23/2014 Document  Reviewed: 06/20/2013 Elsevier Interactive Patient Education  2017 [employer].       About Hemorrhoids  Hemorrhoids are swollen veins in the lower rectum and anus.  Also called piles, hemorrhoids are a common problem.  Hemorrhoids may be internal (inside the rectum) or external (around the anus).  Internal Hemorrhoids  Internal hemorrhoids are often painless, but they rarely cause bleeding.  The internal veins may stretch and fall down (prolapse) through the anus to the outside of the body.  The veins may then become irritated and painful.  External Hemorrhoids  External hemorrhoids can be easily seen or felt around the anal opening.  They are under the skin around the anus.  When the swollen veins are scratched or broken by straining, rubbing or wiping they sometimes bleed.  How Hemorrhoids Occur  Veins in the rectum and around the anus tend to swell under pressure.  Hemorrhoids can result from increased pressure in the veins of your anus or rectum.  Some sources of pressure are:   Straining to have a bowel movement because of constipation  Waiting too long to have a bowel movement  Coughing and sneezing often  Sitting for extended periods of time, including on the toilet  Diarrhea  Obesity  Trauma or injury to the anus  Some liver diseases  Stress  Family history of hemorrhoids  Pregnancy  Pregnant women should try to avoid becoming constipated, because they are more likely to have hemorrhoids during pregnancy.  In the last trimester of pregnancy, the enlarged uterus may press on blood vessels and causes hemorrhoids.  In addition, the strain of childbirth sometimes causes hemorrhoids after the birth.  Symptoms of Hemorrhoids  Some symptoms of hemorrhoids include:  Swelling and/or a tender lump around the anus  Itching, mild burning and bleeding around the anus  Painful bowel movements with or without constipation  Bright red blood covering the stool, on  toilet paper or in the toilet bowel.   Symptoms usually go away within a few days.  Always talk to your doctor about any bleeding to make sure it is not from some other causes.  Diagnosing and Treating Hemorrhoids  Diagnosis is made by an examination by your healthcare provider.  Special test can be performed by your doctor.    Most cases of hemorrhoids can be treated with:  High-fiber diet: Eat more high-fiber foods, which help prevent constipation.  Ask for more detailed fiber information on types and sources of fiber from your healthcare provider.  Fluids: Drink plenty of water.  This helps soften bowel movements so they are easier to pass.  Sitz baths and cold packs: Sitting in lukewarm water two or three times a day for 15 minutes cleases the anal area and may relieve discomfort.  If the water is too hot, swelling around the anus will get worse.  Placing a cloth-covered ice pack on the anus for ten minutes four times a day can also help reduce selling.  Gently pushing a prolapsed hemorrhoid back inside after the bath or ice pack can be helpful.  Medications: For mild discomfort, your healthcare provider  may suggest over-the-counter pain medication or prescribe a cream or ointment for topical use.  The cream may contain witch hazel, zinc oxide or petroleum jelly.  Medicated suppositories are also a treatment option.  Always consult your doctor before applying medications or creams.  Procedures and surgeries: There are also a number of procedures and surgeries to shrink or remove hemorrhoids in more serious cases.  Talk to your physician about these options.  You can often prevent hemorrhoids or keep them from becoming worse by maintaining a healthy lifestyle.  Eat a fiber-rich diet of fruits, vegetables and whole grains.  Also, drink plenty of water and exercise regularly.   2007, [employer] Doc.30

## 2016-02-24 NOTE — Progress Notes (Signed)
Subjective:    HPI: Angela Ramsey is a 51 y.o. female who presents to [place] at [facility] today for c/o dysuria.  Sx for 14 days- took azo last week- just a little help days.  Awoke Sat with raised lesions.  underwear area.   C/O: + dysuria; and burns even when not peeing.    + increased frequency - increased urgency - malodorous urination - Nausea - Fever/ Chills - Back Pain - vaginal discharge No worry for STD  Monogamous currently; partner w/o STI/ sx--> husband w/o sx no ABX usage past 30 d  2) also complains of ongoing rectal pain of pressure and itching on and off for over 1.5 years. Patient has been more stressed than usual lately and has been constipated and straining a lot.    Urinalysis    Component Value Date/Time   COLORURINE YELLOW 10/30/2015 1228   APPEARANCEUR CLEAR 10/30/2015 1228   LABSPEC 1.010 10/30/2015 1228   PHURINE 7.0 10/30/2015 1228   GLUCOSEU NEGATIVE 10/30/2015 1228   HGBUR TRACE (A) 10/30/2015 1228   BILIRUBINUR negative 02/24/2016 1321   KETONESUR NEGATIVE 10/30/2015 1228   PROTEINUR negative 02/24/2016 1321   PROTEINUR NEGATIVE 10/30/2015 1228   UROBILINOGEN 0.2 02/24/2016 1321   UROBILINOGEN 0.2 08/15/2014 1015   NITRITE negative 02/24/2016 1321   NITRITE NEGATIVE 10/30/2015 1228   LEUKOCYTESUR Negative 02/24/2016 1321    Wt Readings from Last 3 Encounters:  02/24/16 199 lb 8 oz (90.5 kg)  12/30/15 193 lb 4.8 oz (87.7 kg)  11/20/15 193 lb (87.5 kg)   BP Readings from Last 3 Encounters:  02/24/16 135/88  12/30/15 125/85  11/20/15 125/89   Pulse Readings from Last 3 Encounters:  02/24/16 76  12/30/15 69  11/20/15 71   BMI Readings from Last 3 Encounters:  02/24/16 38.96 kg/m  12/30/15 37.75 kg/m  11/20/15 37.69 kg/m     Patient Active Problem List   Diagnosis Date Noted  . Genital herpes 02/24/2016    Priority: High  . BMI 37.0-37.9, adult 10/16/2015    Priority: High  . GAD  (generalized anxiety disorder) 10/16/2015    Priority: High  . Nonspecific chest pain 10/16/2015    Priority: High  . Hemorrhoids- small external nonthrombosed hemorrhoid perirectal area at 10 PM 02/24/2016  . External hemorrhoids without complication 0000000  . Screening for multiple conditions 12/31/2015  . Leukocytosis 12/31/2015  . Environmental and seasonal allergies 12/01/2015  . Family history of early CAD 10/16/2015  . Genetic testing 09/21/2014  . Family history of breast cancer   . Family history of thyroid cancer   . h/o ovarian Endometriosis     Past Surgical History:  Procedure Laterality Date  . BREAST SURGERY     Breast Bx benign  . ENDOMETRIAL ABLATION    . FOOT SURGERY    . NOVASURE ABLATION  10/2004  . PELVIC LAPAROSCOPY  12/2002   endometriosis    Family History  Problem Relation Age of Onset  . Hypertension Father   . Hyperlipidemia Father   . Stroke Father   . Breast cancer Maternal Aunt 67       mother's identical twin  . Cancer Maternal Aunt        breast  . Cancer Maternal Grandfather        lung  . Diabetes Paternal Grandmother   . Breast cancer Maternal Grandmother        dx in her late 66s  to early 89s  . Cancer Maternal Grandmother        breast  . Breast cancer Maternal Aunt 49       second cancer at  47  . Cancer Maternal Aunt        breast x 2  . Leukemia Paternal Grandfather   . Cancer Paternal Grandfather        leukemia  . Hypertension Mother   . Thyroid cancer Sister        dx in her early to mid 51s  . Cancer Sister        thyroid  . Breast cancer Paternal Aunt 78    History  Drug Use No  ,  History  Alcohol Use  . 0.6 oz/week  . 1 Glasses of wine per week  ,  History  Smoking Status  . Never Smoker  Smokeless Tobacco  . Never Used  ,  History  Sexual Activity  . Sexual activity: Yes  . Birth control/ protection: Surgical    Comment: 1st intercourse 20 yo-5 partners-Vasectomy    Patient's Medications   New Prescriptions   HYDROCORTISONE-PRAMOXINE (PROCTOFOAM HC) RECTAL FOAM    Place 1 applicator rectally 4 (four) times daily.   VALACYCLOVIR (VALTREX) 1000 MG TABLET    Take 1 tablet (1,000 mg total) by mouth 2 (two) times daily. Must take within the first 48 hours onset of symptoms  Previous Medications   ALPRAZOLAM (XANAX) 0.5 MG TABLET    Take 1 tablet (0.5 mg total) by mouth at bedtime as needed.   CETIRIZINE (ZYRTEC) 10 MG TABLET    Take 10 mg by mouth as needed for allergies.  Modified Medications   No medications on file  Discontinued Medications   No medications on file    Patient has no known allergies.  No outpatient prescriptions have been marked as taking for the 02/24/16 encounter (Office Visit) with Mellody Dance, DO.    Review of Systems  Constitutional: Positive for malaise/fatigue. Negative for chills and fever.  Respiratory: Negative for shortness of breath.   Cardiovascular: Negative for chest pain.  Gastrointestinal: Negative for abdominal pain, diarrhea, nausea and vomiting.  Genitourinary: Positive for dysuria, frequency and urgency. Negative for flank pain and hematuria.       No Genital discharge/ complaints  Musculoskeletal: Positive for back pain.  Skin: Negative for rash.  Neurological: Negative for dizziness, focal weakness and loss of consciousness.  Endo/Heme/Allergies: Negative for polydipsia.  Psychiatric/Behavioral:       Mood- stable    Objective:  Blood pressure 135/88, pulse 76, temperature 98.2 F (36.8 C), height 5' (1.524 m), weight 199 lb 8 oz (90.5 kg). Body mass index is 38.96 kg/m.  General: Well Developed, well nourished, and in no acute distress.  HEENT: Normocephalic, atraumatic Skin: Warm and dry, cap RF less 2 sec, good turgor CV: +S1, S2 Respiratory: ECTA B/L; speaking in full sentences, no conversational dyspnea Abd: Soft, NT, ND, No G/R/R, no SPT, No flank pain NeuroM-Sk: Ambulates w/o assistance, moves * 4 Psych: A and  O *3 GU:  Exam done with patient in frog position with Bobetta Lime at my side. One small punctate open ulceration left labium minora. No rash otherwise, no vaginal discharge, no other abnormalities. Rectal:  Patient with a small external hemorrhoid that is slightly bluish in color but not incarcerated\thrombosed.    Impression and Recommendations:    1. Painful urination   2. Herpes simplex vulvovaginitis   3. Hemorrhoids, unspecified hemorrhoid  type   4. External hemorrhoids without complication   5. Genital lesion, female    Please get urine for GC, chlamydia, gonorrhea, yeast. Tx if Pos   Blood work- full STD panel today  For hemorrhoids, do not strain!!   Avoid any activities that increase intra-abdominal pressure.   Increase water intake, MiraLAX twice daily and use Colace or Peri-Colace to help soften the stool and moving along.   Use Proctofoam as directed.   The patient was counseled, risk factors were discussed, anticipatory guidance given.  Gross side effects, risk and benefits, and alternatives of medications discussed with patient.  Patient is aware that all medications have potential side effects and we are unable to predict every side effect or drug-drug interaction that may occur.  Expresses verbal understanding and consents to current therapy plan and treatment regimen.    Orders Placed This Encounter  Procedures  . Urine Culture  . GC/Chlamydia Probe Amp  . HSV(herpes simplex vrs) 1+2 ab-IgG  . RPR  . Hepatitis B Surface AntiGEN  . Hepatitis C Antibody  . HIV antibody (with reflex)  . Specimen status report  . Specimen status report  . POCT Urinalysis Dipstick      New Prescriptions   HYDROCORTISONE-PRAMOXINE (PROCTOFOAM HC) RECTAL FOAM    Place 1 applicator rectally 4 (four) times daily.   VALACYCLOVIR (VALTREX) 1000 MG TABLET    Take 1 tablet (1,000 mg total) by mouth 2 (two) times daily. Must take within the first 48 hours onset of symptoms     Modified Medications   No medications on file    Discontinued Medications   No medications on file      Return if symptoms worsen or fail to improve.

## 2016-02-25 ENCOUNTER — Telehealth: Payer: Self-pay | Admitting: Family Medicine

## 2016-02-25 LAB — HSV(HERPES SIMPLEX VRS) I + II AB-IGG
HSV 1 Glycoprotein G Ab, IgG: 19.5 index — ABNORMAL HIGH (ref 0.00–0.90)
HSV 2 Glycoprotein G Ab, IgG: <0.91 index (ref 0.00–0.90)

## 2016-02-25 LAB — RPR: RPR: NONREACTIVE

## 2016-02-25 LAB — HEPATITIS C ANTIBODY: Hep C Virus Ab: <0.1 s/co ratio (ref 0.0–0.9)

## 2016-02-25 LAB — HEPATITIS B SURFACE ANTIGEN: Hepatitis B Surface Ag: NEGATIVE

## 2016-02-25 LAB — HIV ANTIBODY (ROUTINE TESTING W REFLEX): HIV Screen 4th Generation wRfx: NONREACTIVE

## 2016-02-25 NOTE — Telephone Encounter (Signed)
[facility] rep Joey called from [phone] regarding labs on [employer] (DOB [birth year]) --pls give him a call back. Thanks,  glh

## 2016-02-26 LAB — URINE CULTURE

## 2016-02-26 LAB — SPECIMEN STATUS REPORT

## 2016-02-27 LAB — GC/CHLAMYDIA PROBE AMP
CHLAMYDIA, DNA PROBE: NEGATIVE
NEISSERIA GONORRHOEAE BY PCR: NEGATIVE

## 2016-02-27 LAB — SPECIMEN STATUS REPORT

## 2016-02-28 NOTE — Telephone Encounter (Signed)
Returned call, they faxed a verify name request.

## 2016-04-01 ENCOUNTER — Ambulatory Visit: Payer: 59 | Admitting: Family Medicine

## 2016-04-16 DIAGNOSIS — H60392 Other infective otitis externa, left ear: Secondary | ICD-10-CM | POA: Diagnosis not present

## 2016-04-27 ENCOUNTER — Ambulatory Visit: Payer: 59 | Admitting: Family Medicine

## 2016-06-12 ENCOUNTER — Ambulatory Visit: Payer: 59 | Admitting: Family Medicine

## 2016-07-01 ENCOUNTER — Encounter: Payer: Self-pay | Admitting: Gynecology

## 2016-12-18 ENCOUNTER — Ambulatory Visit (INDEPENDENT_AMBULATORY_CARE_PROVIDER_SITE_OTHER): Payer: 59 | Admitting: Gynecology

## 2016-12-18 ENCOUNTER — Encounter: Payer: Self-pay | Admitting: Gynecology

## 2016-12-18 VITALS — BP 124/82 | Ht 60.0 in | Wt 201.0 lb

## 2016-12-18 DIAGNOSIS — Z01419 Encounter for gynecological examination (general) (routine) without abnormal findings: Secondary | ICD-10-CM

## 2016-12-18 DIAGNOSIS — N951 Menopausal and female climacteric states: Secondary | ICD-10-CM | POA: Diagnosis not present

## 2016-12-18 DIAGNOSIS — N393 Stress incontinence (female) (male): Secondary | ICD-10-CM | POA: Diagnosis not present

## 2016-12-18 DIAGNOSIS — Z13220 Encounter for screening for lipoid disorders: Secondary | ICD-10-CM | POA: Diagnosis not present

## 2016-12-18 DIAGNOSIS — Z1501 Genetic susceptibility to malignant neoplasm of breast: Secondary | ICD-10-CM

## 2016-12-18 LAB — COMPREHENSIVE METABOLIC PANEL
AG RATIO: 1.3 (calc) (ref 1.0–2.5)
ALT: 12 U/L (ref 6–29)
AST: 15 U/L (ref 10–35)
Albumin: 4.1 g/dL (ref 3.6–5.1)
Alkaline phosphatase (APISO): 99 U/L (ref 33–115)
BUN: 11 mg/dL (ref 7–25)
CHLORIDE: 104 mmol/L (ref 98–110)
CO2: 25 mmol/L (ref 20–32)
CREATININE: 0.68 mg/dL (ref 0.50–1.10)
Calcium: 8.8 mg/dL (ref 8.6–10.2)
GLOBULIN: 3.1 g/dL (ref 1.9–3.7)
GLUCOSE: 88 mg/dL (ref 65–99)
POTASSIUM: 3.9 mmol/L (ref 3.5–5.3)
SODIUM: 139 mmol/L (ref 135–146)
TOTAL PROTEIN: 7.2 g/dL (ref 6.1–8.1)
Total Bilirubin: 0.4 mg/dL (ref 0.2–1.2)

## 2016-12-18 LAB — CBC WITH DIFFERENTIAL/PLATELET
BASOS PCT: 0.4 %
Basophils Absolute: 46 cells/uL (ref 0–200)
EOS ABS: 296 {cells}/uL (ref 15–500)
Eosinophils Relative: 2.6 %
HEMATOCRIT: 40.4 % (ref 35.0–45.0)
HEMOGLOBIN: 13.4 g/dL (ref 11.7–15.5)
LYMPHS ABS: 3944 {cells}/uL — AB (ref 850–3900)
MCH: 28.5 pg (ref 27.0–33.0)
MCHC: 33.2 g/dL (ref 32.0–36.0)
MCV: 85.8 fL (ref 80.0–100.0)
MONOS PCT: 4.5 %
MPV: 9.4 fL (ref 7.5–12.5)
NEUTROS ABS: 6601 {cells}/uL (ref 1500–7800)
Neutrophils Relative %: 57.9 %
Platelets: 406 10*3/uL — ABNORMAL HIGH (ref 140–400)
RBC: 4.71 10*6/uL (ref 3.80–5.10)
RDW: 12.4 % (ref 11.0–15.0)
Total Lymphocyte: 34.6 %
WBC: 11.4 10*3/uL — ABNORMAL HIGH (ref 3.8–10.8)
WBCMIX: 513 {cells}/uL (ref 200–950)

## 2016-12-18 LAB — LIPID PANEL
CHOL/HDL RATIO: 4.1 (calc) (ref <5.0)
Cholesterol: 216 mg/dL — ABNORMAL HIGH (ref <200)
HDL: 53 mg/dL (ref >50)
LDL Cholesterol (Calc): 138 mg/dL (calc) — ABNORMAL HIGH
NON-HDL CHOLESTEROL (CALC): 163 mg/dL — AB (ref <130)
Triglycerides: 128 mg/dL (ref <150)

## 2016-12-18 NOTE — Progress Notes (Signed)
Angela Ramsey [birth year] [id number]        51 y.o.  [id number] for annual gynecologic exam.  Several issues noted below.  Past medical history,surgical history, problem list, medications, allergies, family history and social history were all reviewed and documented as reviewed in the EPIC chart.  ROS:  Performed with pertinent positives and negatives included in the history, assessment and plan.   Additional significant findings : None   Exam: Wandra Scot assistant Vitals:   12/18/16 1025  BP: 124/82  Weight: 201 lb (91.2 kg)  Height: 5' (1.524 m)   Body mass index is 39.26 kg/m.  General appearance:  Normal affect, orientation and appearance. Skin: Grossly normal HEENT: Without gross lesions.  No cervical or supraclavicular adenopathy. Thyroid normal.  Lungs:  Clear without wheezing, rales or rhonchi Cardiac: RR, without RMG Abdominal:  Soft, nontender, without masses, guarding, rebound, organomegaly or hernia Breasts:  Examined lying and sitting without masses, retractions, discharge or axillary adenopathy. Pelvic:  Ext, BUS, Vagina: Normal  Cervix: Normal  Uterus: Anteverted, normal size, shape and contour, midline and mobile nontender   Adnexa: Without masses or tenderness    Anus and perineum: Normal   Rectovaginal: Normal sphincter tone without palpated masses or tenderness.    Assessment/Plan:  51 y.o. G46P2002 female for annual gynecologic exam without menses, vasectomy birth control.   1. Over one year without menses.  Having hot flushes and sweats consistent with menopause.  I reviewed the whole perimenopausal timeframe with her.  Options for management to include observation, OTC products such as soy-based, pharmacologic nonhormonal such as Effexor and HRT.  I reviewed the whole issue of HRT to include various studies such as WHI a more recent studies as well as NAMS guidelines.  Benefits as far as symptom relief and possible cardiovascular and bone health versus  risks to include thrombosis such as stroke heart attack DVT in the breast cancer issue.  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  The issues of using HRT in a patient with potential genetic risk for breast cancer reviewed.  After lengthy discussion we both agree at this point to observe.  She will call me if symptoms worsen and she wants to reconsider HRT. 2. Mammography coming due in December and I reminded her to schedule this.  We again reviewed her genetic testing results and the ATM mutation of unknown significance, along with her strong family history and her biopsy results.  Genetic counseling had calculated her lifetime risk of breast cancer between 18% and 23.9%.  I reviewed the ACS recommendations for annual MRI screening at 20%.  Options to initiate that now discussed and offered.  Also discussed the insurance issues and potential financial issues.  At this point the patient declines scheduling but would prefer to think about it.  She will call if she wants to pursue MRI screening. 3. Urinary incontinence.  Patient with classic stress urinary incontinence with loss of urine with coughing sneezing laughing.  Currently getting better from a cold and had a lot of urinary incontinence with this.  No urgency symptoms.  No UTI symptoms.  Will check urine analysis today.  Discussed options to include observation, behavior modification and Kegel exercises as well as urology referral to consider surgery.  At this point the patient prefers just to monitor.  She will call if she would like a urology referral. 4.  Pap smear/HPV 07/2013.  No Pap smear done today.  No history of significant abnormal Pap smears.  Plan repeat Pap smear at 5-year interval per current screening guidelines. 5. Health maintenance.  Patient requests baseline labs.  CBC, CMP, lipid profile, urine analysis ordered.  Follow-up in 1  year, sooner as needed.  Additional time in excess of her routine gynecologic exam was spent in direct face to face counseling and coordination of care in regards to her perimenopausal symptoms, breast cancer screening issues and urinary incontinence.    [name] MD, 10:54 AM 12/18/2016

## 2016-12-18 NOTE — Patient Instructions (Signed)
Schedule your mammogram in December when due.  Call if you want a urology referral in reference to the urinary incontinence.  Call if you want to rediscuss hormone replacement therapy.

## 2016-12-20 LAB — URINALYSIS W MICROSCOPIC + REFLEX CULTURE
Bacteria, UA: NONE SEEN /HPF
Bilirubin Urine: NEGATIVE
GLUCOSE, UA: NEGATIVE
HYALINE CAST: NONE SEEN /LPF
Ketones, ur: NEGATIVE
NITRITES URINE, INITIAL: NEGATIVE
PH: 6 (ref 5.0–8.0)
Protein, ur: NEGATIVE
RBC / HPF: NONE SEEN /HPF (ref 0–2)
Specific Gravity, Urine: 1.022 (ref 1.001–1.03)

## 2016-12-20 LAB — URINE CULTURE
MICRO NUMBER:: 81237765
SPECIMEN QUALITY:: ADEQUATE

## 2016-12-20 LAB — CULTURE INDICATED

## 2016-12-21 ENCOUNTER — Other Ambulatory Visit: Payer: Self-pay | Admitting: Gynecology

## 2016-12-21 MED ORDER — AMPICILLIN 500 MG PO CAPS: 500.0000 mg | ORAL_CAPSULE | Freq: Four times a day (QID) | ORAL | 0 refills | Status: DC

## 2017-01-13 ENCOUNTER — Encounter: Payer: Self-pay | Admitting: Gynecology

## 2017-01-13 ENCOUNTER — Other Ambulatory Visit: Payer: Self-pay | Admitting: Gynecology

## 2017-01-13 DIAGNOSIS — Z1231 Encounter for screening mammogram for malignant neoplasm of breast: Secondary | ICD-10-CM

## 2017-01-13 NOTE — Telephone Encounter (Signed)
Most of those values are at the very upper limits of normal.  The highest platelet count is considered 400,000 and the lymphocyte count 3900.  I think these both are well within acceptable ranges.  They are not in any danger zones.  I do not feel they need to be repeated but if the patient would be worrying about this then she can repeat a CBC with differential in several months.

## 2017-02-18 ENCOUNTER — Ambulatory Visit
Admission: RE | Admit: 2017-02-18 | Discharge: 2017-02-18 | Disposition: A | Discharge status: Home / Self Care | Payer: 59 | Source: Ambulatory Visit | Attending: Gynecology | Admitting: Gynecology

## 2017-02-18 DIAGNOSIS — Z1231 Encounter for screening mammogram for malignant neoplasm of breast: Secondary | ICD-10-CM

## 2017-12-30 ENCOUNTER — Ambulatory Visit (INDEPENDENT_AMBULATORY_CARE_PROVIDER_SITE_OTHER): Payer: 59 | Admitting: Gynecology

## 2017-12-30 ENCOUNTER — Encounter: Payer: Self-pay | Admitting: Gynecology

## 2017-12-30 VITALS — BP 124/74 | Ht 60.0 in | Wt 199.0 lb

## 2017-12-30 DIAGNOSIS — Z1151 Encounter for screening for human papillomavirus (HPV): Secondary | ICD-10-CM

## 2017-12-30 DIAGNOSIS — F419 Anxiety disorder, unspecified: Secondary | ICD-10-CM

## 2017-12-30 DIAGNOSIS — Z13220 Encounter for screening for lipoid disorders: Secondary | ICD-10-CM

## 2017-12-30 DIAGNOSIS — Z01419 Encounter for gynecological examination (general) (routine) without abnormal findings: Secondary | ICD-10-CM | POA: Diagnosis not present

## 2017-12-30 MED ORDER — FLUOXETINE HCL 20 MG PO CAPS: 20.0000 mg | ORAL_CAPSULE | Freq: Every day | ORAL | 11 refills | Status: DC

## 2017-12-30 NOTE — Patient Instructions (Signed)
Office will call you to arrange for the breast MRI.  Continue on yearly mammograms.  Start on the fluoxetine daily.  Call me in 4 weeks if you do not feel you are getting an adequate response.

## 2017-12-30 NOTE — Addendum Note (Signed)
Addended by: Nelva Nay on: 12/30/2017 12:26 PM   Modules accepted: Orders

## 2017-12-30 NOTE — Progress Notes (Signed)
    Angela Ramsey 07/28/1965 [id number]        52 y.o.  [id number] for annual gynecologic exam.  Doing well without gynecologic complaints.  Past medical history,surgical history, problem list, medications, allergies, family history and social history were all reviewed and documented as reviewed in the EPIC chart.  ROS:  Performed with pertinent positives and negatives included in the history, assessment and plan.   Additional significant findings : None   Exam: [name] assistant Vitals:   12/30/17 1050  BP: 124/74  Weight: 199 lb (90.3 kg)  Height: 5' (1.524 m)   Body mass index is 38.86 kg/m.  General appearance:  Normal affect, orientation and appearance. Skin: Grossly normal HEENT: Without gross lesions.  No cervical or supraclavicular adenopathy. Thyroid normal.  Lungs:  Clear without wheezing, rales or rhonchi Cardiac: RR, without RMG Abdominal:  Soft, nontender, without masses, guarding, rebound, organomegaly or hernia Breasts:  Examined lying and sitting without masses, retractions, discharge or axillary adenopathy. Pelvic:  Ext, BUS, Vagina: With atrophic changes  Cervix: With atrophic changes.  Pap smear/HPV  Uterus: Anteverted, normal size, shape and contour, midline and mobile nontender   Adnexa: Without masses or tenderness    Anus and perineum: Normal   Rectovaginal: Normal sphincter tone without palpated masses or tenderness.    Assessment/Plan:  52 y.o. G37P2002 female for annual gynecologic exam without menses, vasectomy birth control.   1. Perimenopausal.  Continues without menses status post ablation in the past.  No significant menopausal symptoms.  Report any vaginal bleeding or significant symptoms. 2. History of ATM mutation of unknown significance.  Calculated breast cancer risk 18 to 23.9%.  We discussed MRI screening in the past and she would like to go ahead and pursue this now.  Coming due for mammogram in January and I reminded her to schedule  this.  We will help her make arrangements for the breast MRI.  SBE monthly reviewed. 3. Pap smear/HPV 2015.  Pap smear/HPV today.  No history of significant abnormal Pap smears. 4. Anxiety.  Son-in-law committed suicide this past year.  Having a lot of anxiety and some depression with difficulty sleeping.  Is able to function daily.  Would like to try a daily antidepressant.  We discussed various options and ultimately decided on fluoxetine 20 mg daily.  Refill x1 year provided.  She will call me if she does not feel she is getting an adequate response after 1 month.  She knows to call ASAP if she starts to feel darker.  Check baseline TSH. 5. Health maintenance.  Requests baseline labs.  CBC, CMP, lipid profile and TSH.  Follow-up 1 year, sooner as needed.   [name] MD, 11:32 AM 12/30/2017

## 2017-12-31 ENCOUNTER — Other Ambulatory Visit: Payer: Self-pay | Admitting: Gynecology

## 2017-12-31 DIAGNOSIS — E7800 Pure hypercholesterolemia, unspecified: Secondary | ICD-10-CM

## 2017-12-31 LAB — LIPID PANEL
CHOL/HDL RATIO: 3.9 (calc) (ref <5.0)
Cholesterol: 221 mg/dL — ABNORMAL HIGH (ref <200)
HDL: 56 mg/dL (ref >50)
LDL CHOLESTEROL (CALC): 142 mg/dL — AB
Non-HDL Cholesterol (Calc): 165 mg/dL (calc) — ABNORMAL HIGH (ref <130)
TRIGLYCERIDES: 112 mg/dL (ref <150)

## 2017-12-31 LAB — CBC WITH DIFFERENTIAL/PLATELET
Basophils Absolute: 35 cells/uL (ref 0–200)
Basophils Relative: 0.3 %
Eosinophils Absolute: 94 cells/uL (ref 15–500)
Eosinophils Relative: 0.8 %
HEMATOCRIT: 41.4 % (ref 35.0–45.0)
Hemoglobin: 13.8 g/dL (ref 11.7–15.5)
LYMPHS ABS: 3375 {cells}/uL (ref 850–3900)
MCH: 28.9 pg (ref 27.0–33.0)
MCHC: 33.3 g/dL (ref 32.0–36.0)
MCV: 86.8 fL (ref 80.0–100.0)
MPV: 9.9 fL (ref 7.5–12.5)
Monocytes Relative: 4.7 %
NEUTROS PCT: 65.6 %
Neutro Abs: 7741 cells/uL (ref 1500–7800)
PLATELETS: 389 10*3/uL (ref 140–400)
RBC: 4.77 10*6/uL (ref 3.80–5.10)
RDW: 12.6 % (ref 11.0–15.0)
TOTAL LYMPHOCYTE: 28.6 %
WBC mixed population: 555 cells/uL (ref 200–950)
WBC: 11.8 10*3/uL — AB (ref 3.8–10.8)

## 2017-12-31 LAB — COMPREHENSIVE METABOLIC PANEL
AG Ratio: 1.3 (calc) (ref 1.0–2.5)
ALBUMIN MSPROF: 4.1 g/dL (ref 3.6–5.1)
ALT: 9 U/L (ref 6–29)
AST: 13 U/L (ref 10–35)
Alkaline phosphatase (APISO): 91 U/L (ref 33–130)
BUN: 13 mg/dL (ref 7–25)
CO2: 25 mmol/L (ref 20–32)
CREATININE: 0.79 mg/dL (ref 0.50–1.05)
Calcium: 9.3 mg/dL (ref 8.6–10.4)
Chloride: 103 mmol/L (ref 98–110)
GLUCOSE: 85 mg/dL (ref 65–99)
Globulin: 3.1 g/dL (calc) (ref 1.9–3.7)
POTASSIUM: 4 mmol/L (ref 3.5–5.3)
SODIUM: 139 mmol/L (ref 135–146)
Total Bilirubin: 0.6 mg/dL (ref 0.2–1.2)
Total Protein: 7.2 g/dL (ref 6.1–8.1)

## 2017-12-31 LAB — TSH: TSH: 2.39 mIU/L

## 2018-01-03 ENCOUNTER — Telehealth: Payer: Self-pay | Admitting: *Deleted

## 2018-01-03 ENCOUNTER — Encounter: Payer: Self-pay | Admitting: *Deleted

## 2018-01-03 DIAGNOSIS — Z803 Family history of malignant neoplasm of breast: Secondary | ICD-10-CM

## 2018-01-03 LAB — PAP IG AND HPV HIGH-RISK: HPV DNA HIGH RISK: NOT DETECTED

## 2018-01-03 NOTE — Telephone Encounter (Signed)
Order placed at [facility] sent patient my chart message to schedule.

## 2018-01-03 NOTE — Telephone Encounter (Signed)
-----   Message from Angela Auerbach, MD sent at 12/30/2017 11:30 AM EST ----- Help patient arrange for breast MRI reference genetic mutation of unknown significance, strong family history of breast cancer and calculated 10-year breast cancer risk of 23.9%

## 2018-01-03 NOTE — Telephone Encounter (Signed)
Mri scheduled on 01/20/18 @ 1:10pm

## 2018-01-05 ENCOUNTER — Telehealth: Payer: Self-pay

## 2018-01-05 NOTE — Telephone Encounter (Signed)
I called ins co and spoke with Alexa regarding PA for Breast MRI 61224.  She said it was a valid and billable code but no PA is required. A manual reviews will be done at the time the claim is received to records just be sent with claim.  Note added to appointment notes for MRI appt.

## 2018-01-20 ENCOUNTER — Ambulatory Visit
Admission: RE | Admit: 2018-01-20 | Discharge: 2018-01-20 | Disposition: A | Discharge status: Home / Self Care | Payer: 59 | Source: Ambulatory Visit | Attending: Gynecology | Admitting: Gynecology

## 2018-01-20 ENCOUNTER — Encounter: Payer: Self-pay | Admitting: Gynecology

## 2018-01-20 DIAGNOSIS — N6489 Other specified disorders of breast: Secondary | ICD-10-CM | POA: Diagnosis not present

## 2018-01-20 DIAGNOSIS — Z803 Family history of malignant neoplasm of breast: Secondary | ICD-10-CM

## 2018-01-20 MED ORDER — GADOBUTROL 1 MMOL/ML IV SOLN
8.0000 mL | Freq: Once | INTRAVENOUS | Status: AC | PRN
  Administered 2018-01-20: 8 mL via INTRAVENOUS

## 2018-03-04 ENCOUNTER — Other Ambulatory Visit: Payer: Self-pay | Admitting: Gynecology

## 2018-03-04 DIAGNOSIS — Z1231 Encounter for screening mammogram for malignant neoplasm of breast: Secondary | ICD-10-CM

## 2018-03-21 ENCOUNTER — Ambulatory Visit
Admission: RE | Admit: 2018-03-21 | Discharge: 2018-03-21 | Disposition: A | Discharge status: Home / Self Care | Payer: 59 | Source: Ambulatory Visit | Attending: Gynecology | Admitting: Gynecology

## 2018-03-21 DIAGNOSIS — Z1231 Encounter for screening mammogram for malignant neoplasm of breast: Secondary | ICD-10-CM | POA: Diagnosis not present

## 2018-04-26 ENCOUNTER — Ambulatory Visit (INDEPENDENT_AMBULATORY_CARE_PROVIDER_SITE_OTHER): Payer: 59 | Admitting: Family Medicine

## 2018-04-26 ENCOUNTER — Encounter: Payer: Self-pay | Admitting: Family Medicine

## 2018-04-26 VITALS — BP 136/90 | HR 73 | Temp 98.4°F | Ht 60.0 in | Wt 197.7 lb

## 2018-04-26 DIAGNOSIS — R1012 Left upper quadrant pain: Secondary | ICD-10-CM | POA: Diagnosis not present

## 2018-04-26 DIAGNOSIS — M549 Dorsalgia, unspecified: Secondary | ICD-10-CM | POA: Diagnosis not present

## 2018-04-26 DIAGNOSIS — Z1589 Genetic susceptibility to other disease: Secondary | ICD-10-CM | POA: Diagnosis not present

## 2018-04-26 DIAGNOSIS — R100 Acute abdomen: Secondary | ICD-10-CM

## 2018-04-26 DIAGNOSIS — Z803 Family history of malignant neoplasm of breast: Secondary | ICD-10-CM

## 2018-04-26 DIAGNOSIS — R109 Unspecified abdominal pain: Secondary | ICD-10-CM

## 2018-04-26 DIAGNOSIS — R350 Frequency of micturition: Secondary | ICD-10-CM | POA: Diagnosis not present

## 2018-04-26 LAB — POCT URINALYSIS DIPSTICK
Bilirubin, UA: NEGATIVE
Glucose, UA: NEGATIVE
KETONES UA: NEGATIVE
NITRITE UA: NEGATIVE
PROTEIN UA: NEGATIVE
Spec Grav, UA: 1.025 (ref 1.010–1.025)
Urobilinogen, UA: 0.2 E.U./dL
pH, UA: 5.5 (ref 5.0–8.0)

## 2018-04-26 NOTE — Progress Notes (Signed)
Impression and Recommendations:    1. Acute back pain, unspecified back location, unspecified back pain laterality   2. Abdominal pain, unspecified abdominal location- most prominent L upper quad   3. Left upper quadrant pain   4. mutation of ATM gene   5. Strong Family history of breast cancer in females   6. Acute abdomen    - Without further workup and treatment planned, risk to pt's morbidity is high with possible prolonged functional impairment and poor health outcomes.   1. Acute Abdominal Pain w/Guarding on Exam - Blood work ordered today.   - In addition, patient is fasting.  - CT scan of abdomen indicated today given patient's symptoms.  - Patient agrees to return in two weeks for re-check. - Advised patient that her tenderness is concerning.  - Advised patient to keep a journal of foods and activities that worsen her pain.   - Journal when the pain is worse, with what meals she has consumed, etc.  - Patient understands critical need to be compliant with follow-up.  - Colonoscopy and EGD ordered today.  - Discussed signs of pain becoming acutely worse. - Discussed red flag symptoms and need to go to the hospital if symptoms acutely worsen.  - Per patient, states family is high-risk for certain cancers, especially breast, and she had a gene test for BRCA and ATM. Per patient, family has ATM gene mutation, VUS (variant of uncertain significance).    Reviewed Patient's CA Family History in Office, per patient: Maternal grandmother with breast cancer. Maternal aunt with breast cancer twice. Mom's twin sister with breast cancer. Mom had hysterectomy at age 28 and "several knots taken out of her breasts." Patient has had a breast biopsy with "hyperplasia and calcium." In December, patient had a breast MRI with OBGYN Dr. Phineas Real. Patient states she had moderate background enhancement on her breast MRI. Denies family history of colon, pancreatic, gallbladder,  stomach, or esophageal cancer.  - Strongly encouraged patient to exercise, eat more healthfully, and lose weight to help reduce her risk of disease.  Discussed that female cancers are linked to obesity, and it would benefit patient to reduce her body habitus below the obese range.    Education and routine counseling performed. Handouts provided.   Pt was interviewed and evaluated by me in the clinic today for 32.5+ minutes, with over 50% time spent in face to face counseling of patients various medical conditions, treatment plans of those medical conditions including medicine management and lifestyle modification, strategies to improve health and well being; and in coordination of care. SEE ABOVE TREATMENT PLAN FOR DETAILS   Orders Placed This Encounter  Procedures  . Urine Culture  . CT Abdomen Pelvis Wo Contrast  . Lipase  . CBC with Differential/Platelet  . Comprehensive metabolic panel  . Lipid panel  . TSH  . T4, free  . Gamma GT  . Bilirubin, fractionated(tot/dir/indir)  . Amylase  . Ambulatory referral to Gastroenterology  . POCT urinalysis dipstick    Medications Discontinued During This Encounter  Medication Reason  . ALPRAZolam (XANAX) 0.5 MG tablet Discontinued by provider     No orders of the defined types were placed in this encounter.   The patient was counseled, risk factors were discussed, anticipatory guidance given.  Gross side effects, risk and benefits, and alternatives of medications discussed with patient.  Patient is aware that all medications have potential side effects and we are unable to predict every  side effect or drug-drug interaction that may occur.  Expresses verbal understanding and consents to current therapy plan and treatment regimen.   Return for 2) f/up in 2-3 wks.  This document serves as a record of services personally performed by Mellody Dance, DO. It was created on her behalf by Toni Amend, a trained medical scribe. The  creation of this record is based on the scribe's personal observations and the provider's statements to them.   I have reviewed the above medical documentation for accuracy and completeness and I concur.  Mellody Dance, DO 04/26/2018 7:57 PM         Subjective:    HPI: Angela Ramsey is a 53 y.o. female who presents to [place] at [facility] today for c/o abdominal pain for the past two and a half weeks.  Symptoms for about two and a half week.  Drinks alcohol once a week, or once every other week.  Notes she's always stressed out.  States she is high risk for breast cancer due to ATM gene mutation. Her family is high-risk and she had to have a gene test for BRCA and ATM.  Maternal grandmother with breast cancer. Maternal aunt with breast cancer twice. Mom's twin sister with breast cancer. Mom had hysterectomy at age 47 and "several knots taken out of her breasts." Patient has had a breast biopsy with "hyperplasia and calcium." In December, patient had a breast MRI with OBGYN Dr. Phineas Real. Patient states she had moderate background enhancement on her breast MRI. Denies family history of colon, pancreatic, gallbladder, stomach, or esophageal cancer.  Complains of: States she's been hurting in her upper abdomen and back, and experiencing bloating.  Sometimes this bloating resolves, but returns.  States the pain "really hurts at night," and it doesn't matter if she's sleeping on her back, her stomach, or her side; the pain never changes.  Notes "I always have back pain, but this is different back pain; not my normal back pain."  The pain radiates from the central upper abdomen into the back.  She feels the pain also extends to her lower back.  Describes pain in her stomach and her back.  At first she thought it was a kidney infection or bladder infection.  The pain seems to be worse at night, because she feels the pain all night long when she tries to sleep.   The pain does wake her up out of bed.  Does not describe the pain as "always sharp."  States it is more a constant ache, and at times sharp.  Notes "it will go away and I can do things, but then it will start again."  Says she's tried just drinking water, tried doing things to take better care of it herself.  Notes "I'm going to the bathroom better, twice a day, and it's soft and not hard."  However, she is not drinking as much water as she should.   Her bowels have become soft for the past 2.5 weeks, along with the onset of symptoms.  Has not had a period in a couple of years.  Isn't sure if she is going through menopause.  Does state that sometimes the dull pain "feels like it did when I was having periods."  Notes sometimes the bloated, achy feeling reminds her of menstruating.  Now the pain is "more."  States "I feel it every day and it's not going away."  Had NovaSure operation in the past, and a laprascopy through  OBGYN.    Notes she still has all of her organs.  Denies: Denies fevers, nausea, vomiting.   Denies burning during urination.  No change before or after eating.    Urinalysis    Component Value Date/Time   COLORURINE YELLOW 12/18/2016 1057   APPEARANCEUR CLOUDY (A) 12/18/2016 1057   LABSPEC 1.022 12/18/2016 1057   PHURINE 6.0 12/18/2016 1057   GLUCOSEU NEGATIVE 12/18/2016 1057   HGBUR TRACE (A) 12/18/2016 1057   BILIRUBINUR negative 04/26/2018 1017   KETONESUR NEGATIVE 12/18/2016 1057   PROTEINUR Negative 04/26/2018 1017   PROTEINUR NEGATIVE 12/18/2016 1057   UROBILINOGEN 0.2 04/26/2018 1017   UROBILINOGEN 0.2 08/15/2014 1015   NITRITE negative 04/26/2018 1017   NITRITE NEGATIVE 10/30/2015 1228   LEUKOCYTESUR Small (1+) (A) 04/26/2018 1017    Wt Readings from Last 3 Encounters:  04/26/18 197 lb 11.2 oz (89.7 kg)  12/30/17 199 lb (90.3 kg)  12/18/16 201 lb (91.2 kg)   BP Readings from Last 3 Encounters:  04/26/18 136/90  12/30/17 124/74  12/18/16  124/82   Pulse Readings from Last 3 Encounters:  04/26/18 73  02/24/16 76  12/30/15 69   BMI Readings from Last 3 Encounters:  04/26/18 38.61 kg/m  12/30/17 38.86 kg/m  12/18/16 39.26 kg/m     Patient Active Problem List   Diagnosis Date Noted  . Genital herpes 02/24/2016    Priority: High  . BMI 37.0-37.9, adult 10/16/2015    Priority: High  . GAD (generalized anxiety disorder) 10/16/2015    Priority: High  . Nonspecific chest pain 10/16/2015    Priority: High  . 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 Strong Family history of breast cancer in females 04/26/2018  . Hemorrhoids- small external nonthrombosed hemorrhoid perirectal area at 10 PM 02/24/2016  . External hemorrhoids without complication 82/50/5397  . Screening for multiple conditions 12/31/2015  . Leukocytosis 12/31/2015  . Environmental and seasonal allergies 12/01/2015  . Family history of early CAD 10/16/2015  . Genetic testing 09/21/2014  . Family history of breast cancer   . Family history of thyroid cancer   . h/o ovarian Endometriosis     Past Surgical History:  Procedure Laterality Date  . BREAST BIOPSY Left 2016  . BREAST SURGERY     Breast Bx benign  . FOOT SURGERY    . NOVASURE ABLATION  10/2004  . PELVIC LAPAROSCOPY  12/2002   endometriosis    Family History  Problem Relation Age of Onset  . Hypertension Father   . Hyperlipidemia Father   . Stroke Father   . Breast cancer Maternal Aunt 67       mother's identical twin  . Cancer Maternal Aunt        breast  . Cancer Maternal Grandfather        lung  . Diabetes Paternal Grandmother   . Breast cancer Maternal Grandmother        dx in her late 22s to early 46s  . Cancer Maternal Grandmother        breast  . Breast cancer Maternal Aunt 49       second cancer at  19  . Cancer Maternal Aunt        breast x 2  . Leukemia Paternal Grandfather   . Cancer Paternal Grandfather        leukemia  . Hypertension Mother   . Thyroid cancer  Sister        dx in her early to mid 68s  .  Cancer Sister        thyroid  . Breast cancer Paternal Aunt 86    Social History   Substance and Sexual Activity  Drug Use No  ,  Social History   Substance and Sexual Activity  Alcohol Use Yes  . Alcohol/week: 1.0 standard drinks  . Types: 1 Glasses of wine per week  ,  Social History   Tobacco Use  Smoking Status Never Smoker  Smokeless Tobacco Never Used  ,  Social History   Substance and Sexual Activity  Sexual Activity Yes  . Birth control/protection: Surgical   Comment: 1st intercourse 20 yo-5 partners-Vasectomy    Patient's Medications  New Prescriptions   No medications on file  Previous Medications   FLUOXETINE (PROZAC) 20 MG CAPSULE    Take 1 capsule (20 mg total) by mouth daily.  Modified Medications   No medications on file  Discontinued Medications   ALPRAZOLAM (XANAX) 0.5 MG TABLET    Take 1 tablet (0.5 mg total) by mouth at bedtime as needed.    Patient has no known allergies.  Current Meds  Medication Sig  . FLUoxetine (PROZAC) 20 MG capsule Take 1 capsule (20 mg total) by mouth daily.    Review of Systems: General:   No F/C, wt loss Pulm:   No DIB, pleuritic chest pain Card:  No CP, palpitations Abd:  No n/v/d or pain GU:  Dysuria, increased frequency and urgency; no vaginal discharge Ext:  No inc edema from baseline   Objective:  Blood pressure 136/90, pulse 73, temperature 98.4 F (36.9 C), height 5' (1.524 m), weight 197 lb 11.2 oz (89.7 kg), last menstrual period 12/25/2015, SpO2 99 %. Body mass index is 38.61 kg/m.  General: Well Developed, well nourished, and in no acute distress.  HEENT: Normocephalic, atraumatic Skin: Warm and dry, cap RF less 2 sec, good turgor CV: +S1, S2 Respiratory: ECTA B/L; speaking in full sentences, no conversational dyspnea Abd:   Non-Dist;  Bowel sounds present but hypoactive.  Guarding/ wincing with rebound tenderness to deeper palpation in the central  < L upper abdomen;  no SPT, No flank pain NeuroM-Sk: Ambulates w/o assistance, moves * 4 Psych: A and O *3     

## 2018-04-26 NOTE — Patient Instructions (Signed)
Keep pain journal bring in next ov

## 2018-04-27 LAB — CBC WITH DIFFERENTIAL/PLATELET
Basophils Absolute: 0.1 10*3/uL (ref 0.0–0.2)
Basos: 1 %
EOS (ABSOLUTE): 0.1 10*3/uL (ref 0.0–0.4)
Eos: 1 %
HEMOGLOBIN: 14 g/dL (ref 11.1–15.9)
Hematocrit: 41.9 % (ref 34.0–46.6)
IMMATURE GRANS (ABS): 0 10*3/uL (ref 0.0–0.1)
Immature Granulocytes: 0 %
LYMPHS: 33 %
Lymphocytes Absolute: 3.2 10*3/uL — ABNORMAL HIGH (ref 0.7–3.1)
MCH: 29 pg (ref 26.6–33.0)
MCHC: 33.4 g/dL (ref 31.5–35.7)
MCV: 87 fL (ref 79–97)
MONOCYTES: 5 %
Monocytes Absolute: 0.5 10*3/uL (ref 0.1–0.9)
NEUTROS ABS: 5.9 10*3/uL (ref 1.4–7.0)
Neutrophils: 60 %
Platelets: 323 10*3/uL (ref 150–450)
RBC: 4.82 x10E6/uL (ref 3.77–5.28)
RDW: 12.4 % (ref 11.7–15.4)
WBC: 9.8 10*3/uL (ref 3.4–10.8)

## 2018-04-27 LAB — COMPREHENSIVE METABOLIC PANEL
A/G RATIO: 1.5 (ref 1.2–2.2)
ALBUMIN: 4.3 g/dL (ref 3.8–4.9)
ALT: 14 IU/L (ref 0–32)
AST: 12 IU/L (ref 0–40)
Alkaline Phosphatase: 125 IU/L — ABNORMAL HIGH (ref 39–117)
BILIRUBIN TOTAL: 0.5 mg/dL (ref 0.0–1.2)
BUN / CREAT RATIO: 20 (ref 9–23)
BUN: 15 mg/dL (ref 6–24)
CHLORIDE: 97 mmol/L (ref 96–106)
CO2: 27 mmol/L (ref 20–29)
Calcium: 9.6 mg/dL (ref 8.7–10.2)
Creatinine, Ser: 0.75 mg/dL (ref 0.57–1.00)
GFR, EST AFRICAN AMERICAN: 107 mL/min/{1.73_m2} (ref >59)
GFR, EST NON AFRICAN AMERICAN: 93 mL/min/{1.73_m2} (ref >59)
GLUCOSE: 83 mg/dL (ref 65–99)
Globulin, Total: 2.9 g/dL (ref 1.5–4.5)
Potassium: 4.2 mmol/L (ref 3.5–5.2)
Sodium: 137 mmol/L (ref 134–144)
TOTAL PROTEIN: 7.2 g/dL (ref 6.0–8.5)

## 2018-04-27 LAB — LIPID PANEL
CHOL/HDL RATIO: 3.7 ratio (ref 0.0–4.4)
Cholesterol, Total: 224 mg/dL — ABNORMAL HIGH (ref 100–199)
HDL: 60 mg/dL (ref >39)
LDL Calculated: 148 mg/dL — ABNORMAL HIGH (ref 0–99)
Triglycerides: 81 mg/dL (ref 0–149)
VLDL CHOLESTEROL CAL: 16 mg/dL (ref 5–40)

## 2018-04-27 LAB — GAMMA GT: GGT: 12 IU/L (ref 0–60)

## 2018-04-27 LAB — TSH: TSH: 3.15 u[IU]/mL (ref 0.450–4.500)

## 2018-04-27 LAB — T4, FREE: FREE T4: 1.18 ng/dL (ref 0.82–1.77)

## 2018-04-27 LAB — AMYLASE: Amylase: 39 U/L (ref 31–110)

## 2018-04-27 LAB — LIPASE: Lipase: 22 U/L (ref 14–72)

## 2018-04-28 LAB — URINE CULTURE

## 2018-05-01 ENCOUNTER — Encounter: Payer: Self-pay | Admitting: Family Medicine

## 2018-05-02 ENCOUNTER — Other Ambulatory Visit: Payer: Self-pay

## 2018-05-02 ENCOUNTER — Ambulatory Visit
Admission: RE | Admit: 2018-05-02 | Discharge: 2018-05-02 | Disposition: A | Discharge status: Home / Self Care | Payer: 59 | Source: Ambulatory Visit | Attending: Family Medicine | Admitting: Family Medicine

## 2018-05-02 DIAGNOSIS — Z1589 Genetic susceptibility to other disease: Secondary | ICD-10-CM

## 2018-05-02 DIAGNOSIS — K5732 Diverticulitis of large intestine without perforation or abscess without bleeding: Secondary | ICD-10-CM | POA: Diagnosis not present

## 2018-05-02 DIAGNOSIS — R109 Unspecified abdominal pain: Secondary | ICD-10-CM

## 2018-05-02 DIAGNOSIS — Z803 Family history of malignant neoplasm of breast: Secondary | ICD-10-CM

## 2018-05-02 DIAGNOSIS — R100 Acute abdomen: Secondary | ICD-10-CM

## 2018-05-02 DIAGNOSIS — K439 Ventral hernia without obstruction or gangrene: Secondary | ICD-10-CM | POA: Diagnosis not present

## 2018-05-02 DIAGNOSIS — M549 Dorsalgia, unspecified: Secondary | ICD-10-CM

## 2018-05-02 DIAGNOSIS — R1012 Left upper quadrant pain: Secondary | ICD-10-CM

## 2018-05-05 ENCOUNTER — Telehealth: Payer: Self-pay | Admitting: Family Medicine

## 2018-05-05 NOTE — Telephone Encounter (Signed)
Sent message to [place]. MPulliam, CMA/RT(R)

## 2018-05-05 NOTE — Telephone Encounter (Signed)
Patient called states she had a Ct Scan on Monday, 05/02/18 and was told results should be given in 3 dys--- Advised pt provider out of office time Tue 3/24 & forwarding her request to medical assistant for review w/ Covering provider for result release.  --glh

## 2018-05-09 ENCOUNTER — Telehealth (INDEPENDENT_AMBULATORY_CARE_PROVIDER_SITE_OTHER): Payer: 59 | Admitting: Gastroenterology

## 2018-05-09 ENCOUNTER — Other Ambulatory Visit: Payer: Self-pay

## 2018-05-09 DIAGNOSIS — K5900 Constipation, unspecified: Secondary | ICD-10-CM | POA: Diagnosis not present

## 2018-05-09 DIAGNOSIS — R1030 Lower abdominal pain, unspecified: Secondary | ICD-10-CM

## 2018-05-09 DIAGNOSIS — Z1211 Encounter for screening for malignant neoplasm of colon: Secondary | ICD-10-CM | POA: Diagnosis not present

## 2018-05-09 NOTE — Progress Notes (Signed)
Virtual Visit via Telephone Note  I connected with Angela Ramsey on 05/09/18 at  9:30 AM EDT by telephone and verified that I am speaking with the correct person using two identifiers.   I discussed the limitations, risks, security and privacy concerns of performing an evaluation and management service by telephone and the availability of in person appointments. I also discussed with the patient that there may be a patient responsible charge related to this service. The patient expressed understanding and agreed to proceed.  THIS ENCOUNTER IS A VIRTUAL VISIT DUE TO COVID-19 - PATIENT WAS NOT SEEN IN THE OFFICE. PATIENT HAS CONSENTED TO VIRTUAL VISIT / TELEMEDICINE VISIT   Location of patient: home Location of provider: office Name of referring provider: Mellody Dance DO  Time spent on call:  20 minutes   HPI :  53 year old female with a history of endometriosis and ATM gene mutation, referred by Angela Ramsey for abdominal pain and colonoscopy.  The patient reports she's been having cramping pain in her lower abdomen for the past month or so. Pain localizes in the lower mid abdomen, below the umbilicus. She states it feels like a menses cramp however she has not had a menstrual cycle for a few years. She endorses adult cramp present all the time, this can often worsen at night. There is some positional component when she lies on her stomach is not as bad. Baseline discomfort is related for a time, can increase to 6 out of 10 at times. She denies any prandial association. She denies any trauma to the abdomen or any recent lifting or unusual activities. She does endorse having some constipation which is rather new for her, can go up to a few days without a bowel movement. Having a bowel movement does not provide any relief in her discomfort. She denies any blood in her stools. She has had some blood on the toilet paper with wiping at times attributes to hemorrhoids which she's had for years.  She is eating okay. No nausea or vomiting. No reflux symptoms. No dysphagia. She denies any routine use of NSAIDs. She does have chronic low back pain which bothers her, she denies any recent worsening. She has not tried any medications for this. She does not use tobacco. She drinks a few alcoholic beverages a week. She denies any prior colonoscopy. No weight loss. She reports during visit with Angela Ramsey she had upper abdominal tenderness to palpation but denies that at baseline, endorses only lower abdominal pain.  As have a strong family history of breast cancer, she has a positive ATM mutation. No FH of colon cancer. She had a CT scan of her abdomen and pelvis done on 3/17 - some sigmoid diverticulosis without diverticulitis. No bowel thickening. Minimal ventral hernia containing only fat. No other acute pathology. Arthropathy in the SI joint noted.    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   ATM gene called [id number]  mutation of unknown significance see genetic counseling note 09/21/2014     Past Surgical History:  Procedure Laterality Date   BREAST BIOPSY Left 2016   BREAST SURGERY     Breast Bx benign   FOOT SURGERY     NOVASURE ABLATION  10/2004   PELVIC LAPAROSCOPY  12/2002   endometriosis   Family History  Problem Relation Age of Onset   Hypertension Father    Hyperlipidemia Father    Stroke Father  Breast cancer Maternal Aunt 45       mother's identical twin   Cancer Maternal Aunt        breast   Cancer Maternal Grandfather        lung   Diabetes Paternal Grandmother    Breast cancer Maternal Grandmother        dx in her late 64s to early 21s   Cancer Maternal Grandmother        breast   Breast cancer Maternal Aunt 64       second cancer at  39   Cancer Maternal Aunt        breast x 2   Leukemia Paternal Grandfather    Cancer Paternal Grandfather        leukemia    Hypertension Mother    Thyroid cancer Sister        dx in her early to mid 74s   Cancer Sister        thyroid   Breast cancer Paternal Aunt 86   Social History   Tobacco Use   Smoking status: Never Smoker   Smokeless tobacco: Never Used  Substance Use Topics   Alcohol use: Yes    Alcohol/week: 1.0 standard drinks    Types: 1 Glasses of wine per week   Drug use: No   Current Outpatient Medications  Medication Sig Dispense Refill   FLUoxetine (PROZAC) 20 MG capsule Take 1 capsule (20 mg total) by mouth daily. 30 capsule 11   No current facility-administered medications for this visit.    No Known Allergies   Review of Systems: All systems reviewed and negative except where noted in HPI.    Ct Abdomen Pelvis Wo Contrast  Result Date: 05/03/2018 CLINICAL DATA:  Upper abdominal pain, primarily left-sided EXAM: CT ABDOMEN AND PELVIS WITHOUT CONTRAST TECHNIQUE: Multidetector CT imaging of the abdomen and pelvis was performed following the standard protocol without IV contrast. Oral contrast was administered. COMPARISON:  None. FINDINGS: Lower chest: Lung bases are clear. Hepatobiliary: No focal liver lesions are appreciable on this noncontrast enhanced study. The gallbladder wall is not appreciably thickened. There is no biliary duct dilatation. Pancreas: No pancreatic mass or inflammatory focus evident. Spleen: No splenic lesions are appreciable. Adrenals/Urinary Tract: Adrenals bilaterally appear normal. Kidneys bilaterally show no evident mass or hydronephrosis on either side. There is no demonstrable renal or ureteral calculus on either side. Urinary bladder is midline with wall thickness within normal limits for degree of distention. Stomach/Bowel: There are scattered sigmoid diverticula without diverticulitis. No bowel wall or mesenteric thickening evident. There is no bowel obstruction. No free air or portal venous air. There is mild lipomatous infiltration of the ileocecal  valve. Terminal ileum appears normal. Vascular/Lymphatic: There is no abdominal aortic aneurysm. There is minimal calcification in the aorta. No adenopathy is appreciable in the abdomen or pelvis. Reproductive: Uterus is anteverted. There is no evident pelvic mass. Other: There is no periappendiceal region inflammation. There is no abscess or ascites in the abdomen or pelvis. There is a rather minimal ventral hernia containing only fat. Musculoskeletal: There is mild osteitis condensans ilia bilaterally. There is vacuum phenomenon in each sacroiliac joint. No blastic or lytic bone lesions. No intramuscular or abdominal wall lesion evident. IMPRESSION: 1. Occasional sigmoid diverticula without diverticulitis. There is no appreciable bowel wall thickening or bowel obstruction. No evident abscess in the abdomen or pelvis. No periappendiceal region inflammatory change. 2.  No evident renal or ureteral calculus.  No hydronephrosis. 3.  Arthropathy in each sacroiliac joint.  No frank sacroiliitis. 4.  Rather minimal ventral hernia containing only fat. Electronically Signed   By: Lowella Grip III M.D.   On: 05/03/2018 08:18   Lab Results  Component Value Date   WBC 9.8 04/26/2018   HGB 14.0 04/26/2018   HCT 41.9 04/26/2018   MCV 87 04/26/2018   PLT 323 04/26/2018    Lab Results  Component Value Date   CREATININE 0.75 04/26/2018   BUN 15 04/26/2018   NA 137 04/26/2018   K 4.2 04/26/2018   CL 97 04/26/2018   CO2 27 04/26/2018    Lab Results  Component Value Date   ALT 14 04/26/2018   AST 12 04/26/2018   GGT 12 04/26/2018   ALKPHOS 125 (H) 04/26/2018   BILITOT 0.5 04/26/2018    ASSESSMENT AND PLAN:  53 year old female here for new patient consultation regarding the following:  Abdominal pain / Colon cancer screening - 1 month of persistent lower abdominal pain as described above. CT scan without any clear pathology. Her labs are relatively unremarkable, alkaline phosphatase at upper  limit of normal with a normal GGT. I reassured her that imaging and labs looked good so far. She is due for a routine screening colonoscopy in light of her lower abdominal pain I think it's a good idea to proceed with that. I discussed with colonoscopy and anesthesia in detail and she wanted to proceed. The question is the timing of this given the COVID-19 outbreak, our endoscopy center will no longer be doing elective procedures as it stands right now although that could change depending on the situation. She will be contacted with further instruction in the upcoming days about this. Otherwise, discussed ddx for her pain. It's possible this could be musculoskeletal otherwise. I think a trial of some tylenol is reasonable to see if that helps, and then would also use Miralax to treat constipation and see if that helps. She denies any upper symptoms although reportedly had tenderness in the upper abdomen. If she develops upper abdominal pain would try empiric PPI and consider adding EGD as well.   She agreed with the plan, we will put her on list to schedule endoscopies once our center is cleared to do so.   Samburg Cellar, MD [facility]  CC: Angela Dance, DO

## 2018-05-17 ENCOUNTER — Ambulatory Visit (INDEPENDENT_AMBULATORY_CARE_PROVIDER_SITE_OTHER): Payer: 59 | Admitting: Family Medicine

## 2018-05-17 ENCOUNTER — Other Ambulatory Visit: Payer: Self-pay

## 2018-05-17 DIAGNOSIS — Z5329 Procedure and treatment not carried out because of patient's decision for other reasons: Secondary | ICD-10-CM

## 2018-05-17 NOTE — Progress Notes (Signed)
      Virtual Visit via Telephone Note for [employer], D.O- Primary Care Physician at [facility] patient at 3:17 PM in the afternoon for her 3:15 appt.  Again at 3:20 and Called pt again at 3:45 pm since I did not have a pt- thought she might be available.    She did not answer at the phone no specified.    I left her a voice message on [phone] twice    Mellody Dance, DO

## 2018-05-18 ENCOUNTER — Ambulatory Visit (INDEPENDENT_AMBULATORY_CARE_PROVIDER_SITE_OTHER): Payer: 59 | Admitting: Family Medicine

## 2018-05-18 ENCOUNTER — Encounter: Payer: Self-pay | Admitting: Family Medicine

## 2018-05-18 VITALS — BP 122/76 | Temp 96.6°F | Ht 60.0 in | Wt 194.0 lb

## 2018-05-18 DIAGNOSIS — N959 Unspecified menopausal and perimenopausal disorder: Secondary | ICD-10-CM | POA: Diagnosis not present

## 2018-05-18 DIAGNOSIS — E785 Hyperlipidemia, unspecified: Secondary | ICD-10-CM

## 2018-05-18 DIAGNOSIS — R109 Unspecified abdominal pain: Secondary | ICD-10-CM | POA: Diagnosis not present

## 2018-05-18 DIAGNOSIS — R1012 Left upper quadrant pain: Secondary | ICD-10-CM

## 2018-05-18 NOTE — Progress Notes (Signed)
Virtual Visit via Telephone Note for [employer], D.O- Primary Care Physician at [facility]   I connected with current patient today by telephone and verified that I am speaking with the correct person using two identifiers.   I discussed the limitations, risks, security and privacy concerns of performing an evaluation and management service by telephone and the limited availability of in person appointments during this current national crisis of the Covid-19 pandemic.  My staff members also discussed with the patient that there may be a patient responsible charge related to this service.  The patient expressed understanding and agreed to proceed.     History of Present Illness:   Patient was last seen 04/26/2018. At that time she had acute abdominal pain with guarding.    Below is the HPI from that OV:   "States she's been hurting in her upper abdomen and back, and experiencing bloating. Sometimes this bloating resolves, but returns. States the pain "really hurts at night," and it doesn't matter if she's sleeping on her back, her stomach, or her side; the pain never changes.    Notes "I always have back pain, but this is different back pain; not my normal back pain." The pain radiates from the central upper abdomen into the back. She feels the pain also extends to her lower back.    Describes pain in her stomach and her back. At first she thought it was a kidney infection or bladder infection. The pain seems to be worse at night, because she feels the pain all night long when she tries to sleep. The pain does wake her up out of bed.    Does not describe the pain as "always sharp." States it is more a constant ache, and at times sharp. Notes "it will go away and I can do things, but then it will start again." Says she's tried just drinking water, tried doing things to take better care of it herself.    Notes "I'm going to the bathroom better, twice a day, and  it's soft and not hard." However, she is not drinking as much water as she should.  Her bowels have become soft for the past 2.5 weeks, along with the onset of symptoms.   Has not had a period in a couple of years. Isn't sure if she is going through menopause.    Does state that sometimes the dull pain "feels like it did when I was having periods." Notes sometimes the bloated, achy feeling reminds her of menstruating. Now the pain is "more." States "I feel it every day and it's not going away."    Had NovaSure operation in the past, and a laprascopy through OBGYN.     Notes she still has all of her organs.    Denies:  Denies fevers, nausea, vomiting.    Denies burning during urination.    No change before or after eating."    She was sent for a CT abdomen pelvis which did not show any acute findings. She was told to follow-up in 2 to 3 weeks with [place] to see how her symptoms are doing.  That is the purpose of today's OV, she was asked to keep a pain journal as well.   Last OV we also ordered a gastroenterology referral as she is never had a screening colonoscopy and I also sent her for her acute abdominal pain in upper abdominal quadrant radiating to her back and I suspected that  an EGD would maybe be appropriate.  I asked them to evaluate and treat patient.      - Did speak in [place] with GI on March 23 rd.   - GI did agree with EGD and colonoscopy.   told her to take tylenol prn pain, they will f/up once covid-19 over   Now constipated some- taking miralax.   Was loose prior.  So that is improving.  Under L rib -  Still with some dull aches, but a little better per pt   R/w pt her HLD again today.  Told her her ASCVD risk.  Q's answered.  Prudent diet/ lifestyle advised    The 10-year ASCVD risk score Mikey Bussing DC Jr., et al., 2013) is: 1.3%   Values used to calculate the score:     Age: 53 years     Sex: Female     Is Non-Hispanic African American: No     Diabetic: No      Tobacco smoker: No     Systolic Blood Pressure: 169 mmHg     Is BP treated: No     HDL Cholesterol: 60 mg/dL     Total Cholesterol: 224 mg/dL    Wt Readings from Last 3 Encounters:  05/18/18 194 lb (88 kg)  04/26/18 197 lb 11.2 oz (89.7 kg)  12/30/17 199 lb (90.3 kg)   BP Readings from Last 3 Encounters:  05/18/18 122/76  04/26/18 136/90  12/30/17 124/74   Pulse Readings from Last 3 Encounters:  04/26/18 73  02/24/16 76  12/30/15 69   BMI Readings from Last 3 Encounters:  05/18/18 37.89 kg/m  04/26/18 38.61 kg/m  12/30/17 38.86 kg/m     Patient Care Team    Relationship Specialty Notifications Start End  Mellody Dance, DO PCP - General Family Medicine  12/30/15   [name], MD [facility]  10/16/15      Patient Active Problem List   Diagnosis Date Noted  . Genital herpes 02/24/2016    Priority: High  . BMI 37.0-37.9, adult 10/16/2015    Priority: High  . GAD (generalized anxiety disorder) 10/16/2015    Priority: High  . Nonspecific chest pain 10/16/2015    Priority: High  . Unspecified menopausal and perimenopausal disorder 05/18/2018  . Hyperlipidemia 05/18/2018  . 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 Strong Family history of breast cancer in females 04/26/2018  . Hemorrhoids- small external nonthrombosed hemorrhoid perirectal area at 10 PM 02/24/2016  . External hemorrhoids without complication 67/89/3810  . Screening for multiple conditions 12/31/2015  . Leukocytosis 12/31/2015  . Environmental and seasonal allergies 12/01/2015  . Family history of early CAD 10/16/2015  . Genetic testing 09/21/2014  . Family history of breast cancer   . Family history of thyroid cancer   . h/o ovarian Endometriosis      Current Meds  Medication Sig  . FLUoxetine (PROZAC) 20 MG capsule Take 1 capsule (20 mg total) by mouth daily.     Allergies:  No Known Allergies   ROS:  See above HPI for pertinent positives and negatives    Objective:   Blood pressure 122/76, temperature (!) 96.6 F (35.9 C), temperature source Oral, height 5' (1.524 m), weight 194 lb (88 kg), last menstrual period 12/25/2015. Body mass index is 37.89 kg/m.   General: sounds in no acute distress.  Skin: Pt confirms warm and dry  extremities and pink fingertips Respiratory: speaking in full sentences, no conversational dyspnea Psych: A and  O *3, appears insight good, mood- full      Impression and Recommendations:    1. Abdominal pain, unspecified abdominal location- most prominent L upper quad - not W, maybe a little better.  Since there are no red flag sx currently, we discussed again her keeping a pain journal to get better idea of associations  - she will f/up with her GI and GYN in future when permitted  3. Hyperlipidemia, unspecified hyperlipidemia type - ascvd --> very low risk despite LDL at 148 - Counseling done regarding disease process and prudent diet/ lifestyle advised  All questions answered Reck yrly or so   4. Unspecified menopausal and perimenopausal disorder prozac per GYN- stable emotionally    - Novel Covid -19 counseling done; all questions were answered.   - Current CDC and federal guidelines reviewed with patient  - Reminded pt of extreme importance of social distancing; minimizing contacts with others, avoiding ALL but emergency appts etc. - Told patient to be prepared, not scared; and be smart for the sake of others - told to call with any concerns   I discussed the assessment and treatment plan with the patient. The patient was provided an opportunity to ask questions and all were answered. The patient agreed with the plan and demonstrated an understanding of the instructions.   The patient was advised to call back or seek an in-person evaluation if the symptoms worsen or if the condition fails to improve as anticipated.  Expresses verbal understanding and consents to current therapy and treatment  regimen.  No barriers to understanding were identified.  Red flag symptoms and signs discussed in detail.  Patient expressed understanding regarding what to do in case of emergency\urgent symptoms  Please see AVS handed out to patient at the end of our visit for further patient instructions/ counseling done pertaining to today's office visit.  I provided 20+ minutes of non-face-to-face time during this encounter.     Mellody Dance, DO

## 2018-05-24 ENCOUNTER — Encounter: Payer: 59 | Admitting: Family Medicine

## 2018-07-06 ENCOUNTER — Telehealth: Payer: Self-pay | Admitting: Gastroenterology

## 2018-07-06 NOTE — Telephone Encounter (Signed)
Per the last appt note the pt has been scheduled for colon and previsit.  The patient has been notified of this information and all questions answered. The pt has been advised of the information and verbalized understanding.

## 2018-07-21 ENCOUNTER — Other Ambulatory Visit: Payer: Self-pay

## 2018-07-21 ENCOUNTER — Ambulatory Visit (AMBULATORY_SURGERY_CENTER): Payer: Self-pay | Admitting: *Deleted

## 2018-07-21 VITALS — Ht 60.0 in | Wt 192.0 lb

## 2018-07-21 DIAGNOSIS — R1030 Lower abdominal pain, unspecified: Secondary | ICD-10-CM

## 2018-07-21 DIAGNOSIS — Z1211 Encounter for screening for malignant neoplasm of colon: Secondary | ICD-10-CM

## 2018-07-21 MED ORDER — NA SULFATE-K SULFATE-MG SULF 17.5-3.13-1.6 GM/177ML PO SOLN: ORAL | 0 refills | Status: DC

## 2018-07-21 NOTE — Progress Notes (Signed)
Pt's previsit is done over the phone and all paperwork (prep instructions, blank consent form to just read over, pre-procedure acknowledgement form and stamped envelope) sent to patient  No egg or soy allergy  No home oxygen or sleep apnea  No anesthesia or intubation issues  No diet medications taken  Registered in emmi  $15 off Suprep coupon mailed

## 2018-07-22 ENCOUNTER — Encounter: Payer: Self-pay | Admitting: Gastroenterology

## 2018-07-26 ENCOUNTER — Telehealth: Payer: Self-pay | Admitting: *Deleted

## 2018-07-26 NOTE — Telephone Encounter (Signed)

## 2018-07-28 ENCOUNTER — Encounter: Payer: Self-pay | Admitting: Gastroenterology

## 2018-07-28 ENCOUNTER — Other Ambulatory Visit: Payer: Self-pay

## 2018-07-28 ENCOUNTER — Ambulatory Visit (AMBULATORY_SURGERY_CENTER): Payer: 59 | Admitting: Gastroenterology

## 2018-07-28 VITALS — BP 155/71 | HR 71 | Temp 98.4°F | Resp 12 | Ht 60.0 in | Wt 192.0 lb

## 2018-07-28 DIAGNOSIS — K5730 Diverticulosis of large intestine without perforation or abscess without bleeding: Secondary | ICD-10-CM | POA: Diagnosis not present

## 2018-07-28 DIAGNOSIS — D123 Benign neoplasm of transverse colon: Secondary | ICD-10-CM

## 2018-07-28 DIAGNOSIS — D124 Benign neoplasm of descending colon: Secondary | ICD-10-CM | POA: Diagnosis not present

## 2018-07-28 DIAGNOSIS — K648 Other hemorrhoids: Secondary | ICD-10-CM | POA: Diagnosis not present

## 2018-07-28 DIAGNOSIS — D122 Benign neoplasm of ascending colon: Secondary | ICD-10-CM | POA: Diagnosis not present

## 2018-07-28 DIAGNOSIS — D125 Benign neoplasm of sigmoid colon: Secondary | ICD-10-CM

## 2018-07-28 DIAGNOSIS — Z1211 Encounter for screening for malignant neoplasm of colon: Secondary | ICD-10-CM

## 2018-07-28 MED ORDER — SODIUM CHLORIDE 0.9 % IV SOLN: 500.0000 mL | Freq: Once | INTRAVENOUS | Status: DC

## 2018-07-28 NOTE — Patient Instructions (Signed)
   INFORMATION ON POLYPS,DIVERTICULOSIS ,& HEMORRHOIDS GIVEN TO YOU TODAY   AWAIT PATHOLOGY RESULTS ON POLYPS REMOVED    YOU HAD AN ENDOSCOPIC PROCEDURE TODAY AT [facility]:   Refer to the procedure report that was given to you for any specific questions about what was found during the examination.  If the procedure report does not answer your questions, please call your gastroenterologist to clarify.  If you requested that your care partner not be given the details of your procedure findings, then the procedure report has been included in a sealed envelope for you to review at your convenience later.  YOU SHOULD EXPECT: Some feelings of bloating in the abdomen. Passage of more gas than usual.  Walking can help get rid of the air that was put into your GI tract during the procedure and reduce the bloating. If you had a lower endoscopy (such as a colonoscopy or flexible sigmoidoscopy) you may notice spotting of blood in your stool or on the toilet paper. If you underwent a bowel prep for your procedure, you may not have a normal bowel movement for a few days.  Please Note:  You might notice some irritation and congestion in your nose or some drainage.  This is from the oxygen used during your procedure.  There is no need for concern and it should clear up in a day or so.  SYMPTOMS TO REPORT IMMEDIATELY:   Following lower endoscopy (colonoscopy or flexible sigmoidoscopy):  Excessive amounts of blood in the stool  Significant tenderness or worsening of abdominal pains  Swelling of the abdomen that is new, acute  Fever of 100F or higher    For urgent or emergent issues, a gastroenterologist can be reached at any hour by calling [phone].   DIET:  We do recommend a small meal at first, but then you may proceed to your regular diet.  Drink plenty of fluids but you should avoid alcoholic beverages for 24 hours.  ACTIVITY:  You should plan to take it easy for the rest  of today and you should NOT DRIVE or use heavy machinery until tomorrow (because of the sedation medicines used during the test).    FOLLOW UP: Our staff will call the number listed on your records 48-72 hours following your procedure to check on you and address any questions or concerns that you may have regarding the information given to you following your procedure. If we do not reach you, we will leave a message.  We will attempt to reach you two times.  During this call, we will ask if you have developed any symptoms of COVID 19. If you develop any symptoms (ie: fever, flu-like symptoms, shortness of breath, cough etc.) before then, please call [phone].  If you test positive for Covid 19 in the 2 weeks post procedure, please call and report this information to [place].    If any biopsies were taken you will be contacted by phone or by letter within the next 1-3 weeks.  Please call us at [phone] if you have not heard about the biopsies in 3 weeks.    SIGNATURES/CONFIDENTIALITY: You and/or your care partner have signed paperwork which will be entered into your electronic medical record.  These signatures attest to the fact that that the information above on your After Visit Summary has been reviewed and is understood.  Full responsibility of the confidentiality of this discharge information lies with you and/or your care-partner.

## 2018-07-28 NOTE — Op Note (Signed)
[facility] Patient Name: Angela Ramsey Procedure Date: 07/28/2018 3:35 PM MRN: [id number] Endoscopist: Remo Lipps P. Havery Moros , MD Age: 53 Referring MD:  Date of Birth: October 12, 1965 Gender: Female Account #: [id number] Procedure:                Colonoscopy Indications:              Screening for colorectal malignant neoplasm, This                            is the patient's first colonoscopy, patient noted                            chronic lower abdominal pain of unclear etiology,                            CT scan negative Medicines:                Monitored Anesthesia Care Procedure:                Pre-Anesthesia Assessment:                           - Prior to the procedure, a History and Physical                            was performed, and patient medications and                            allergies were reviewed. The patient's tolerance of                            previous anesthesia was also reviewed. The risks                            and benefits of the procedure and the sedation                            options and risks were discussed with the patient.                            All questions were answered, and informed consent                            was obtained. Prior Anticoagulants: The patient has                            taken no previous anticoagulant or antiplatelet                            agents. ASA Grade Assessment: II - A patient with                            mild systemic disease. After reviewing the risks  and benefits, the patient was deemed in                            satisfactory condition to undergo the procedure.                           After obtaining informed consent, the colonoscope                            was passed under direct vision. Throughout the                            procedure, the patient's blood pressure, pulse, and                            oxygen saturations were monitored  continuously. The                            Model PCF-[id number]) scope was introduced                            through the anus and advanced to the the terminal                            ileum, with identification of the appendiceal                            orifice and IC valve. The colonoscopy was performed                            without difficulty. The patient tolerated the                            procedure well. The quality of the bowel                            preparation was good. The terminal ileum, ileocecal                            valve, appendiceal orifice, and rectum were                            photographed. Scope In: 3:41:50 PM Scope Out: 4:01:56 PM Scope Withdrawal Time: 0 hours 13 minutes 41 seconds  Total Procedure Duration: 0 hours 20 minutes 6 seconds  Findings:                 The perianal and digital rectal examinations were                            normal.                           The terminal ileum appeared normal.  A 3 mm polyp was found in the ascending colon. The                            polyp was sessile. The polyp was removed with a                            cold snare. Resection and retrieval were complete.                           Two sessile polyps were found in the transverse                            colon. The polyps were 2 to 5 mm in size. These                            polyps were removed with a cold snare. Resection                            and retrieval were complete.                           A 5 mm polyp was found in the descending colon. The                            polyp was sessile. The polyp was removed with a                            cold snare. Resection and retrieval were complete.                           A 4 mm polyp was found in the sigmoid colon. The                            polyp was sessile. The polyp was removed with a                            cold snare.  Resection and retrieval were complete.                           A few small-mouthed diverticula were found in the                            sigmoid colon.                           Small hemorrhoids.The exam was otherwise without                            abnormality on direct and retroflexion views. Complications:            No immediate complications. Estimated blood loss:  Minimal. Estimated Blood Loss:     Estimated blood loss was minimal. Impression:               - The examined portion of the ileum was normal.                           - One 3 mm polyp in the ascending colon, removed                            with a cold snare. Resected and retrieved.                           - Two 2 to 5 mm polyps in the transverse colon,                            removed with a cold snare. Resected and retrieved.                           - One 5 mm polyp in the descending colon, removed                            with a cold snare. Resected and retrieved.                           - One 4 mm polyp in the sigmoid colon, removed with                            a cold snare. Resected and retrieved.                           - Diverticulosis in the sigmoid colon.                           - Small hemorrhoids                           - The examination was otherwise normal on direct                            and retroflexion views.                           No cause for patient's pain on this exam, which                            could be musculoskeletal in etiology. Recommendation:           - Patient has a contact number available for                            emergencies. The signs and symptoms of potential                            delayed complications were discussed with the  patient. Return to normal activities tomorrow.                            Written discharge instructions were provided to the                             patient.                           - Resume previous diet.                           - Continue present medications.                           - Await pathology results. Remo Lipps P. Armbruster, MD 07/28/2018 4:09:51 PM This report has been signed electronically.

## 2018-07-28 NOTE — Progress Notes (Signed)
A and O x3. Report to RN. Tolerated MAC anesthesia well.

## 2018-08-01 ENCOUNTER — Telehealth: Payer: Self-pay

## 2018-08-01 NOTE — Telephone Encounter (Signed)
  Follow up Call-  Call back number 07/28/2018  Post procedure Call Back phone  # [phone]  Permission to leave phone message Yes  Some recent data might be hidden     Patient questions:  Do you have a fever, pain , or abdominal swelling? No. Pain Score  0 *  Have you tolerated food without any problems? Yes.    Have you been able to return to your normal activities? Yes.    Do you have any questions about your discharge instructions: Diet   No. Medications  No. Follow up visit  No.  Do you have questions or concerns about your Care? No.  Actions: * If pain score is 4 or above: No action needed, pain <4.  1. Have you developed a fever since your procedure? no  2.   Have you had an respiratory symptoms (SOB or cough) since your procedure?no  3.   Have you tested positive for COVID 19 since your procedure no  4.   Have you had any family members/close contacts diagnosed with the COVID 19 since your procedure?  No    If yes to any of these questions please route to Joylene John, RN and Alphonsa Gin, RN.

## 2018-08-09 ENCOUNTER — Other Ambulatory Visit: Payer: Self-pay

## 2018-08-10 ENCOUNTER — Encounter: Payer: Self-pay | Admitting: Gynecology

## 2018-08-10 ENCOUNTER — Ambulatory Visit (INDEPENDENT_AMBULATORY_CARE_PROVIDER_SITE_OTHER): Payer: 59 | Admitting: Gynecology

## 2018-08-10 VITALS — BP 130/82

## 2018-08-10 DIAGNOSIS — R102 Pelvic and perineal pain unspecified side: Secondary | ICD-10-CM

## 2018-08-10 DIAGNOSIS — N63 Unspecified lump in unspecified breast: Secondary | ICD-10-CM | POA: Diagnosis not present

## 2018-08-10 NOTE — Progress Notes (Addendum)
    Angela Ramsey 05-31-1965 [id number]        53 y.o.  G2P2002 presents with 2 issues:  1. Right breast mass noted within the past week.  Is tender when palpates.  No nipple discharge.  History of ATM mutation with increased calculated breast cancer risk at 23.9%.  Had mammogram 03/2018 and MRI 01/2018 were normal. 2. Lower abdominal/pelvic pain over the last 4 months.  Has been evaluated by GI to include a CT scan which was negative.  Is having issues with constipation.  Pain occurs daily as a cramping to dull aching.  Has done no bleeding.  She is status post ablation and has had no bleeding for the past several years.  Is having night sweats.  No urinary symptoms.  No nausea vomiting.  Past medical history,surgical history, problem list, medications, allergies, family history and social history were all reviewed and documented in the EPIC chart.  Directed ROS with pertinent positives and negatives documented in the history of present illness/assessment and plan.  Exam: [name] assistant Vitals:   08/10/18 1419  BP: 130/82   General appearance:  Normal HEENT normal Both breasts examined lying and sitting.  Left without masses retractions discharge adenopathy.  Right with ill-defined fibroglandular area 11:30 position 3 fingerbreadths off the areola.  No overlying skin changes nipple discharge or axillary adenopathy. Abdomen soft nontender without masses guarding rebound Pelvic external BUS vagina normal.  Cervix normal.  Uterus grossly normal size midline mobile nontender.  Adnexa without masses or tenderness.  Assessment/Plan:  53 y.o. [id number] with:  1. Right breast mass.  Ill-defined consistent with fibroglandular tissue.  No definitive masses.  Discussed differential to include physiologic, cysts, cancer.  Recommend starting with diagnostic mammography and ultrasound.  Patient knows to call my office if she does not hear from the radiology department to schedule. 2. Lower  abdominal discomfort.  History of ablation in the past.  Without menses for several years.  Is having some hot flashes.  Check baseline FSH and pelvic ultrasound to rule out small hematometria or other pelvic pathology.  CT scan was normal as was physical exam.  Urine analysis appears contaminated.  Will follow up on culture and treat if needed.  Patient will schedule in follow-up for this.    [name] MD, 2:46 PM 08/10/2018

## 2018-08-10 NOTE — Addendum Note (Signed)
Addended by: Nelva Nay on: 08/10/2018 03:03 PM   Modules accepted: Orders

## 2018-08-10 NOTE — Patient Instructions (Signed)
Follow-up for the pelvic ultrasound as scheduled.  Radiologist should call you to schedule the mammogram and ultrasound.  If you do not hear from them within the week call my office.

## 2018-08-11 ENCOUNTER — Encounter: Payer: Self-pay | Admitting: Gynecology

## 2018-08-11 ENCOUNTER — Telehealth: Payer: Self-pay | Admitting: *Deleted

## 2018-08-11 DIAGNOSIS — N63 Unspecified lump in unspecified breast: Secondary | ICD-10-CM

## 2018-08-11 DIAGNOSIS — Z803 Family history of malignant neoplasm of breast: Secondary | ICD-10-CM

## 2018-08-11 LAB — FOLLICLE STIMULATING HORMONE: FSH: 86.8 m[IU]/mL

## 2018-08-11 NOTE — Telephone Encounter (Signed)
-----   Message from [name], MD sent at 08/10/2018  2:51 PM EDT ----- Schedule diagnostic mammography and ultrasound right breast reference new onset breast density on exam 11:30 position 3 fingerbreadths off the areola in high risk patient

## 2018-08-11 NOTE — Telephone Encounter (Signed)
Patient scheduled on 08/17/18 @ 9:25 am, patient informed and aware of COVID guidelines

## 2018-08-12 LAB — URINE CULTURE
MICRO NUMBER:: 606884
SPECIMEN QUALITY:: ADEQUATE

## 2018-08-12 LAB — URINALYSIS, COMPLETE W/RFL CULTURE
Bilirubin Urine: NEGATIVE
Glucose, UA: NEGATIVE
Hgb urine dipstick: NEGATIVE
Hyaline Cast: NONE SEEN /LPF
Ketones, ur: NEGATIVE
Nitrites, Initial: NEGATIVE
Protein, ur: NEGATIVE
RBC / HPF: NONE SEEN /HPF (ref 0–2)
Specific Gravity, Urine: 1.02 (ref 1.001–1.03)
pH: 7 (ref 5.0–8.0)

## 2018-08-12 LAB — CULTURE INDICATED

## 2018-08-17 ENCOUNTER — Ambulatory Visit
Admission: RE | Admit: 2018-08-17 | Discharge: 2018-08-17 | Disposition: A | Discharge status: Home / Self Care | Payer: 59 | Source: Ambulatory Visit | Attending: Gynecology | Admitting: Gynecology

## 2018-08-17 ENCOUNTER — Other Ambulatory Visit: Payer: Self-pay

## 2018-08-17 DIAGNOSIS — Z803 Family history of malignant neoplasm of breast: Secondary | ICD-10-CM

## 2018-08-17 DIAGNOSIS — N63 Unspecified lump in unspecified breast: Secondary | ICD-10-CM

## 2018-08-18 ENCOUNTER — Encounter: Payer: Self-pay | Admitting: Gynecology

## 2018-08-23 ENCOUNTER — Other Ambulatory Visit: Payer: Self-pay

## 2018-08-24 ENCOUNTER — Ambulatory Visit (INDEPENDENT_AMBULATORY_CARE_PROVIDER_SITE_OTHER): Payer: 59

## 2018-08-24 ENCOUNTER — Ambulatory Visit (INDEPENDENT_AMBULATORY_CARE_PROVIDER_SITE_OTHER): Payer: 59 | Admitting: Gynecology

## 2018-08-24 ENCOUNTER — Encounter: Payer: Self-pay | Admitting: Gynecology

## 2018-08-24 VITALS — BP 118/78

## 2018-08-24 DIAGNOSIS — R102 Pelvic and perineal pain: Secondary | ICD-10-CM

## 2018-08-24 NOTE — Patient Instructions (Addendum)
Continue with self breast exams monthly.  Follow-up if the area in your breast changes at all.  Follow-up this coming fall when due for annual exam.

## 2018-08-24 NOTE — Progress Notes (Signed)
    Angela Ramsey 05-10-1965 [id number]        53 y.o.  G2P2002 presents for ultrasound due to 4-5 months of lower abdominal pain.  Had been evaluated by GI with a normal CT scan.  Is having issues with constipation.  Describes pain is daily cramping to dull aching.  She is done no bleeding.  She is status post ablation in the past with no menses/bleeding for several years.  Recent urine analysis was negative and FSH was 86.  Past medical history,surgical history, problem list, medications, allergies, family history and social history were all reviewed and documented in the EPIC chart.  Directed ROS with pertinent positives and negatives documented in the history of present illness/assessment and plan.  Exam: Vitals:   08/24/18 1635  BP: 118/78   General appearance:  Normal  Ultrasound transvaginal shows uterus normal size and echotexture.  Endometrial echo unable to be identified due to history of ablation.  Right and left ovaries normal and inactive in appearance.  Cul-de-sac negative.  Assessment/Plan:  53 y.o. [id number] with 4 to 16-month history of lower abdominal cramping.  No bleeding.  Perimenopausal noting elevated FSH and having some hot flushes and sweats.  Ultrasound negative for pelvic pathology.  Discussed with patient that I think her symptoms are more than likely GI due to her constipation.  Using stool softeners, fiber and considering probiotics.  Will monitor for now.  Will follow-up if continues to be an issue where she has any bleeding.  She did have an area of ill-defined fibroglandular density right breast that she had a diagnostic mammography and ultrasound for which showed normal breast tissue.  She is going to continue with self breast exams and as long as this area remained stable will follow.  If it changes at all she will read present for further evaluation.  She is due for an annual exam this fall and will follow-up at that time.    [name] MD, 4:51  PM 08/24/2018

## 2018-09-01 ENCOUNTER — Telehealth: Payer: Self-pay | Admitting: Family Medicine

## 2018-09-01 DIAGNOSIS — R1030 Lower abdominal pain, unspecified: Secondary | ICD-10-CM

## 2018-09-01 DIAGNOSIS — R350 Frequency of micturition: Secondary | ICD-10-CM

## 2018-09-01 NOTE — Telephone Encounter (Signed)
Called and spoke to the patient she has been having frequent urination and abd pains since feb.  Please advise if approved to place referral for urology. MPulliam, CMA/RT(R)

## 2018-09-01 NOTE — Telephone Encounter (Signed)
Yes since she has been having constant UTI type symptoms and, we do not do urine cath here, she can be referred to urology-what sounds like it would be the best move at this time

## 2018-09-01 NOTE — Telephone Encounter (Signed)
Patient called states she has been constantly having UTI --- pt states all gvn UA specimen results come back  Contaminated, no reason ever given except catch must not be done correctly but Patient states has been doing for years & know how to collect the urine ,she request to come here for a [place] catch of urine, advised we do NOT do those in this office and told her would send a referral request to Urology for her to go there .   --Forwarding referral request to medical assistant for review w/ provider &  Referral order.  --- Please call pt @ [phone] if questions or concerns. -glh

## 2018-09-01 NOTE — Telephone Encounter (Signed)
Referral placed. MPulliam, CMA/RT(R)

## 2018-10-26 ENCOUNTER — Encounter: Payer: Self-pay | Admitting: Gynecology

## 2018-10-26 NOTE — Telephone Encounter (Signed)
It was difficult to see the lining of the uterus with her ultrasound but we did not see any significant pathology.  I think I would recommend watching for now and if the spotting continue would need to reevaluate her endometrium.

## 2018-11-09 ENCOUNTER — Encounter: Payer: Self-pay | Admitting: Gynecology

## 2018-11-17 ENCOUNTER — Encounter: Payer: Self-pay | Admitting: Gynecology

## 2018-11-17 NOTE — Telephone Encounter (Signed)
I would schedule sonohysterogram to try to get a better look at the endometrium.  It may be difficult due to her ablation but we will give it a try.

## 2018-11-18 ENCOUNTER — Other Ambulatory Visit: Payer: Self-pay

## 2018-11-18 DIAGNOSIS — N926 Irregular menstruation, unspecified: Secondary | ICD-10-CM

## 2018-11-18 NOTE — Telephone Encounter (Signed)
Scheduled appointment for October 26.

## 2018-12-05 ENCOUNTER — Encounter: Payer: Self-pay | Admitting: Family Medicine

## 2018-12-05 ENCOUNTER — Ambulatory Visit (INDEPENDENT_AMBULATORY_CARE_PROVIDER_SITE_OTHER): Payer: 59 | Admitting: Family Medicine

## 2018-12-05 ENCOUNTER — Other Ambulatory Visit: Payer: Self-pay

## 2018-12-05 VITALS — BP 112/75 | HR 64 | Temp 98.4°F | Resp 16 | Ht 60.0 in | Wt 203.0 lb

## 2018-12-05 DIAGNOSIS — Z130 Encounter for screening for diseases of the blood and blood-forming organs and certain disorders involving the immune mechanism: Secondary | ICD-10-CM

## 2018-12-05 DIAGNOSIS — E785 Hyperlipidemia, unspecified: Secondary | ICD-10-CM

## 2018-12-05 DIAGNOSIS — Z1389 Encounter for screening for other disorder: Secondary | ICD-10-CM

## 2018-12-05 DIAGNOSIS — Z0000 Encounter for general adult medical examination without abnormal findings: Secondary | ICD-10-CM | POA: Diagnosis not present

## 2018-12-05 DIAGNOSIS — F411 Generalized anxiety disorder: Secondary | ICD-10-CM

## 2018-12-05 DIAGNOSIS — Z23 Encounter for immunization: Secondary | ICD-10-CM | POA: Diagnosis not present

## 2018-12-05 DIAGNOSIS — Z1329 Encounter for screening for other suspected endocrine disorder: Secondary | ICD-10-CM

## 2018-12-05 DIAGNOSIS — Z131 Encounter for screening for diabetes mellitus: Secondary | ICD-10-CM

## 2018-12-05 DIAGNOSIS — Z0001 Encounter for general adult medical examination with abnormal findings: Secondary | ICD-10-CM

## 2018-12-05 NOTE — Patient Instructions (Addendum)
Please follow-up in two months for second singles vaccine.  Preventive Care for Adults, Female  A healthy lifestyle and preventive care can promote health and wellness. Preventive health guidelines for women include the following key practices.   A routine yearly physical is a good way to check with your health care provider about your health and preventive screening. It is a chance to share any concerns and updates on your health and to receive a thorough exam.   Visit your dentist for a routine exam and preventive care every 6 months. Brush your teeth twice a day and floss once a day. Good oral hygiene prevents tooth decay and gum disease.   The frequency of eye exams is based on your age, health, family medical history, use of contact lenses, and other factors. Follow your health care provider's recommendations for frequency of eye exams.   Eat a healthy diet. Foods like vegetables, fruits, whole grains, low-fat dairy products, and lean protein foods contain the nutrients you need without too many calories. Decrease your intake of foods high in solid fats, added sugars, and salt. Eat the right amount of calories for you.Get information about a proper diet from your health care provider, if necessary.   Regular physical exercise is one of the most important things you can do for your health. Most adults should get at least 150 minutes of moderate-intensity exercise (any activity that increases your heart rate and causes you to sweat) each week. In addition, most adults need muscle-strengthening exercises on 2 or more days a week.   Maintain a healthy weight. The body mass index (BMI) is a screening tool to identify possible weight problems. It provides an estimate of body fat based on height and weight. Your health care provider can find your BMI, and can help you achieve or maintain a healthy weight.For adults 20 years and older:   - A BMI below 18.5 is considered underweight.   - A BMI  of 18.5 to 24.9 is normal.   - A BMI of 25 to 29.9 is considered overweight.   - A BMI of 30 and above is considered obese.   Maintain normal blood lipids and cholesterol levels by exercising and minimizing your intake of trans and saturated fats.  Eat a balanced diet with plenty of fruit and vegetables. Blood tests for lipids and cholesterol should begin at age 82 and be repeated every 5 years minimum.  If your lipid or cholesterol levels are high, you are over 40, or you are at high risk for heart disease, you may need your cholesterol levels checked more frequently.Ongoing high lipid and cholesterol levels should be treated with medicines if diet and exercise are not working.   If you smoke, find out from your health care provider how to quit. If you do not use tobacco, do not start.   Lung cancer screening is recommended for adults aged 8-80 years who are at high risk for developing lung cancer because of a history of smoking. A yearly low-dose CT scan of the lungs is recommended for people who have at least a 30-pack-year history of smoking and are a current smoker or have quit within the past 15 years. A pack year of smoking is smoking an average of 1 pack of cigarettes a day for 1 year (for example: 1 pack a day for 30 years or 2 packs a day for 15 years). Yearly screening should continue until the smoker has stopped smoking for at least 15 years.  Yearly screening should be stopped for people who develop a health problem that would prevent them from having lung cancer treatment.   If you are pregnant, do not drink alcohol. If you are breastfeeding, be very cautious about drinking alcohol. If you are not pregnant and choose to drink alcohol, do not have more than 1 drink per day. One drink is considered to be 12 ounces (355 mL) of beer, 5 ounces (148 mL) of wine, or 1.5 ounces (44 mL) of liquor.   Avoid use of street drugs. Do not share needles with anyone. Ask for help if you need support  or instructions about stopping the use of drugs.   High blood pressure causes heart disease and increases the risk of stroke. Your blood pressure should be checked at least yearly.  Ongoing high blood pressure should be treated with medicines if weight loss and exercise do not work.   If you are 18-35 years old, ask your health care provider if you should take aspirin to prevent strokes.   Diabetes screening involves taking a blood sample to check your fasting blood sugar level. This should be done once every 3 years, after age 68, if you are within normal weight and without risk factors for diabetes. Testing should be considered at a younger age or be carried out more frequently if you are overweight and have at least 1 risk factor for diabetes.   Breast cancer screening is essential preventive care for women. You should practice "breast self-awareness."  This means understanding the normal appearance and feel of your breasts and may include breast self-examination.  Any changes detected, no matter how small, should be reported to a health care provider.  Women in their 59s and 30s should have a clinical breast exam (CBE) by a health care provider as part of a regular health exam every 1 to 3 years.  After age 68, women should have a CBE every year.  Starting at age 62, women should consider having a mammogram (breast X-ray test) every year.  Women who have a family history of breast cancer should talk to their health care provider about genetic screening.  Women at a high risk of breast cancer should talk to their health care providers about having an MRI and a mammogram every year.   -Breast cancer gene (BRCA)-related cancer risk assessment is recommended for women who have family members with BRCA-related cancers. BRCA-related cancers include breast, ovarian, tubal, and peritoneal cancers. Having family members with these cancers may be associated with an increased risk for harmful changes  (mutations) in the breast cancer genes BRCA1 and BRCA2. Results of the assessment will determine the need for genetic counseling and BRCA1 and BRCA2 testing.   The Pap test is a screening test for cervical cancer. A Pap test can show cell changes on the cervix that might become cervical cancer if left untreated. A Pap test is a procedure in which cells are obtained and examined from the lower end of the uterus (cervix).   - Women should have a Pap test starting at age 76.   - Between ages 107 and 4, Pap tests should be repeated every 2 years.   - Beginning at age 58, you should have a Pap test every 3 years as long as the past 3 Pap tests have been normal.   - Some women have medical problems that increase the chance of getting cervical cancer. Talk to your health care provider about these problems. It is especially  important to talk to your health care provider if a new problem develops soon after your last Pap test. In these cases, your health care provider may recommend more frequent screening and Pap tests.   - The above recommendations are the same for women who have or have not gotten the vaccine for human papillomavirus (HPV).   - If you had a hysterectomy for a problem that was not cancer or a condition that could lead to cancer, then you no longer need Pap tests. Even if you no longer need a Pap test, a regular exam is a good idea to make sure no other problems are starting.   - If you are between ages 3 and 34 years, and you have had normal Pap tests going back 10 years, you no longer need Pap tests. Even if you no longer need a Pap test, a regular exam is a good idea to make sure no other problems are starting.   - If you have had past treatment for cervical cancer or a condition that could lead to cancer, you need Pap tests and screening for cancer for at least 20 years after your treatment.   - If Pap tests have been discontinued, risk factors (such as a new sexual partner) need to  be reassessed to determine if screening should be resumed.   - The HPV test is an additional test that may be used for cervical cancer screening. The HPV test looks for the virus that can cause the cell changes on the cervix. The cells collected during the Pap test can be tested for HPV. The HPV test could be used to screen women aged 37 years and older, and should be used in women of any age who have unclear Pap test results. After the age of 42, women should have HPV testing at the same frequency as a Pap test.   Colorectal cancer can be detected and often prevented. Most routine colorectal cancer screening begins at the age of 73 years and continues through age 60 years. However, your health care provider may recommend screening at an earlier age if you have risk factors for colon cancer. On a yearly basis, your health care provider may provide home test kits to check for hidden blood in the stool.  Use of a small camera at the end of a tube, to directly examine the colon (sigmoidoscopy or colonoscopy), can detect the earliest forms of colorectal cancer. Talk to your health care provider about this at age 22, when routine screening begins. Direct exam of the colon should be repeated every 5 -10 years through age 56 years, unless early forms of pre-cancerous polyps or small growths are found.   People who are at an increased risk for hepatitis B should be screened for this virus. You are considered at high risk for hepatitis B if:  -You were born in a country where hepatitis B occurs often. Talk with your health care provider about which countries are considered high risk.  - Your parents were born in a high-risk country and you have not received a shot to protect against hepatitis B (hepatitis B vaccine).  - You have HIV or AIDS.  - You use needles to inject street drugs.  - You live with, or have sex with, someone who has Hepatitis B.  - You get hemodialysis treatment.  - You take certain  medicines for conditions like cancer, organ transplantation, and autoimmune conditions.   Hepatitis C blood testing is recommended for  all people born from [birth year] through 1963 and any individual with known risks for hepatitis C.   Practice safe sex. Use condoms and avoid high-risk sexual practices to reduce the spread of sexually transmitted infections (STIs). STIs include gonorrhea, chlamydia, syphilis, trichomonas, herpes, HPV, and human immunodeficiency virus (HIV). Herpes, HIV, and HPV are viral illnesses that have no cure. They can result in disability, cancer, and death. Sexually active women aged 43 years and younger should be checked for chlamydia. Older women with new or multiple partners should also be tested for chlamydia. Testing for other STIs is recommended if you are sexually active and at increased risk.   Osteoporosis is a disease in which the bones lose minerals and strength with aging. This can result in serious bone fractures or breaks. The risk of osteoporosis can be identified using a bone density scan. Women ages 29 years and over and women at risk for fractures or osteoporosis should discuss screening with their health care providers. Ask your health care provider whether you should take a calcium supplement or vitamin D to There are also several preventive steps women can take to avoid osteoporosis and resulting fractures or to keep osteoporosis from worsening. -->Recommendations include:  Eat a balanced diet high in fruits, vegetables, calcium, and vitamins.  Get enough calcium. The recommended total intake of is 1,200 mg daily; for best absorption, if taking supplements, divide doses into 250-500 mg doses throughout the day. Of the two types of calcium, calcium carbonate is best absorbed when taken with food but calcium citrate can be taken on an empty stomach.  Get enough vitamin D. [employer] and the [employer] recommend at least 1,000 IU per day for women  age 64 and over who are at risk of vitamin D deficiency. Vitamin D deficiency can be caused by inadequate sun exposure (for example, those who live in [place]).  Avoid alcohol and smoking. Heavy alcohol intake (more than 7 drinks per week) increases the risk of falls and hip fracture and women smokers tend to lose bone more rapidly and have lower bone mass than nonsmokers. Stopping smoking is one of the most important changes women can make to improve their health and decrease risk for disease.  Be physically active every day. Weight-bearing exercise (for example, fast walking, hiking, jogging, and weight training) may strengthen bones or slow the rate of bone loss that comes with aging. Balancing and muscle-strengthening exercises can reduce the risk of falling and fracture.  Consider therapeutic medications. Currently, several types of effective drugs are available. Healthcare providers can recommend the type most appropriate for each woman.  Eliminate environmental factors that may contribute to accidents. Falls cause nearly 90% of all osteoporotic fractures, so reducing this risk is an important bone-health strategy. Measures include ample lighting, removing obstructions to walking, using nonskid rugs on floors, and placing mats and/or grab bars in showers.  Be aware of medication side effects. Some common medicines make bones weaker. These include a type of steroid drug called glucocorticoids used for arthritis and asthma, some antiseizure drugs, certain sleeping pills, treatments for endometriosis, and some cancer drugs. An overactive thyroid gland or using too much thyroid hormone for an underactive thyroid can also be a problem. If you are taking these medicines, talk to your doctor about what you can do to help protect your bones.reduce the rate of osteoporosis.    Menopause can be associated with physical symptoms and risks. Hormone replacement therapy is available to decrease  symptoms  and risks. You should talk to your health care provider about whether hormone replacement therapy is right for you.   Use sunscreen. Apply sunscreen liberally and repeatedly throughout the day. You should seek shade when your shadow is shorter than you. Protect yourself by wearing long sleeves, pants, a wide-brimmed hat, and sunglasses year round, whenever you are outdoors.   Once a month, do a whole body skin exam, using a mirror to look at the skin on your back. Tell your health care provider of new moles, moles that have irregular borders, moles that are larger than a pencil eraser, or moles that have changed in shape or color.   -Stay current with required vaccines (immunizations).   Influenza vaccine. All adults should be immunized every year.  Tetanus, diphtheria, and acellular pertussis (Td, Tdap) vaccine. Pregnant women should receive 1 dose of Tdap vaccine during each pregnancy. The dose should be obtained regardless of the length of time since the last dose. Immunization is preferred during the 27th 36th week of gestation. An adult who has not previously received Tdap or who does not know her vaccine status should receive 1 dose of Tdap. This initial dose should be followed by tetanus and diphtheria toxoids (Td) booster doses every 10 years. Adults with an unknown or incomplete history of completing a 3-dose immunization series with Td-containing vaccines should begin or complete a primary immunization series including a Tdap dose. Adults should receive a Td booster every 10 years.  Varicella vaccine. An adult without evidence of immunity to varicella should receive 2 doses or a second dose if she has previously received 1 dose. Pregnant females who do not have evidence of immunity should receive the first dose after pregnancy. This first dose should be obtained before leaving the health care facility. The second dose should be obtained 4 8 weeks after the first dose.  Human  papillomavirus (HPV) vaccine. Females aged 30 26 years who have not received the vaccine previously should obtain the 3-dose series. The vaccine is not recommended for use in pregnant females. However, pregnancy testing is not needed before receiving a dose. If a female is found to be pregnant after receiving a dose, no treatment is needed. In that case, the remaining doses should be delayed until after the pregnancy. Immunization is recommended for any person with an immunocompromised condition through the age of 33 years if she did not get any or all doses earlier. During the 3-dose series, the second dose should be obtained 4 8 weeks after the first dose. The third dose should be obtained 24 weeks after the first dose and 16 weeks after the second dose.  Zoster vaccine. One dose is recommended for adults aged 60 years or older unless certain conditions are present.  Measles, mumps, and rubella (MMR) vaccine. Adults born before 39 generally are considered immune to measles and mumps. Adults born in [birth year] or later should have 1 or more doses of MMR vaccine unless there is a contraindication to the vaccine or there is laboratory evidence of immunity to each of the three diseases. A routine second dose of MMR vaccine should be obtained at least 28 days after the first dose for students attending postsecondary schools, health care workers, or international travelers. People who received inactivated measles vaccine or an unknown type of measles vaccine during 1963 1967 should receive 2 doses of MMR vaccine. People who received inactivated mumps vaccine or an unknown type of mumps vaccine before 1979 and are at high risk  for mumps infection should consider immunization with 2 doses of MMR vaccine. For females of childbearing age, rubella immunity should be determined. If there is no evidence of immunity, females who are not pregnant should be vaccinated. If there is no evidence of immunity, females who are pregnant  should delay immunization until after pregnancy. Unvaccinated health care workers born before [birth year] who lack laboratory evidence of measles, mumps, or rubella immunity or laboratory confirmation of disease should consider measles and mumps immunization with 2 doses of MMR vaccine or rubella immunization with 1 dose of MMR vaccine.  Pneumococcal 13-valent conjugate (PCV13) vaccine. When indicated, a person who is uncertain of her immunization history and has no record of immunization should receive the PCV13 vaccine. An adult aged 23 years or older who has certain medical conditions and has not been previously immunized should receive 1 dose of PCV13 vaccine. This PCV13 should be followed with a dose of pneumococcal polysaccharide (PPSV23) vaccine. The PPSV23 vaccine dose should be obtained at least 8 weeks after the dose of PCV13 vaccine. An adult aged 57 years or older who has certain medical conditions and previously received 1 or more doses of PPSV23 vaccine should receive 1 dose of PCV13. The PCV13 vaccine dose should be obtained 1 or more years after the last PPSV23 vaccine dose.  Pneumococcal polysaccharide (PPSV23) vaccine. When PCV13 is also indicated, PCV13 should be obtained first. All adults aged 63 years and older should be immunized. An adult younger than age 60 years who has certain medical conditions should be immunized. Any person who resides in a nursing home or long-term care facility should be immunized. An adult smoker should be immunized. People with an immunocompromised condition and certain other conditions should receive both PCV13 and PPSV23 vaccines. People with human immunodeficiency virus (HIV) infection should be immunized as soon as possible after diagnosis. Immunization during chemotherapy or radiation therapy should be avoided. Routine use of PPSV23 vaccine is not recommended for American Indians, [place]atives, or people younger than 65 years unless there are medical conditions  that require PPSV23 vaccine. When indicated, people who have unknown immunization and have no record of immunization should receive PPSV23 vaccine. One-time revaccination 5 years after the first dose of PPSV23 is recommended for people aged 39 64 years who have chronic kidney failure, nephrotic syndrome, asplenia, or immunocompromised conditions. People who received 1 2 doses of PPSV23 before age 45 years should receive another dose of PPSV23 vaccine at age 65 years or later if at least 5 years have passed since the previous dose. Doses of PPSV23 are not needed for people immunized with PPSV23 at or after age 80 years.  Meningococcal vaccine. Adults with asplenia or persistent complement component deficiencies should receive 2 doses of quadrivalent meningococcal conjugate (MenACWY-D) vaccine. The doses should be obtained at least 2 months apart. Microbiologists working with certain meningococcal bacteria, [employer] recruits, people at risk during an outbreak, and people who travel to or live in countries with a high rate of meningitis should be immunized. A first-year college student up through age 47 years who is living in a residence hall should receive a dose if she did not receive a dose on or after her 16th birthday. Adults who have certain high-risk conditions should receive one or more doses of vaccine.  Hepatitis A vaccine. Adults who wish to be protected from this disease, have certain high-risk conditions, work with hepatitis A-infected animals, work in hepatitis A research labs, or travel to or work in  countries with a high rate of hepatitis A should be immunized. Adults who were previously unvaccinated and who anticipate close contact with an international adoptee during the first 60 days after arrival in the [place] from a country with a high rate of hepatitis A should be immunized.  Hepatitis B vaccine.  Adults who wish to be protected from this disease, have certain high-risk conditions,  may be exposed to blood or other infectious body fluids, are household contacts or sex partners of hepatitis B positive people, are clients or workers in certain care facilities, or travel to or work in countries with a high rate of hepatitis B should be immunized.  Haemophilus influenzae type b (Hib) vaccine. A previously unvaccinated person with asplenia or sickle cell disease or having a scheduled splenectomy should receive 1 dose of Hib vaccine. Regardless of previous immunization, a recipient of a hematopoietic stem cell transplant should receive a 3-dose series 6 12 months after her successful transplant. Hib vaccine is not recommended for adults with HIV infection.  Preventive Services / Frequency Ages 50 to 39years  Blood pressure check.** / Every 1 to 2 years.  Lipid and cholesterol check.** / Every 5 years beginning at age 38.  Clinical breast exam.** / Every 3 years for women in their 73s and 44s.  BRCA-related cancer risk assessment.** / For women who have family members with a BRCA-related cancer (breast, ovarian, tubal, or peritoneal cancers).  Pap test.** / Every 2 years from ages 36 through 56. Every 3 years starting at age 24 through age 53 or 34 with a history of 3 consecutive normal Pap tests.  HPV screening.** / Every 3 years from ages 81 through ages 21 to 46 with a history of 3 consecutive normal Pap tests.  Hepatitis C blood test.** / For any individual with known risks for hepatitis C.  Skin self-exam. / Monthly.  Influenza vaccine. / Every year.  Tetanus, diphtheria, and acellular pertussis (Tdap, Td) vaccine.** / Consult your health care provider. Pregnant women should receive 1 dose of Tdap vaccine during each pregnancy. 1 dose of Td every 10 years.  Varicella vaccine.** / Consult your health care provider. Pregnant females who do not have evidence of immunity should receive the first dose after pregnancy.  HPV vaccine. / 3 doses over 6 months, if 54 and  younger. The vaccine is not recommended for use in pregnant females. However, pregnancy testing is not needed before receiving a dose.  Measles, mumps, rubella (MMR) vaccine.** / You need at least 1 dose of MMR if you were born in 1957 or later. You may also need a 2nd dose. For females of childbearing age, rubella immunity should be determined. If there is no evidence of immunity, females who are not pregnant should be vaccinated. If there is no evidence of immunity, females who are pregnant should delay immunization until after pregnancy.  Pneumococcal 13-valent conjugate (PCV13) vaccine.** / Consult your health care provider.  Pneumococcal polysaccharide (PPSV23) vaccine.** / 1 to 2 doses if you smoke cigarettes or if you have certain conditions.  Meningococcal vaccine.** / 1 dose if you are age 4 to 8 years and a Market researcher living in a residence hall, or have one of several medical conditions, you need to get vaccinated against meningococcal disease. You may also need additional booster doses.  Hepatitis A vaccine.** / Consult your health care provider.  Hepatitis B vaccine.** / Consult your health care provider.  Haemophilus influenzae type b (Hib) vaccine.** /  Consult your health care provider.  Ages 52 to 64years  Blood pressure check.** / Every 1 to 2 years.  Lipid and cholesterol check.** / Every 5 years beginning at age 49 years.  Lung cancer screening. / Every year if you are aged 47 80 years and have a 30-pack-year history of smoking and currently smoke or have quit within the past 15 years. Yearly screening is stopped once you have quit smoking for at least 15 years or develop a health problem that would prevent you from having lung cancer treatment.  Clinical breast exam.** / Every year after age 14 years.  BRCA-related cancer risk assessment.** / For women who have family members with a BRCA-related cancer (breast, ovarian, tubal, or peritoneal  cancers).  Mammogram.** / Every year beginning at age 67 years and continuing for as long as you are in good health. Consult with your health care provider.  Pap test.** / Every 3 years starting at age 44 years through age 11 or 69 years with a history of 3 consecutive normal Pap tests.  HPV screening.** / Every 3 years from ages 31 years through ages 65 to 20 years with a history of 3 consecutive normal Pap tests.  Fecal occult blood test (FOBT) of stool. / Every year beginning at age 15 years and continuing until age 64 years. You may not need to do this test if you get a colonoscopy every 10 years.  Flexible sigmoidoscopy or colonoscopy.** / Every 5 years for a flexible sigmoidoscopy or every 10 years for a colonoscopy beginning at age 63 years and continuing until age 73 years.  Hepatitis C blood test.** / For all people born from [birth year] through 1963 and any individual with known risks for hepatitis C.  Skin self-exam. / Monthly.  Influenza vaccine. / Every year.  Tetanus, diphtheria, and acellular pertussis (Tdap/Td) vaccine.** / Consult your health care provider. Pregnant women should receive 1 dose of Tdap vaccine during each pregnancy. 1 dose of Td every 10 years.  Varicella vaccine.** / Consult your health care provider. Pregnant females who do not have evidence of immunity should receive the first dose after pregnancy.  Zoster vaccine.** / 1 dose for adults aged 38 years or older.  Measles, mumps, rubella (MMR) vaccine.** / You need at least 1 dose of MMR if you were born in 1957 or later. You may also need a 2nd dose. For females of childbearing age, rubella immunity should be determined. If there is no evidence of immunity, females who are not pregnant should be vaccinated. If there is no evidence of immunity, females who are pregnant should delay immunization until after pregnancy.  Pneumococcal 13-valent conjugate (PCV13) vaccine.** / Consult your health care  provider.  Pneumococcal polysaccharide (PPSV23) vaccine.** / 1 to 2 doses if you smoke cigarettes or if you have certain conditions.  Meningococcal vaccine.** / Consult your health care provider.  Hepatitis A vaccine.** / Consult your health care provider.  Hepatitis B vaccine.** / Consult your health care provider.  Haemophilus influenzae type b (Hib) vaccine.** / Consult your health care provider.  Ages 75 years and over  Blood pressure check.** / Every 1 to 2 years.  Lipid and cholesterol check.** / Every 5 years beginning at age 4 years.  Lung cancer screening. / Every year if you are aged 31 80 years and have a 30-pack-year history of smoking and currently smoke or have quit within the past 15 years. Yearly screening is stopped once you have quit smoking  for at least 15 years or develop a health problem that would prevent you from having lung cancer treatment.  Clinical breast exam.** / Every year after age 91 years.  BRCA-related cancer risk assessment.** / For women who have family members with a BRCA-related cancer (breast, ovarian, tubal, or peritoneal cancers).  Mammogram.** / Every year beginning at age 5 years and continuing for as long as you are in good health. Consult with your health care provider.  Pap test.** / Every 3 years starting at age 6 years through age 38 or 13 years with 3 consecutive normal Pap tests. Testing can be stopped between 65 and 70 years with 3 consecutive normal Pap tests and no abnormal Pap or HPV tests in the past 10 years.  HPV screening.** / Every 3 years from ages 56 years through ages 53 or 43 years with a history of 3 consecutive normal Pap tests. Testing can be stopped between 65 and 70 years with 3 consecutive normal Pap tests and no abnormal Pap or HPV tests in the past 10 years.  Fecal occult blood test (FOBT) of stool. / Every year beginning at age 86 years and continuing until age 25 years. You may not need to do this test if you  get a colonoscopy every 10 years.  Flexible sigmoidoscopy or colonoscopy.** / Every 5 years for a flexible sigmoidoscopy or every 10 years for a colonoscopy beginning at age 68 years and continuing until age 65 years.  Hepatitis C blood test.** / For all people born from [birth year] through 1965 and any individual with known risks for hepatitis C.  Osteoporosis screening.** / A one-time screening for women ages 50 years and over and women at risk for fractures or osteoporosis.  Skin self-exam. / Monthly.  Influenza vaccine. / Every year.  Tetanus, diphtheria, and acellular pertussis (Tdap/Td) vaccine.** / 1 dose of Td every 10 years.  Varicella vaccine.** / Consult your health care provider.  Zoster vaccine.** / 1 dose for adults aged 59 years or older.  Pneumococcal 13-valent conjugate (PCV13) vaccine.** / Consult your health care provider.  Pneumococcal polysaccharide (PPSV23) vaccine.** / 1 dose for all adults aged 55 years and older.  Meningococcal vaccine.** / Consult your health care provider.  Hepatitis A vaccine.** / Consult your health care provider.  Hepatitis B vaccine.** / Consult your health care provider.  Haemophilus influenzae type b (Hib) vaccine.** / Consult your health care provider. ** Family history and personal history of risk and conditions may change your health care provider's recommendations. Document Released: 03/31/2001 Document Revised: 11/23/2012  [facility] Patient Information 2014 [place], [place].   EXERCISE AND DIET:  We recommended that you start or continue a regular exercise program for good health. Regular exercise means any activity that makes your heart beat faster and makes you sweat.  We recommend exercising at least 30 minutes per day at least 3 days a week, preferably 5.  We also recommend a diet low in fat and sugar / carbohydrates.  Inactivity, poor dietary choices and obesity can cause diabetes, heart attack, stroke, and kidney damage, among  others.     ALCOHOL AND SMOKING:  Women should limit their alcohol intake to no more than 7 drinks/beers/glasses of wine (combined, not each!) per week. Moderation of alcohol intake to this level decreases your risk of breast cancer and liver damage.  ( And of course, no recreational drugs are part of a healthy lifestyle.)  Also, you should not be smoking at all or even  being exposed to second hand smoke. Most people know smoking can cause cancer, and various heart and lung diseases, but did you know it also contributes to weakening of your bones?  Aging of your skin?  Yellowing of your teeth and nails?   CALCIUM AND VITAMIN D:  Adequate intake of calcium and Vitamin D are recommended.  The recommendations for exact amounts of these supplements seem to change often, but generally speaking 600 mg of calcium (either carbonate or citrate) and 800 units of Vitamin D per day seems prudent. Certain women may benefit from higher intake of Vitamin D.  If you are among these women, your doctor will have told you during your visit.     PAP SMEARS:  Pap smears, to check for cervical cancer or precancers,  have traditionally been done yearly, although recent scientific advances have shown that most women can have pap smears less often.  However, every woman still should have a physical exam from her gynecologist or primary care physician every year. It will include a breast check, inspection of the vulva and vagina to check for abnormal growths or skin changes, a visual exam of the cervix, and then an exam to evaluate the size and shape of the uterus and ovaries.  And after 51 years of age, a rectal exam is indicated to check for rectal cancers. We will also provide age appropriate advice regarding health maintenance, like when you should have certain vaccines, screening for sexually transmitted diseases, bone density testing, colonoscopy, mammograms, etc.    MAMMOGRAMS:  All women over 56 years old should have  a yearly mammogram. Many facilities now offer a "3D" mammogram, which may cost around $50 extra out of pocket. If possible,  we recommend you accept the option to have the 3D mammogram performed.  It both reduces the number of women who will be called back for extra views which then turn out to be normal, and it is better than the routine mammogram at detecting truly abnormal areas.     COLONOSCOPY:  Colonoscopy to screen for colon cancer is recommended for all women at age 41.  We know, you hate the idea of the prep.  We agree, BUT, having colon cancer and not knowing it is worse!!  Colon cancer so often starts as a polyp that can be seen and removed at colonscopy, which can quite literally save your life!  And if your first colonoscopy is normal and you have no family history of colon cancer, most women don't have to have it again for 10 years.  Once every ten years, you can do something that may end up saving your life, right?  We will be happy to help you get it scheduled when you are ready.  Be sure to check your insurance coverage so you understand how much it will cost.  It may be covered as a preventative service at no cost, but you should check your particular policy.

## 2018-12-05 NOTE — Addendum Note (Signed)
Addended by: Drucilla Schmidt on: 12/05/2018 09:02 AM   Modules accepted: Orders

## 2018-12-05 NOTE — Progress Notes (Addendum)
Impression and Recommendations:    1. Encounter for general adult medical examination with abnormal findings   2. Encounter for health maintenance examination   3. Hyperlipidemia, unspecified hyperlipidemia type   4. GAD (generalized anxiety disorder)   5. Screening for multiple conditions   6. Screening for thyroid disorder   7. Screening for diabetes mellitus   8. Screening for iron deficiency anemia   9. Need for immunization against influenza   10. Need for shingles vaccine     1) Anticipatory Guidance: Discussed importance of wearing a seatbelt while driving, not texting while driving; sunscreen when outside along with yearly skin surveillance; eating a well balanced and modest diet; physical activity at least 25 minutes per day or 150 min/ week of moderate to intense activity.  - Discussed using a mixture of half hydrogen peroxide, half rubbing alcohol to clean out ears.  - Reviewed prudent skin screening habits with patient today.  - For prevention of constipation, discussed prudent use of Miralax.  2) Immunizations / Screenings / Labs:   All immunizations and screenings that patient agrees to, are up-to-date per recommendations or will be updated today.  Patient understands the needs for q 34modental and yearly vision screens which pt will schedule independently. Obtain CBC, CMP, HgA1c, Lipid panel, TSH and vit D when fasting if not already done recently.   - Full fasting labs drawn today.  See orders.  - Up to date with pap smear and mammogram.  - Colonoscopy UTD.  - TDAP is UTD.  - Need for shingles vaccination. - Extensive education provided and all questions answered. - Per patient, wishes to receive first shingles vaccination today.  Repeat 2 months.  - Need for influenza vaccination. - Extensive education provided and all questions answered.  3) Weight:   Discussed goal of losing even 5-10% of current body weight which would improve overall feelings of  well being and improve objective health data significantly.   Improve nutrient density of diet through increasing intake of fruits and vegetables and decreasing saturated/trans fats, white flour products and refined sugar products.   Explained to patient what BMI refers to, and what it means medically.    Told patient to think about it as a "medical risk stratification measurement" and how increasing BMI is associated with increasing risk/ or worsening state of various diseases such as hypertension, hyperlipidemia, diabetes, premature OA, depression etc.  American Heart Association guidelines for healthy diet, basically Mediterranean diet, and exercise guidelines of 30 minutes 5 days per week or more discussed in detail.  - Strongly recommended several options to assist with weight loss PRN. - Discussed referral to [facility] in future if desired.  4) [place] to continue working toward exercising to improve overall mental, physical, and emotional health.    - Reviewed the "spokes of the wheel" of mood and health management.  Stressed the importance of ongoing prudent habits, including regular exercise, appropriate sleep hygiene, healthful dietary habits, and prayer/meditation to relax.  - Encouraged patient to engage in daily physical activity, especially a formal exercise routine.  Recommended that the patient eventually strive for at least 150 minutes of moderate cardiovascular activity per week according to guidelines established by the [facility]   - Healthy dietary habits encouraged, including low-carb, and high amounts of lean protein in diet.   - Patient should also consume adequate amounts of water.  - Health counseling performed.  All questions  answered.   Orders Placed This Encounter  Procedures  . Flu Vaccine QUAD 36+ mos IM  . Varicella-zoster vaccine IM (Shingrix)  . CBC with Differential/Platelet  .  Comprehensive metabolic panel  . Hemoglobin A1c  . Lipid panel  . T3  . T4, free  . TSH  . VITAMIN D 25 Hydroxy (Vit-D Deficiency, Fractures)    Return in about 6 months (around 06/05/2019), or for routine chronic care, for AND f/up near future to address additional concerns re: chronic /acute medical concerns .   Gross side effects, risk and benefits, and alternatives of medications discussed with patient.  Patient is aware that all medications have potential side effects and we are unable to predict every side effect or drug-drug interaction that may occur.  Expresses verbal understanding and consents to current therapy plan and treatment regimen.  F-up preventative CPE in 1 year- reminded pt again, this is in addition to any chronic care visits.    Please see orders placed and AVS handed out to patient at the end of our visit for further patient instructions/ counseling done pertaining to today's office visit.  This document serves as a record of services personally performed by Mellody Dance, DO. It was created on her behalf by Toni Amend, a trained medical scribe. The creation of this record is based on the scribe's personal observations and the provider's statements to them.   I have reviewed the above medical documentation for accuracy and completeness and I concur.  Mellody Dance, DO 12/05/2018 8:53 AM        Subjective:    Chief Complaint  Patient presents with  . Annual Exam   CC:   HPI: Angela Ramsey is a 53 y.o. female who presents to [place] [facility] today a yearly health maintenance exam.  Health Maintenance Summary Reviewed and updated, unless pt declines services.  Colonoscopy:  Up to date.  Denies family history of colon cancer. Tobacco History Reviewed:  Never smoker. Alcohol:  No concerns, no excessive use. Exercise Habits:  Not meeting AHA guidelines. STD concerns:  None. Drug Use:   None. Birth control  method:  Post-menopausal. Menses regular:   Post-menopausal. Lumps or breast concerns:  None reported. Breast Cancer Family History:  Grandma and aunt, both on mom's side, had breast cancer. Denies first-degree relatives with breast cancer.  Notes she's getting an MRI in near future.  Feels her family is doing well overall. Notes she uses several methods to cope with stress including crochet and reading, but exercises very little.  Denies GI concerns but states sometimes she has constipation.  - Urology Follow-up for Pelvic Floor Going to Urology for pelvic floor. Seeing PT who told her to drink more water. Notes she's drinking more water and trying to stay more hydrated.  - Female Health She is up to date with pap smears; has another coming up in November. She is up to date with her mammogram.  Notes has some trouble staying asleep, but states "that could be menopausal."  She hasn't had a period in two years.  Follows up with Dr. Phineas Real of OBGYN.  Notes her OBGYN is retiring at the end of the year, but "has a guy coming in."  - [place] it's been a little bit since her last eye exam.  - Dental Health Notes recently went to the dentist; goes every six months.  - Dermatological Health Says she wears sunscreen when she's out and about, but  hasn't been out much this year.  Had to go see dermatology recently; [facility].  Notes had some abnormal skin burned/removed/taken off that "could develop into melanoma."    Immunization History  Administered Date(s) Administered  . Influenza,inj,Quad PF,6+ Mos 12/30/2015, 12/05/2018  . Tdap 12/30/2015  . Zoster Recombinat (Shingrix) 12/05/2018    Health Maintenance  Topic Date Due  . MAMMOGRAM  03/22/2019  . PAP SMEAR-Modifier  12/30/2020  . COLONOSCOPY  07/27/2021  . TETANUS/TDAP  12/29/2025  . INFLUENZA VACCINE  Completed  . HIV Screening  Completed     Wt Readings from Last 3 Encounters:  12/05/18 203  lb (92.1 kg)  07/28/18 192 lb (87.1 kg)  07/21/18 192 lb (87.1 kg)   BP Readings from Last 3 Encounters:  12/05/18 112/75  08/24/18 118/78  08/10/18 130/82   Pulse Readings from Last 3 Encounters:  12/05/18 64  07/28/18 71  04/26/18 73     Past Medical History:  Diagnosis Date  . Anxiety   . Endometriosis   . Heart murmur    as a child  . 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 BREAST BIOPSY Left 2016  . BREAST SURGERY     Breast Bx benign  . FOOT SURGERY Left    plantar fasciitis  . NOVASURE ABLATION  10/2004  . PELVIC LAPAROSCOPY  12/2002   endometriosis      Family History  Problem Relation Age of Onset  . Hypertension Father   . Hyperlipidemia Father   . Stroke Father   . Breast cancer Maternal Aunt 67       mother's identical twin  . Cancer Maternal Aunt        breast  . Cancer Maternal Grandfather        lung  . Diabetes Paternal Grandmother   . Breast cancer Maternal Grandmother        dx in her late 57s to early 32s  . Cancer Maternal Grandmother        breast  . Breast cancer Maternal Aunt 49       second cancer at  37  . Cancer Maternal Aunt        breast x 2  . Leukemia Paternal Grandfather   . Cancer Paternal Grandfather        leukemia  . Hypertension Mother   . Thyroid cancer Sister        dx in her early to mid 52s  . Cancer Sister        thyroid  . Breast cancer Paternal Aunt 86  . Colon cancer Neg Hx   . Esophageal cancer Neg Hx   . Rectal cancer Neg Hx   . Stomach cancer Neg Hx       Social History   Substance and Sexual Activity  Drug Use No  ,   Social History   Substance and Sexual Activity  Alcohol Use Yes  . Alcohol/week: 1.0 standard drinks  . Types: 1 Glasses of wine per week  ,   Social History   Tobacco Use  Smoking Status Never Smoker   Smokeless Tobacco Never Used  ,   Social History   Substance and Sexual Activity  Sexual Activity Yes  . Birth control/protection: Surgical   Comment: 1st intercourse 20 yo-5 partners-Vasectomy    Current  Outpatient Medications on File Prior to Visit  Medication Sig Dispense Refill  . FLUoxetine (PROZAC) 20 MG capsule Take 1 capsule (20 mg total) by mouth daily. 30 capsule 11   No current facility-administered medications on file prior to visit.     Allergies: Patient has no known allergies.  Review of Systems: General:   Denies fever, chills, unexplained weight loss.  Optho/Auditory:   Denies visual changes, blurred vision/LOV Respiratory:   Denies SOB, DOE more than baseline levels.   Cardiovascular:   Denies chest pain, palpitations, new onset peripheral edema  Gastrointestinal:   Denies nausea, vomiting, diarrhea.  Genitourinary: Denies dysuria, freq/ urgency, flank pain or discharge from genitals.  Endocrine:     Denies hot or cold intolerance, polyuria, polydipsia. Musculoskeletal:   Denies unexplained myalgias, joint swelling, unexplained arthralgias, gait problems.  Skin:  Denies rash, suspicious lesions Neurological:     Denies dizziness, unexplained weakness, numbness  Psychiatric/Behavioral:   Denies mood changes, suicidal or homicidal ideations, hallucinations    Objective:    Blood pressure 112/75, pulse 64, temperature 98.4 F (36.9 C), resp. rate 16, height 5' (1.524 m), weight 203 lb (92.1 kg), last menstrual period 12/25/2015, SpO2 99 %. Body mass index is 39.65 kg/m. General Appearance:    Alert, cooperative, no distress, appears stated age  Head:    Normocephalic, without obvious abnormality, atraumatic  Eyes:    PERRL, conjunctiva/corneas clear, EOM's intact, fundi    benign, both eyes  Ears:    Normal TM's and external ear canals, both ears  Nose:   Nares normal, septum midline, mucosa normal, no drainage    or sinus tenderness  Throat:   Lips w/o  lesion, mucosa moist, and tongue normal; teeth and   gums normal  Neck:   Supple, symmetrical, trachea midline, no adenopathy;    thyroid:  no enlargement/tenderness/nodules; no carotid   bruit or JVD  Back:     Symmetric, no curvature, ROM normal, no CVA tenderness  Lungs:     Clear to auscultation bilaterally, respirations unlabored, no       Wh/ R/ R  Chest Wall:    No tenderness or gross deformity; normal excursion   Heart:    Regular rate and rhythm, S1 and S2 normal, no murmur, rub   or gallop  Breast Exam:    Deferred to OBGYN.  Abdomen:     Soft, non-tender, bowel sounds active all four quadrants, NO   G/R/R, no masses, no organomegaly  Genitalia:    Deferred to OBGYN.  Rectal:    Deferred to OBGYN.  Extremities:   Extremities normal, atraumatic, no cyanosis or gross edema  Pulses:   2+ and symmetric all extremities  Skin:   Warm, dry, Skin color, texture, turgor normal, no obvious rashes or lesions Psych: No HI/SI, judgement and insight good, Euthymic mood. Full Affect.  Neurologic:   CNII-XII intact, normal strength, sensation and reflexes    Throughout

## 2018-12-06 ENCOUNTER — Other Ambulatory Visit: Payer: Self-pay | Admitting: Family Medicine

## 2018-12-06 DIAGNOSIS — E559 Vitamin D deficiency, unspecified: Secondary | ICD-10-CM

## 2018-12-06 LAB — COMPREHENSIVE METABOLIC PANEL
ALT: 12 IU/L (ref 0–32)
AST: 15 IU/L (ref 0–40)
Albumin/Globulin Ratio: 1.6 (ref 1.2–2.2)
Albumin: 4.3 g/dL (ref 3.8–4.9)
Alkaline Phosphatase: 117 IU/L (ref 39–117)
BUN/Creatinine Ratio: 21 (ref 9–23)
BUN: 16 mg/dL (ref 6–24)
Bilirubin Total: 0.4 mg/dL (ref 0.0–1.2)
CO2: 28 mmol/L (ref 20–29)
Calcium: 9.5 mg/dL (ref 8.7–10.2)
Chloride: 98 mmol/L (ref 96–106)
Creatinine, Ser: 0.78 mg/dL (ref 0.57–1.00)
GFR calc Af Amer: 102 mL/min/{1.73_m2} (ref >59)
GFR calc non Af Amer: 88 mL/min/{1.73_m2} (ref >59)
Globulin, Total: 2.7 g/dL (ref 1.5–4.5)
Glucose: 85 mg/dL (ref 65–99)
Potassium: 4.4 mmol/L (ref 3.5–5.2)
Sodium: 138 mmol/L (ref 134–144)
Total Protein: 7 g/dL (ref 6.0–8.5)

## 2018-12-06 LAB — CBC WITH DIFFERENTIAL/PLATELET
Basophils Absolute: 0.1 10*3/uL (ref 0.0–0.2)
Basos: 1 %
EOS (ABSOLUTE): 1.3 10*3/uL — ABNORMAL HIGH (ref 0.0–0.4)
Eos: 12 %
Hematocrit: 42.3 % (ref 34.0–46.6)
Hemoglobin: 14.1 g/dL (ref 11.1–15.9)
Immature Grans (Abs): 0 10*3/uL (ref 0.0–0.1)
Immature Granulocytes: 0 %
Lymphocytes Absolute: 3.5 10*3/uL — ABNORMAL HIGH (ref 0.7–3.1)
Lymphs: 31 %
MCH: 29.3 pg (ref 26.6–33.0)
MCHC: 33.3 g/dL (ref 31.5–35.7)
MCV: 88 fL (ref 79–97)
Monocytes Absolute: 0.5 10*3/uL (ref 0.1–0.9)
Monocytes: 4 %
Neutrophils Absolute: 5.9 10*3/uL (ref 1.4–7.0)
Neutrophils: 52 %
Platelets: 331 10*3/uL (ref 150–450)
RBC: 4.81 x10E6/uL (ref 3.77–5.28)
RDW: 12.5 % (ref 11.7–15.4)
WBC: 11.3 10*3/uL — ABNORMAL HIGH (ref 3.4–10.8)

## 2018-12-06 LAB — T4, FREE: Free T4: 1.28 ng/dL (ref 0.82–1.77)

## 2018-12-06 LAB — LIPID PANEL
Chol/HDL Ratio: 3.4 ratio (ref 0.0–4.4)
Cholesterol, Total: 219 mg/dL — ABNORMAL HIGH (ref 100–199)
HDL: 64 mg/dL (ref >39)
LDL Chol Calc (NIH): 141 mg/dL — ABNORMAL HIGH (ref 0–99)
Triglycerides: 82 mg/dL (ref 0–149)
VLDL Cholesterol Cal: 14 mg/dL (ref 5–40)

## 2018-12-06 LAB — VITAMIN D 25 HYDROXY (VIT D DEFICIENCY, FRACTURES): Vit D, 25-Hydroxy: 30.4 ng/mL (ref 30.0–100.0)

## 2018-12-06 LAB — HEMOGLOBIN A1C
Est. average glucose Bld gHb Est-mCnc: 103 mg/dL
Hgb A1c MFr Bld: 5.2 % (ref 4.8–5.6)

## 2018-12-06 LAB — TSH: TSH: 2.83 u[IU]/mL (ref 0.450–4.500)

## 2018-12-06 LAB — T3: T3, Total: 121 ng/dL (ref 71–180)

## 2018-12-06 MED ORDER — VITAMIN D 50 MCG (2000 UT) PO TABS: 2000.0000 [IU] | ORAL_TABLET | Freq: Every day | ORAL | Status: AC

## 2018-12-08 ENCOUNTER — Encounter: Payer: Self-pay | Admitting: Family Medicine

## 2018-12-09 ENCOUNTER — Other Ambulatory Visit: Payer: Self-pay

## 2018-12-12 ENCOUNTER — Other Ambulatory Visit: Payer: Self-pay | Admitting: Gynecology

## 2018-12-12 ENCOUNTER — Telehealth: Payer: Self-pay | Admitting: Family Medicine

## 2018-12-12 ENCOUNTER — Encounter: Payer: Self-pay | Admitting: Gynecology

## 2018-12-12 ENCOUNTER — Ambulatory Visit (INDEPENDENT_AMBULATORY_CARE_PROVIDER_SITE_OTHER): Payer: 59

## 2018-12-12 ENCOUNTER — Other Ambulatory Visit: Payer: Self-pay

## 2018-12-12 ENCOUNTER — Ambulatory Visit (INDEPENDENT_AMBULATORY_CARE_PROVIDER_SITE_OTHER): Payer: 59 | Admitting: Gynecology

## 2018-12-12 VITALS — BP 122/78

## 2018-12-12 DIAGNOSIS — N926 Irregular menstruation, unspecified: Secondary | ICD-10-CM | POA: Diagnosis not present

## 2018-12-12 DIAGNOSIS — N882 Stricture and stenosis of cervix uteri: Secondary | ICD-10-CM

## 2018-12-12 DIAGNOSIS — N939 Abnormal uterine and vaginal bleeding, unspecified: Secondary | ICD-10-CM | POA: Diagnosis not present

## 2018-12-12 DIAGNOSIS — N86 Erosion and ectropion of cervix uteri: Secondary | ICD-10-CM

## 2018-12-12 DIAGNOSIS — Z124 Encounter for screening for malignant neoplasm of cervix: Secondary | ICD-10-CM | POA: Diagnosis not present

## 2018-12-12 MED ORDER — FLUCONAZOLE 150 MG PO TABS: 150.0000 mg | ORAL_TABLET | ORAL | 0 refills | Status: DC

## 2018-12-12 MED ORDER — CLINDAMYCIN PHOSPHATE 2 % VA CREA: 1.0000 | TOPICAL_CREAM | Freq: Every day | VAGINAL | 0 refills | Status: DC

## 2018-12-12 NOTE — Patient Instructions (Signed)
Follow-up if bleeding continues

## 2018-12-12 NOTE — Addendum Note (Signed)
Addended by: Nelva Nay on: 12/12/2018 11:00 AM   Modules accepted: Orders

## 2018-12-12 NOTE — Telephone Encounter (Signed)
Patient states that at last OV she had a biometric screening form to be completed. She called today to check status of this item and I advised her I did not see it upfront nor scanned in the system. I told her I would send a message to clinic staff to see if this item is still pending to be filled out. Please contact patient with update or let front staff know where it is in processing to communicate it to the patient.

## 2018-12-12 NOTE — Progress Notes (Signed)
    Angela Ramsey Oct 23, 1965 [id number]        53 y.o.  [id number] presents for ultrasound due to postmenopausal bleeding.  Has been having some bleeding over the last month or so on and off.  Usually after intercourse but has had a few spontaneous episodes.  Angela Ramsey was 48 in June 2020.  Pap smear/HPV -2019.  Having some cramping.  Past medical history,surgical history, problem list, medications, allergies, family history and social history were all reviewed and documented in the EPIC chart.  Directed ROS with pertinent positives and negatives documented in the history of present illness/assessment and plan.  Exam: Angela Ramsey assistant Vitals:   12/12/18 1030  BP: 122/78   General appearance:  Normal Abdomen soft nontender without mass guarding rebound Pelvic external BUS vagina normal.  Cervix high in the vaginal vault.  Friable to wiping with a proctoscopy swab easily bleeding.  Uterus difficult to palpate but no gross masses or tenderness.  Adnexa without gross masses  Ultrasound transvaginal shows uterus normal size and echotexture.  Endometrial echo irregular consistent with her history of ablation.  No thickening or fluid collections noted.  Endometrial echo measured 3.44 mm.  Right and left ovaries small and atrophic.  Cul-de-sac negative  Sonohysterogram attempted, sterile technique, single-tooth tenaculum anterior lip stabilization with attempted sounding unsuccessful to the level of the internal cervical os.  Unable to negotiate past this.  Pap smear was done.  Assessment/Plan:  53 y.o. [id number] with new onset of bleeding status post ablation in the past.  Elevated FSH earlier this year.  Cervix friable which may be the source of her bleeding.  We discussed this possibility and I am going to cover her with Diflucan 150 mg every other day x3 doses and Cleocin vaginal cream nightly x7 nights.  We discussed the issue of endometrial assessment and how best to accomplish this.  Issues of  missed pathology to include uterine cancer discussed.  Risks of attempting more aggressive dilating/sounding to include perforation with damage to internal organs also reviewed.  Ultimately possible hysterectomy if bleeding continues also discussed.  Will monitor for now and follow-up if bleeding continues.  She will follow-up for Pap smear results.    Angela Auerbach MD, 10:43 AM 12/12/2018

## 2018-12-12 NOTE — Telephone Encounter (Signed)
Please call pt to inform her that form is completed and at front desk for pick up.  Charyl Bigger, CMA

## 2018-12-13 LAB — PAP IG W/ RFLX HPV ASCU

## 2019-01-02 ENCOUNTER — Other Ambulatory Visit: Payer: Self-pay

## 2019-01-03 ENCOUNTER — Ambulatory Visit (INDEPENDENT_AMBULATORY_CARE_PROVIDER_SITE_OTHER): Payer: 59 | Admitting: Gynecology

## 2019-01-03 ENCOUNTER — Encounter: Payer: Self-pay | Admitting: Gynecology

## 2019-01-03 VITALS — BP 126/82 | Ht 60.0 in | Wt 202.0 lb

## 2019-01-03 DIAGNOSIS — N926 Irregular menstruation, unspecified: Secondary | ICD-10-CM

## 2019-01-03 DIAGNOSIS — Z01419 Encounter for gynecological examination (general) (routine) without abnormal findings: Secondary | ICD-10-CM | POA: Diagnosis not present

## 2019-01-03 MED ORDER — MEDROXYPROGESTERONE ACETATE 10 MG PO TABS: 10.0000 mg | ORAL_TABLET | Freq: Every day | ORAL | 14 refills | Status: DC

## 2019-01-03 MED ORDER — FLUOXETINE HCL 20 MG PO CAPS: 20.0000 mg | ORAL_CAPSULE | Freq: Every day | ORAL | 4 refills | Status: DC

## 2019-01-03 NOTE — Progress Notes (Signed)
    Coren Crownover 1966-01-20 [id number]        53 y.o.  [id number] for annual gynecologic exam.  Recent evaluated for spotting.  [place] 88 in June 2020.  Sonohysterogram attempted but unable to negotiate endometrial canal due to past history of ablation.  Endometrial echo was irregular but measured 3.4 mm.  No thickening or fluid connections were noted.  Right and left ovaries were normal/atrophic in appearance.  Past medical history,surgical history, problem list, medications, allergies, family history and social history were all reviewed and documented as reviewed in the EPIC chart.  ROS:  Performed with pertinent positives and negatives included in the history, assessment and plan.   Additional significant findings : None   Exam: [name] assistant Vitals:   01/03/19 1003  BP: 126/82  Weight: 202 lb (91.6 kg)  Height: 5' (1.524 m)   Body mass index is 39.45 kg/m.  General appearance:  Normal affect, orientation and appearance. Skin: Grossly normal HEENT: Without gross lesions.  No cervical or supraclavicular adenopathy. Thyroid normal.  Lungs:  Clear without wheezing, rales or rhonchi Cardiac: RR, without RMG Abdominal:  Soft, nontender, without masses, guarding, rebound, organomegaly or hernia Breasts:  Examined lying and sitting without masses, retractions, discharge or axillary adenopathy. Pelvic:  Ext, BUS, Vagina: Normal  Cervix: Normal.  Pap smear done  Uterus: Anteverted, normal size, shape and contour, midline and mobile nontender   Adnexa: Without masses or tenderness    Anus and perineum: Normal   Rectovaginal: Normal sphincter tone without palpated masses or tenderness.    Assessment/Plan:  53 y.o. G47P2002 female for annual gynecologic exam.  Status post endometrial ablation in the past.  Vasectomy birth control  1. Recent intermittent spotting usually following intercourse.  Treated with a trial of Cleocin vaginal cream.  Recent Pap smear was insufficient.   Pap smear repeated today.  Endometrial cavity unable to evaluate due to ablation history.  Recommend short course of progesterone Provera 10 mg x 14 days.  If bleeding continues options to include attempted hysteroscopy D&C up to and including hysterectomy.  Will readdress if bleeding continues. 2. History of ATM mutation of unknown significance.  Calculated breast cancer risk 18 to 23.9%.  Mammography 08/2018.  MRI 01/2018.  She would like to continue with annual MRI screening along with her mammography.  Will schedule for her in December.  Breast exam normal today.  Continue with annual mammography when due this summer. 3. Pap smear/HPV 2019.  Repeat Pap smear done today due to her spotting history. 4. Anxiety.  On fluoxetine 20 mg daily doing well with this and would like to continue.  Refill x1 year provided. 5. DEXA never.  Will plan further into the menopause. 6. Colonoscopy 2020.  Repeat at their recommended interval. 7. Health maintenance.  Recent labs through her primary provider's office.  No blood work done today.  Follow-up in 1 year, sooner if bleeding continues.   [name] MD, 10:29 AM 01/03/2019

## 2019-01-03 NOTE — Addendum Note (Signed)
Addended by: Nelva Nay on: 01/03/2019 10:50 AM   Modules accepted: Orders

## 2019-01-03 NOTE — Patient Instructions (Signed)
Take the prescribed progesterone for 2 weeks.  Follow-up if bleeding continues.  Office will call to arrange for the MRI screening of the breasts.  Follow-up in 1 year for annual exam.

## 2019-01-05 ENCOUNTER — Telehealth: Payer: Self-pay | Admitting: *Deleted

## 2019-01-05 DIAGNOSIS — Z803 Family history of malignant neoplasm of breast: Secondary | ICD-10-CM

## 2019-01-05 LAB — PAP IG W/ RFLX HPV ASCU

## 2019-01-05 NOTE — Telephone Encounter (Signed)
Patient scheduled on 01/25/19 for MRI

## 2019-01-05 NOTE — Telephone Encounter (Signed)
-----   Message from [name], MD sent at 01/03/2019 10:26 AM EST ----- Arrange for breast MRI.  Calculated risk of breast cancer exceeds 20%

## 2019-01-05 NOTE — Telephone Encounter (Signed)
I called [employer] and was referred to all [employer] for PA.  I typed in patient's info and the CPT code and was told by recording that PA is not required by patient's plan. Was not required in 2019 either.

## 2019-01-05 NOTE — Telephone Encounter (Signed)
Order placed at [facility] for MRI breast,number given to patient to call and schedule.

## 2019-01-13 ENCOUNTER — Encounter: Payer: Self-pay | Admitting: Gynecology

## 2019-01-17 ENCOUNTER — Encounter: Payer: Self-pay | Admitting: Gynecology

## 2019-01-18 NOTE — Telephone Encounter (Signed)
As the bleeding does not seem heavy or overly bothersome I would monitor it through the holidays and make an appointment to see Dr. Delilah Shan (Dr Dellis Filbert if patient feels earlier appointment needed beginning of January).  The decision will be whether to attempt hysteroscopy D&C or possibly proceed with hysterectomy.  At this point I am not sure her ongoing hormonal manipulation warranted.  If bleeding would totally resolve then obviously option for observation is possible.  She is in the perimenopausal timeframe and this may be all related to that and resolve on its own.  As I am retiring and no longer doing surgery I would prefer her to see one of the other doctors as a surgical decision may be made.  If the bleeding does resolve I would have her follow-up regardless in January with one of the other doctors just to touch bases.

## 2019-01-25 ENCOUNTER — Ambulatory Visit
Admission: RE | Admit: 2019-01-25 | Discharge: 2019-01-25 | Disposition: A | Discharge status: Home / Self Care | Payer: 59 | Source: Ambulatory Visit | Attending: Gynecology | Admitting: Gynecology

## 2019-01-25 DIAGNOSIS — Z803 Family history of malignant neoplasm of breast: Secondary | ICD-10-CM

## 2019-01-25 MED ORDER — GADOBUTROL 1 MMOL/ML IV SOLN
10.0000 mL | Freq: Once | INTRAVENOUS | Status: AC | PRN
  Administered 2019-01-25: 10 mL via INTRAVENOUS

## 2019-01-27 ENCOUNTER — Encounter: Payer: Self-pay | Admitting: Gynecology

## 2019-01-30 ENCOUNTER — Other Ambulatory Visit: Payer: 59

## 2019-02-02 ENCOUNTER — Other Ambulatory Visit: Payer: Self-pay

## 2019-02-02 ENCOUNTER — Ambulatory Visit: Payer: 59

## 2019-02-03 ENCOUNTER — Encounter: Payer: Self-pay | Admitting: Gynecology

## 2019-02-03 ENCOUNTER — Ambulatory Visit: Payer: 59

## 2019-02-15 ENCOUNTER — Ambulatory Visit: Payer: 59

## 2019-02-20 ENCOUNTER — Other Ambulatory Visit: Payer: Self-pay | Admitting: Gynecology

## 2019-02-20 ENCOUNTER — Other Ambulatory Visit: Payer: Self-pay | Admitting: Family Medicine

## 2019-02-20 DIAGNOSIS — Z1231 Encounter for screening mammogram for malignant neoplasm of breast: Secondary | ICD-10-CM

## 2019-02-23 ENCOUNTER — Other Ambulatory Visit: Payer: Self-pay

## 2019-02-24 ENCOUNTER — Ambulatory Visit (INDEPENDENT_AMBULATORY_CARE_PROVIDER_SITE_OTHER): Payer: 59 | Admitting: Obstetrics & Gynecology

## 2019-02-24 VITALS — BP 120/76

## 2019-02-24 DIAGNOSIS — Z9889 Other specified postprocedural states: Secondary | ICD-10-CM | POA: Diagnosis not present

## 2019-02-24 DIAGNOSIS — N950 Postmenopausal bleeding: Secondary | ICD-10-CM | POA: Diagnosis not present

## 2019-02-24 NOTE — Progress Notes (Signed)
    Angela Ramsey 1965/12/14 [id number]        54 y.o.  G2P2002   RP: Persistent PMB  HPI: Has been having some bleeding since 10/2018 on and off.  Usually after intercourse but has had a few spontaneous episodes.  [place] was 89 in June 2020.  No HRT.  H/O Endometrial Ablation.  Having some cramping.  Pelvic US with attempted [facility] 12/12/2018.  Pap negative 12/2018.   OB History  Gravida Para Term Preterm AB Living  2 2 2     2   SAB TAB Ectopic Multiple Live Births               # Outcome Date GA Lbr Len/2nd Weight Sex Delivery Anes PTL Lv  2 Term           1 Term             Past medical history,surgical history, problem list, medications, allergies, family history and social history were all reviewed and documented in the EPIC chart.   Directed ROS with pertinent positives and negatives documented in the history of present illness/assessment and plan.  Exam:  Vitals:   02/24/19 1358  BP: 120/76   General appearance:  Normal  Abdomen: Normal  Gynecologic exam: Silver Nitrate on the Exocervix.  Pelvic US/Attempted Sonohysto 12/12/2018: Ultrasound transvaginal shows uterus normal size and echotexture. Endometrial echo irregular consistent with her history of ablation. No thickening or fluid collections noted. Endometrial echo measured 3.44 mm. Right and left ovaries small and atrophic. Cul-de-sac negative Sonohysterogram attempted, sterile technique, single-tooth tenaculum anterior lip stabilization with attempted sounding unsuccessful to the level of the internal cervical os. Unable to negotiate past this. Pap smear was done.  Pap reflex 12/12/2018 Unsatisfactory.  Pap reflex 01/03/2019 Satisfactory, no TZ cells, Negative.   Assessment/Plan:  54 y.o. G2P2002   1. Postmenopausal bleeding Persistent mild postmenopausal bleeding occurring mostly after intercourse but not strictly.  Will reconfirm menopause with an [facility] today.  We will repeat a pelvic  ultrasound to make a final decision on management.  Silver Nitrate applied on the exocervix today in case that area is the source of post intercourse spotting.  If postmenopausal bleeding persists, may have to proceed with a hysterectomy given the impossibility of sampling the intra uterine cavity to rule out an endometrial pathology such as endometrial hyperplasia or cancer. - FSH - US Transvaginal Non-OB; Future  2. Status post endometrial ablation SonoHysterogram attempted without success probably due to the endometrial ablation.  Counseling on above issues and coordination of care more than 50% for 25 minutes.  Princess Bruins MD, 2:31 PM 02/24/2019

## 2019-02-25 LAB — FOLLICLE STIMULATING HORMONE: FSH: 85.6 m[IU]/mL

## 2019-02-26 ENCOUNTER — Encounter: Payer: Self-pay | Admitting: Obstetrics & Gynecology

## 2019-02-26 NOTE — Patient Instructions (Signed)
1. Postmenopausal bleeding Persistent mild postmenopausal bleeding occurring mostly after intercourse but not strictly.  Will reconfirm menopause with an [facility] today.  We will repeat a pelvic ultrasound to make a final decision on management.  Silver Nitrate applied on the exocervix today in case that area is the source of post intercourse spotting.  If postmenopausal bleeding persists, may have to proceed with a hysterectomy given the impossibility of sampling the intra uterine cavity to rule out an endometrial pathology such as endometrial hyperplasia or cancer. - FSH - US Transvaginal Non-OB; Future  2. Status post endometrial ablation SonoHysterogram attempted without success probably due to the endometrial ablation.  Kimoni, it was a pleasure seeing you today!  I will inform you of your results as soon as they are available.

## 2019-03-03 ENCOUNTER — Ambulatory Visit (INDEPENDENT_AMBULATORY_CARE_PROVIDER_SITE_OTHER): Payer: 59

## 2019-03-03 ENCOUNTER — Other Ambulatory Visit: Payer: Self-pay

## 2019-03-03 DIAGNOSIS — Z23 Encounter for immunization: Secondary | ICD-10-CM | POA: Diagnosis not present

## 2019-03-03 NOTE — Progress Notes (Signed)
Pt here for Shingrix #2 vaccine.  Screening questionnaire reviewed, VIS provided to patient, and any/all patient questions answered.  Charyl Bigger, CMA

## 2019-03-15 ENCOUNTER — Other Ambulatory Visit: Payer: Self-pay

## 2019-03-16 ENCOUNTER — Encounter: Payer: Self-pay | Admitting: Obstetrics & Gynecology

## 2019-03-16 ENCOUNTER — Ambulatory Visit (INDEPENDENT_AMBULATORY_CARE_PROVIDER_SITE_OTHER): Payer: 59 | Admitting: Obstetrics & Gynecology

## 2019-03-16 ENCOUNTER — Ambulatory Visit (INDEPENDENT_AMBULATORY_CARE_PROVIDER_SITE_OTHER): Payer: 59

## 2019-03-16 VITALS — BP 128/80

## 2019-03-16 DIAGNOSIS — Z9889 Other specified postprocedural states: Secondary | ICD-10-CM

## 2019-03-16 DIAGNOSIS — N950 Postmenopausal bleeding: Secondary | ICD-10-CM

## 2019-03-16 NOTE — Progress Notes (Signed)
    Angela Ramsey [birth year] [id number]        54 y.o.  [id number] Married  RP: PMB for Pelvic US  HPI: S/P Endometrial Ablation.  Applied Silver Nitrate on Cervix last visit on 02/24/2019.  Minor spotting only post-coital.  No pelvic pain.  On 02/24/2019 we noted: Has been having some bleeding since 10/2018 on and off. Usually after intercourse but has had a few spontaneous episodes. [place] was 31 in June 2020.  No HRT. Having some cramping.  Pelvic US with attempted [facility] 12/12/2018.  Pap negative 12/2018.   OB History  Gravida Para Term Preterm AB Living  2 2 2     2   SAB TAB Ectopic Multiple Live Births               # Outcome Date GA Lbr Len/2nd Weight Sex Delivery Anes PTL Lv  2 Term           1 Term             Past medical history,surgical history, problem list, medications, allergies, family history and social history were all reviewed and documented in the EPIC chart.   Directed ROS with pertinent positives and negatives documented in the history of present illness/assessment and plan.  Exam:  Vitals:   03/16/19 1012  BP: 128/80   General appearance:  Normal  Pelvic US today: T/V images.  Anteverted uterus normal in size and shape with no myometrial mass measuring 6.52 x 4.41 x 3.54 cm.  Slightly irregular endometrial lining status post ablation measured at 2.8 mm which is slightly thinner than on previous scan October 2020.  No mass or thickening or abnormal blood flow at the level of the endometrium.  Both ovaries are small with atrophic appearance.  No adnexal mass.  No free fluid in the posterior to the sac.   Assessment/Plan:  54 y.o. G2P2002   1. Postmenopausal bleeding Pelvic ultrasound findings reviewed thoroughly with patient.  Patient reassured that the endometrial lining is even thinner today at 2.8 mm.  Uterus and ovaries also normal.  Patient reassured.  Since the application of silver nitrate on the cervix February 24, 2019, patient is experiencing  only very minor spotting after intercourse.  Decision to observe.  2. Status post endometrial ablation  Other orders - triamcinolone cream (KENALOG) 0.1 %  Princess Bruins MD, 10:31 AM 03/16/2019

## 2019-03-17 ENCOUNTER — Encounter: Payer: Self-pay | Admitting: Obstetrics & Gynecology

## 2019-03-17 NOTE — Patient Instructions (Signed)
1. Postmenopausal bleeding Pelvic ultrasound findings reviewed thoroughly with patient.  Patient reassured that the endometrial lining is even thinner today at 2.8 mm.  Uterus and ovaries also normal.  Patient reassured.  Since the application of silver nitrate on the cervix February 24, 2019, patient is experiencing only very minor spotting after intercourse.  Decision to observe.  2. Status post endometrial ablation  Angela Ramsey, it was a pleasure seeing you today!

## 2019-03-31 ENCOUNTER — Ambulatory Visit: Payer: 59

## 2019-04-04 ENCOUNTER — Other Ambulatory Visit: Payer: Self-pay

## 2019-04-04 ENCOUNTER — Ambulatory Visit
Admission: RE | Admit: 2019-04-04 | Discharge: 2019-04-04 | Disposition: A | Discharge status: Home / Self Care | Payer: 59 | Source: Ambulatory Visit | Attending: Family Medicine | Admitting: Family Medicine

## 2019-04-04 DIAGNOSIS — Z1231 Encounter for screening mammogram for malignant neoplasm of breast: Secondary | ICD-10-CM

## 2019-04-05 ENCOUNTER — Other Ambulatory Visit: Payer: Self-pay | Admitting: Family Medicine

## 2019-04-05 DIAGNOSIS — N63 Unspecified lump in unspecified breast: Secondary | ICD-10-CM

## 2019-04-10 ENCOUNTER — Ambulatory Visit
Admission: RE | Admit: 2019-04-10 | Discharge: 2019-04-10 | Disposition: A | Discharge status: Home / Self Care | Payer: 59 | Source: Ambulatory Visit | Attending: Family Medicine | Admitting: Family Medicine

## 2019-04-10 ENCOUNTER — Other Ambulatory Visit: Payer: Self-pay

## 2019-04-10 DIAGNOSIS — N63 Unspecified lump in unspecified breast: Secondary | ICD-10-CM

## 2019-04-12 ENCOUNTER — Encounter: Payer: Self-pay | Admitting: Genetic Counselor

## 2019-04-17 ENCOUNTER — Ambulatory Visit
Admission: EM | Admit: 2019-04-17 | Discharge: 2019-04-17 | Disposition: A | Discharge status: Home / Self Care | Payer: 59 | Attending: Physician Assistant | Admitting: Physician Assistant

## 2019-04-17 DIAGNOSIS — R21 Rash and other nonspecific skin eruption: Secondary | ICD-10-CM

## 2019-04-17 MED ORDER — TRIAMCINOLONE ACETONIDE 0.025 % EX OINT: 1.0000 "application " | TOPICAL_OINTMENT | Freq: Every day | CUTANEOUS | 0 refills | Status: DC

## 2019-04-17 MED ORDER — HYDROXYZINE HCL 25 MG PO TABS: 25.0000 mg | ORAL_TABLET | Freq: Four times a day (QID) | ORAL | 0 refills | Status: DC | PRN

## 2019-04-17 MED ORDER — SARNA 0.5-0.5 % EX LOTN: 1.0000 "application " | TOPICAL_LOTION | CUTANEOUS | 0 refills | Status: DC | PRN

## 2019-04-17 NOTE — Discharge Instructions (Signed)
Continue triamcinolone cream as directed. Add triamcinolone ointment at night, wrap with plastic wrap, and clean off in the morning. SARNA lotion and hydroxyzine for itching. Monitor for signs of infection including spreading erythema, warmth, fever. Otherwise, follow up with dermatology for further evaluation and management needed.

## 2019-04-17 NOTE — ED Provider Notes (Signed)
[facility]    CSN: [id number] Arrival date & time: 04/17/19  1416      History   Chief Complaint Chief Complaint  Patient presents with  . Rash    HPI Angela Ramsey is a 54 y.o. female.   54 year old female comes in for continued itching/rash since 11/2018. She has had rash to bilateral anterior upper thighs and bilateral upper medial arm. Has seen dermatology, and thought to be eczema related, started on triamcinolone cream, for which she has been compliant with, without relief. States now with increased itching and therefore came in for evaluation. Denies fever, chills, body aches. Area can be tender at times with palpation. Denies spreading erythema, warmth. No obvious hygiene product changes.      Past Medical History:  Diagnosis Date  . Anxiety   . Endometriosis   . Heart murmur    as a child  . 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 Unspecified menopausal and perimenopausal disorder 05/18/2018  . Hyperlipidemia 05/18/2018  . 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 Strong Family history of breast cancer in females 04/26/2018  . Genital herpes 02/24/2016  . Hemorrhoids- small external nonthrombosed hemorrhoid perirectal area at 10 PM 02/24/2016  . External hemorrhoids without complication 73/42/8768  . Screening for multiple conditions 12/31/2015  . Leukocytosis 12/31/2015  . Environmental and seasonal allergies 12/01/2015  . BMI 37.0-37.9, adult 10/16/2015  . GAD (generalized anxiety disorder) 10/16/2015  . Family history of early CAD 10/16/2015  . Nonspecific chest pain 10/16/2015  . Genetic testing 09/21/2014  . Family history of breast cancer   . Family history of thyroid cancer   . h/o ovarian Endometriosis     Past Surgical History:  Procedure Laterality Date  . BREAST BIOPSY  Left 2016  . BREAST SURGERY     Breast Bx benign  . FOOT SURGERY Left    plantar fasciitis  . NOVASURE ABLATION  10/2004  . PELVIC LAPAROSCOPY  12/2002   endometriosis    OB History    Gravida  2   Para  2   Term  2   Preterm      AB      Living  2     SAB      TAB      Ectopic      Multiple      Live Births               Home Medications    Prior to Admission medications   Medication Sig Start Date End Date Taking? Authorizing Provider  camphor-menthol Timoteo Ace) lotion Apply 1 application topically as needed for itching. 04/17/19   Ok Edwards, PA-C  Cholecalciferol (VITAMIN D) 50 MCG (2000 UT) tablet Take 1 tablet (2,000 Units total) by mouth daily. 12/06/18   Opalski, Neoma Laming, DO  FLUoxetine (PROZAC) 20 MG capsule Take 1 capsule (20 mg total) by mouth daily. 01/03/19   Fontaine, Belinda Block, MD  hydrOXYzine (ATARAX/VISTARIL) 25 MG tablet Take 1 tablet (25 mg total) by mouth every 6 (six) hours as needed for itching. 04/17/19   Tasia Catchings, Lawrence Roldan V, PA-C  triamcinolone (KENALOG) 0.025 % ointment Apply 1 application topically at bedtime. 04/17/19   Tasia Catchings, Daylin Eads V, PA-C  triamcinolone cream (KENALOG) 0.1 %  02/27/19   [provider]    Family History Family History  Problem Relation Age of Onset  . Hypertension Father   . Hyperlipidemia Father   . Stroke Father   . Breast cancer Maternal Aunt 67       mother's identical twin  . Cancer Maternal Aunt        breast  . Cancer Maternal Grandfather        lung  . Diabetes Paternal Grandmother   . Breast cancer Maternal Grandmother        dx in her late 57s to early 52s  . Cancer Maternal Grandmother        breast  . Breast cancer Maternal Aunt 49       second cancer at  51  . Cancer Maternal Aunt        breast x 2  . Leukemia Paternal Grandfather   . Cancer Paternal Grandfather        leukemia  . Hypertension Mother   . Thyroid cancer Sister        dx in her early to mid 28s  . Cancer Sister        thyroid  .  Breast cancer Paternal Aunt 86  . Colon cancer Neg Hx   . Esophageal cancer Neg Hx   . Rectal cancer Neg Hx   . Stomach cancer Neg Hx     Social History Social History   Tobacco Use  . Smoking status: Never Smoker  . Smokeless tobacco: Never Used  Substance Use Topics  . Alcohol use: Yes    Alcohol/week: 1.0 standard drinks    Types: 1 Glasses of wine per week  . Drug use: No     Allergies   Patient has no known allergies.   Review of Systems Review of Systems  Reason unable to perform ROS: See HPI as above.     Physical Exam Triage Vital Signs ED Triage Vitals  Enc Vitals Group     BP 04/17/19 1432 (!) 150/101     Pulse Rate 04/17/19 1432 74     Resp 04/17/19 1432 18     Temp 04/17/19 1432 98.1 F (36.7 C)     Temp Source 04/17/19 1432 Oral     SpO2 04/17/19 1432 97 %     Weight --      Height --      Head Circumference --      Peak Flow --      Pain Score 04/17/19 1433 0     Pain Loc --      Pain Edu? --      Excl. in [place]? --    No data found.  Updated Vital Signs BP (!) 150/101 (BP Location: Left Arm)   Pulse 74   Temp 98.1 F (36.7 C) (Oral)   Resp 18   LMP 12/25/2015 (Exact Date)   SpO2 97%   Physical Exam Constitutional:      General: She is not in acute distress.    Appearance: Normal appearance. She is well-developed. She is not toxic-appearing or diaphoretic.  HENT:     Head: Normocephalic and atraumatic.  Eyes:     Conjunctiva/sclera: Conjunctivae normal.     Pupils: Pupils are equal, round, and reactive to light.  Pulmonary:     Effort: Pulmonary effort is normal. No respiratory distress.     Comments: Speaking in full sentences without difficulty Musculoskeletal:     Cervical back: Normal range of motion and neck supple.  Skin:    General: Skin is warm and dry.     Comments: Skin thickening to bilateral upper medial arms and bilateral anterior thighs. No surrounding erythema, warmth. No induration, fluctuance. No tenderness to  palpation.  Neurological:     Mental Status: She is alert and oriented to person, place, and time.      UC Treatments / Results  Labs (all labs ordered are listed, but only abnormal results are displayed) Labs Reviewed - No data to display  EKG   Radiology No results found.  Procedures Procedures (including critical care time)  Medications Ordered in UC Medications - No data to display  Initial Impression / Assessment and Plan / UC Course  I have reviewed the triage vital signs and the nursing notes.  Pertinent labs & imaging results that were available during my care of the patient were reviewed by me and considered in my medical decision making (see chart for details).    Appearance of rash still consistent with eczema, though larger in size compared to visit with dermatology.  Will have patient continue triamcinolone cream, and add ointment at night.  Will use Sarna and hydroxyzine to control itching.  Patient to follow-up with dermatology again for further evaluation if symptoms do not improving.  Return precautions given.  Patient expresses understanding and agrees plan.  Final Clinical Impressions(s) / UC Diagnoses   Final diagnoses:  Rash   ED Prescriptions    Medication Sig Dispense Auth. Provider   triamcinolone (KENALOG) 0.025 % ointment Apply 1 application topically at bedtime. 30 g Shavona Gunderman V, PA-C   camphor-menthol [facility]) lotion Apply 1 application topically as needed for itching. 222 mL Asmaa Tirpak V, PA-C   hydrOXYzine (ATARAX/VISTARIL) 25 MG tablet Take 1 tablet (25 mg total) by mouth every 6 (six) hours as needed for itching. 15 tablet Ok Edwards, PA-C     PDMP not reviewed this encounter.   Ok Edwards, PA-C 04/17/19 1539

## 2019-04-17 NOTE — ED Triage Notes (Signed)
Pt c/o a rash to bilateral anterior upper thighs and bilateral inside of upper arms since 10/20. States all started after getting shingle vaccine. States has been tx by PCP with no relief.

## 2019-05-02 ENCOUNTER — Encounter: Payer: Self-pay | Admitting: Family Medicine

## 2019-05-02 ENCOUNTER — Other Ambulatory Visit: Payer: Self-pay

## 2019-05-02 ENCOUNTER — Ambulatory Visit: Payer: 59 | Admitting: Family Medicine

## 2019-05-02 ENCOUNTER — Ambulatory Visit (INDEPENDENT_AMBULATORY_CARE_PROVIDER_SITE_OTHER): Payer: 59 | Admitting: Family Medicine

## 2019-05-02 VITALS — BP 130/85 | HR 72 | Temp 98.6°F | Resp 14 | Ht 60.0 in | Wt 212.2 lb

## 2019-05-02 DIAGNOSIS — G4700 Insomnia, unspecified: Secondary | ICD-10-CM | POA: Diagnosis not present

## 2019-05-02 DIAGNOSIS — Z803 Family history of malignant neoplasm of breast: Secondary | ICD-10-CM

## 2019-05-02 DIAGNOSIS — F411 Generalized anxiety disorder: Secondary | ICD-10-CM

## 2019-05-02 DIAGNOSIS — L309 Dermatitis, unspecified: Secondary | ICD-10-CM | POA: Insufficient documentation

## 2019-05-02 DIAGNOSIS — E559 Vitamin D deficiency, unspecified: Secondary | ICD-10-CM

## 2019-05-02 DIAGNOSIS — D72829 Elevated white blood cell count, unspecified: Secondary | ICD-10-CM

## 2019-05-02 DIAGNOSIS — E785 Hyperlipidemia, unspecified: Secondary | ICD-10-CM

## 2019-05-02 MED ORDER — TRAZODONE HCL 100 MG PO TABS: 50.0000 mg | ORAL_TABLET | Freq: Every evening | ORAL | 0 refills | Status: DC | PRN

## 2019-05-02 NOTE — Progress Notes (Signed)
Impression and Recommendations:    1. GAD (generalized anxiety disorder)   2. Vitamin D insufficiency   3. Hyperlipidemia, unspecified hyperlipidemia type   4. Insomnia, unspecified type   5. obesity ([place])   6. Strong Family history of breast cancer in females   7. Eczema, unspecified type   8. Leukocytosis, unspecified type     Leukocytosis - CBC with differential ordered today for re-check. - Will continue to monitor.   GAD (generalized anxiety disorder) - Currently managed on Prozac. Denies need for dose adjustment - Patient tolerating medication well without S-E.  - In addition to prescription intervention, reviewed the "spokes of the wheel" of mood and health management.  Stressed the importance of ongoing prudent habits, including regular exercise, appropriate sleep hygiene, healthful dietary habits, and prayer/meditation to relax.  - For mood management and improved sleep, encouraged patient to engage in an hour of exercise per day.  - Patient declines referral to therapist/counselor.  - Encouraged patient to begin a journal at home of personal goals each day and other things to journal about. - Advised patient to write about her emotions, feelings, and goals for the day. - Encouraged patient to set small, discrete achievements to accomplish during the day, such as "10 minutes of walking today."   Not "my goal is not exercise more."  - Encouraged patient to begin mindfulness breathing and meditation daily. USe of various apps r/w pt  - Will continue to monitor.   Insomnia - Chronic and ongoing but Worsening at this time.  - Discussed R/B various sleep meds / beginning trazodone today. - Reviewed that this can assist with sleep and also assist with anxiety.  - Patient agrees to begin trazodone today.  See med list. - 100 mg provided today.  Advised patient to take one-half tablet, up to one tablet per night.  - Reviewed prudent sleep hygiene with patient  today extensively. -Discussed sleep meditation and sleep stories can be very beneficial as well a sound machine etc. - Advised patient to aim for 8 hours of sleep. - Try to stay awake during the day and sleep only at night. - Encouraged patient to exercise during the day for additional stress relief.  - Will continue to monitor.   Vitamin D insufficiency - Stable at this time. - Continue supplementation as prescribed.  See med list. - Will continue to monitor and re-check as discussed.   Hyperlipidemia, unspecified hyperlipidemia type - Last FLP obtained October of 2020.    - Reck yrly since stable with low ASCVD Risk - The 10-year ASCVD risk score Mikey Bussing DC Jr., et al., 2013) is: 1.4%  - Encouraged patient to adopt prudent dietary changes such as low saturated & trans fat diets for hyperlipidemia and low carb diets for hypertriglyceridemia.  - Encouraged patient to follow AHA guidelines for regular exercise and also engage in weight loss if BMI above 25.   - Will continue to monitor and re-check as discussed.   Dermatological Concerns - Eczema - Stable on current treatment plan through dermatology. - Continue management as established. - Advised patient to return to dermatology and have skin scrapings etc. done as advised. - Encouraged patient to ask dermatology if she should be seen by allergist in addition.  - Will continue to monitor alongside specialist(s).   Female Health - Strong Family History of Breast Cancer in Females - For hormonal/menopausal concerns, patient will continue to follow up with Dr. Dellis Filbert. - Will continue to  monitor alongside specialist.   [place] patient to continue working toward exercising to improve overall mental, physical, and emotional health.    - Encouraged patient to engage in daily physical activity as tolerated, especially a formal exercise routine.  Recommended that the patient eventually  strive for at least 150 minutes of moderate cardiovascular activity per week according to guidelines established by the [facility]   - Health counseling performed.  All questions answered.   Follow-up recommendations  - PT DESIRES TO Return in 4-5 weeks after progress on small lifestyle goals which pt will make on her own once she thinks about them.    Orders Placed This Encounter  Procedures  . CBC with Differential/Platelet    Meds ordered this encounter  Medications  . traZODone (DESYREL) 100 MG tablet    Sig: Take 0.5-1 tablets (50-100 mg total) by mouth at bedtime as needed for sleep.    Dispense:  90 tablet    Refill:  0    Please see AVS handed out to patient at the end of our visit for further patient instructions/ counseling done pertaining to today's office visit.   Return for f/up 4-5 weeks after progress on lifestyle / health goals.     Note:  This note was prepared with assistance of Dragon voice recognition software. Occasional wrong-word or sound-a-like substitutions may have occurred due to the inherent limitations of voice recognition software.   The [place] was signed into law in 2016 which includes the topic of electronic health records.  This provides immediate access to information in MyChart.  This includes consultation notes, operative notes, office notes, lab results and pathology reports.  If you have any questions about what you read please let us know at your next visit or call us at the office.  We are right here with you.  This case required medical decision making of at least moderate complexity.  This document serves as a record of services personally performed by Mellody Dance, DO. It was created on her behalf by Toni Amend, a trained medical scribe. The creation of this record is based on the scribe's personal observations and the provider's statements to them.   The above documentation from Toni Amend, medical scribe,  has been reviewed by Marjory Sneddon, D.O.        --------------------------------------------------------------------------------------------------------------------------------------------------------------------------------------------------------------------------------------------    Subjective:     Angela Ramsey, am serving as scribe for Dr.Cale Decarolis.  HPI: Angela Ramsey is a 54 y.o. female who presents to [place] at [facility] today for issues as discussed below.  - Lifestyle She is currently a homemaker.  Says she sets her own schedule and does computer work sometimes, "but it's always been at home, my own hours."  She typically cleans, does laundry all day.  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  She also crochets and does home crafts.  She plays some computer games, and engages in an exercise video about once per week.  - Female Health Has been going to Dr. Dellis Filbert for routine care due to her concerns about post-menopausal bleeding.  Says they're "watching and waiting" to see if she needs to have a hysterectomy.  Had a knot in her breast recently examined, but notes she was told it was just fibrous tissue.  Notes she has family history of breast cancer, in her grandparents, aunt, and mother's twin sister.  She had genetic testing done and notes  a genetic mutation was found, but they aren't sure if it increases her risk of breast cancer.  Says "they don't know what it does."  Patient becomes emotional and tearful during appointment today while discussing her health concerns.  - GAD Confirms she's been more stressed lately.  She continues taking Prozac for management.  - Vitamin D Insufficiency She continues Vitamin D regularly.  - Insomnia Feels she's sleeping too little and notes "I could sleep all day long."  Believes she gets 5 hours of sleep per night if she's lucky, because she wakes up so often.    She's never been a good  sleeper, and notes "it seems to be getting worse."  Says now she wants to sleep more during the day; "it's like I've gotta have naps."  She has trouble both falling asleep and staying asleep.  - Eczema, Dermatological Concerns; Seen by Dermatology, Urgent Care Notes "they think I have eczema."   She takes vistaril/atarax/hydroxyzine as-needed if she's itching.   The Urgent Care at [facility] placed her on hydroxyzine.  Kenalog ointment was also prescribed by Urgent Care, along with kenalog lotion for the itching.  She went to Urgent Care because she couldn't get in with the dermatologist, and couldn't get in with the clinic here, "so I had to do something."  She last went to dermatologist in January, and was told "she thinks it was eczema."  Says after that, her symptoms improved, and it "went away."  Says the areas of concern are not itching her or bothering her anymore, but are "still there," still visible on her skin.  States she received her second dose of the shingles vaccine and felt "after that, I got more spots."  She was reassured by several sources that the shingles vaccination was unlikely to cause these spots to come up.  Says now her skin is "making big circles," bigger than before, and "purple spots have come out."  Says these pots "did puff up and get hot" at one point, but this has resolved.  She has not been back to see dermatology yet.    Depression screen [facility] 2/9 05/02/2019 12/05/2018 04/26/2018  Decreased Interest 0 0 1  Down, Depressed, Hopeless 0 0 0  PHQ - 2 Score 0 0 1  Altered sleeping '2 1 2  ' Tired, decreased energy '3 3 3  ' Change in appetite 0 0 1  Feeling bad or failure about yourself  0 0 0  Trouble concentrating 0 0 0  Moving slowly or fidgety/restless 0 0 0  Suicidal thoughts 0 0 0  PHQ-9 Score '5 4 7  ' Difficult doing work/chores Somewhat difficult Somewhat difficult -   GAD 7 : Generalized Anxiety Score 05/02/2019 11/20/2015  Nervous, Anxious, on Edge 0 0   Control/stop worrying 1 1  Worry too much - different things 1 0  Trouble relaxing 1 0  Restless 0 0  Easily annoyed or irritable 2 0  Afraid - awful might happen 1 0  Total GAD 7 Score 6 1  Anxiety Difficulty Somewhat difficult Not difficult at all    Wt Readings from Last 3 Encounters:  05/02/19 212 lb 3.2 oz (96.3 kg)  01/03/19 202 lb (91.6 kg)  12/05/18 203 lb (92.1 kg)   BP Readings from Last 3 Encounters:  05/02/19 130/85  04/17/19 (!) 150/101  03/16/19 128/80   Pulse Readings from Last 3 Encounters:  05/02/19 72  04/17/19 74  12/05/18 64   BMI Readings from Last 3 Encounters:  05/02/19 41.44 kg/m  01/03/19 39.45 kg/m  12/05/18 39.65 kg/m     Patient Care Team    Relationship Specialty Notifications Start End  Mellody Dance, DO PCP - General Family Medicine  12/30/15   Phineas Real Belinda Block, MD Consulting Physician Gynecology  10/16/15      Patient Active Problem List   Diagnosis Date Noted  . Hyperlipidemia 05/18/2018  . Genital herpes 02/24/2016  . BMI 37.0-37.9, adult 10/16/2015  . GAD (generalized anxiety disorder) 10/16/2015  . Nonspecific chest pain 10/16/2015  . Vitamin D insufficiency 05/02/2019  . Insomnia 05/02/2019  . obesity ([place]) 05/02/2019  . Eczema 05/02/2019  . Unspecified menopausal and perimenopausal disorder 05/18/2018  . 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 Strong Family history of breast cancer in females 04/26/2018  . Hemorrhoids- small external nonthrombosed hemorrhoid perirectal area at 10 PM 02/24/2016  . External hemorrhoids without complication 69/50/7225  . Screening for multiple conditions 12/31/2015  . Leukocytosis 12/31/2015  . Environmental and seasonal allergies 12/01/2015  . Family history of early CAD 10/16/2015  . Genetic testing 09/21/2014  . Family history of breast cancer   . Family history of thyroid cancer   . h/o ovarian Endometriosis     Past Medical history, Surgical history, Family history, Social  history, Allergies and Medications have been entered into the medical record, reviewed and changed as needed.    Current Meds  Medication Sig  . camphor-menthol (SARNA) lotion Apply 1 application topically as needed for itching.  . Cholecalciferol (VITAMIN D) 50 MCG (2000 UT) tablet Take 1 tablet (2,000 Units total) by mouth daily.  Marland Kitchen FLUoxetine (PROZAC) 20 MG capsule Take 1 capsule (20 mg total) by mouth daily.  . hydrOXYzine (ATARAX/VISTARIL) 25 MG tablet Take 1 tablet (25 mg total) by mouth every 6 (six) hours as needed for itching.  . triamcinolone (KENALOG) 0.025 % ointment Apply 1 application topically at bedtime.  . triamcinolone cream (KENALOG) 0.1 %     Allergies:  No Known Allergies   Review of Systems:  A fourteen system review of systems was performed and found to be positive as per HPI.   Objective:   Blood pressure 130/85, pulse 72, temperature 98.6 F (37 C), temperature source Oral, resp. rate 14, height 5' (1.524 m), weight 212 lb 3.2 oz (96.3 kg), last menstrual period 12/25/2015, SpO2 96 %. Body mass index is 41.44 kg/m. General:  Well Developed, well nourished, appropriate for stated age.  Neuro:  Alert and oriented,  extra-ocular muscles intact  HEENT:  Normocephalic, atraumatic, neck supple, no carotid bruits appreciated  Skin:  no gross rash, warm, pink. Cardiac:  RRR, S1 S2 Respiratory:  ECTA B/L and A/P, Not using accessory muscles, speaking in full sentences- unlabored. Vascular:  Ext warm, no cyanosis apprec.; cap RF less 2 sec. Psych:  No HI/SI, judgement and insight good, Euthymic mood. Full Affect.

## 2019-05-02 NOTE — Patient Instructions (Addendum)
Health Goals Remember to set small goals that you can accomplish, to help give you a sense of achievement.  Please set one goal for physical wellbeing, involving hysical activity (such as 10 minutes of walking every day for a week, increased to 15 minutes of walking every day the next week, etc.) Please set one goal for mental wellbeing (such as 10 minutes of meditation daily, deep breathing)  Please set one goal for dietary wellbeing (such as no ice cream after dinner for a week, or 100 ounces of water daily, etc.).  Please look into the book Atomic Habits, and other self-help books.  You can find this through [employer].       If you have insomnia or difficulty sleeping, this information is for you:  - Avoid caffeinated beverages after lunch,  no alcoholic beverages,  no eating within 2-3 hours of lying down,  avoid exposure to blue light before bed,  avoid daytime naps, and  needs to maintain a regular sleep schedule- go to sleep and wake up around the same time every night.   - Resolve concerns or worries before entering bedroom:  Discussed relaxation techniques with patient and to keep a journal to write down fears\ worries.  I suggested seeing a counselor for CBT.   - Recommend patient meditate or do deep breathing exercises to help relax.   Incorporate the use of white noise machines or listen to "sleep meditation music", or recordings of guided meditations for sleep from YouTube which are free, such as  "guided meditation for detachment from over thinking"  by Mayford Knife.       Behavior Modification Ideas for Weight Management  Weight management involves adopting a healthy lifestyle that includes a knowledge of nutrition and exercise, a positive attitude and the right kind of motivation. Internal motives such as better health, increased energy, self-esteem and personal control increase your chances of lifelong weight management success.  Remember to have realistic goals and  think long-term success. Believe in yourself and you can do it. The following information will give you ideas to help you meet your goals.  Control Your Home Environment  Eat only while sitting down at the kitchen or dining room table. Do not eat while watching television, reading, cooking, talking on the phone, standing at the refrigerator or working on the computer. Keep tempting foods out of the house -- don't buy them. Keep tempting foods out of sight. Have low-calorie foods ready to eat. Unless you are preparing a meal, stay out of the kitchen. Have healthy snacks at your disposal, such as small pieces of fruit, vegetables, canned fruit, pretzels, low-fat string cheese and nonfat cottage cheese.  Control Your Work Environment  Do not eat at [employer] or keep tempting snacks at your desk. If you get hungry between meals, plan healthy snacks and bring them with you to work. During your breaks, go for a walk instead of eating. If you work around food, plan in advance the one item you will eat at mealtime. Make it inconvenient to nibble on food by chewing gum, sugarless candy or drinking water or another low-calorie beverage. Do not work through meals. Skipping meals slows down metabolism and may result in overeating at the next meal. If food is available for special occasions, either pick the healthiest item, nibble on low-fat snacks brought from home, don't have anything offered, choose one option and have a small amount, or have only a beverage.  Control Your Mealtime Environment  Serve your plate of food at the stove or kitchen counter. Do not put the serving dishes on the table. If you do put dishes on the table, remove them immediately when finished eating. Fill half of your plate with vegetables, a quarter with lean protein and a quarter with starch. Use smaller plates, bowls and glasses. A smaller portion will look large when it is in a little dish. Politely refuse second  helpings. When fixing your plate, limit portions of food to one scoop/serving or less.   Daily Food Management  Replace eating with another activity that you will not associate with food. Wait 20 minutes before eating something you are craving. Drink a large glass of water or diet soda before eating. Always have a big glass or bottle of water to drink throughout the day. Avoid high-calorie add-ons such as cream with your coffee, butter, mayonnaise and salad dressings.  Shopping: Do not shop when hungry or tired. Shop from a list and avoid buying anything that is not on your list. If you must have tempting foods, buy individual-sized packages and try to find a lower-calorie alternative. Don't taste test in the store. Read food labels. Compare products to help you make the healthiest choices.  Preparation: Chew a piece of gum while cooking meals. Use a quarter teaspoon if you taste test your food. Try to only fix what you are going to eat, leaving yourself no chance for seconds. If you have prepared more food than you need, portion it into individual containers and freeze or refrigerate immediately. Don't snack while cooking meals.  Eating: Eat slowly. Remember it takes about 20 minutes for your stomach to send a message to your brain that it is full. Don't let fake hunger make you think you need more. The ideal way to eat is to take a bite, put your utensil down, take a sip of water, cut your next bite, take a bit, put your utensil down and so on. Do not cut your food all at one time. Cut only as needed. Take small bites and chew your food well. Stop eating for a minute or two at least once during a meal or snack. Take breaks to reflect and have conversation.  Cleanup and Leftovers: Label leftovers for a specific meal or snack. Freeze or refrigerate individual portions of leftovers. Do not clean up if you are still hungry.  Eating Out and Social Eating  Do not arrive hungry.  Eat something light before the meal. Try to fill up on low-calorie foods, such as vegetables and fruit, and eat smaller portions of the high-calorie foods. Eat foods that you like, but choose small portions. If you want seconds, wait at least 20 minutes after you have eaten to see if you are actually hungry or if your eyes are bigger than your stomach. Limit alcoholic beverages. Try a soda water with a twist of lime. Do not skip other meals in the day to save room for the special event.  At Restaurants: Order  la carte rather than buffet style. Order some vegetables or a salad for an appetizer instead of eating bread. If you order a high-calorie dish, share it with someone. Try an after-dinner mint with your coffee. If you do have dessert, share it with two or more people. Don't overeat because you do not want to waste food. Ask for a doggie bag to take extra food home. Tell the server to put half of your entree in a to go bag before  the meal is served to you. Ask for salad dressing, gravy or high-fat sauces on the side. Dip the tip of your fork in the dressing before each bite. If bread is served, ask for only one piece. Try it plain without butter or oil. At [employer] where oil and vinegar is served with bread, use only a small amount of oil and a lot of vinegar for dipping.  At a [facility]: Offer to bring a dish, appetizer or dessert that is low in calories. Serve yourself small portions or tell the host that you only want a small amount. Stand or sit away from the snack table. Stay away from the kitchen or stay busy if you are near the food. Limit your alcohol intake.  At [employer] and Cafeterias: Cover most of your plate with lettuce and/or vegetables. Use a salad plate instead of a dinner plate. After eating, clear away your dishes before having coffee or tea.  Entertaining at Home: Explore low-fat, low-cholesterol cookbooks. Use single-serving foods like chicken  breasts or hamburger patties. Prepare low-calorie appetizers and desserts.   Holidays: Keep tempting foods out of sight. Decorate the house without using food. Have low-calorie beverages and foods on hand for guests. Allow yourself one planned treat a day. Don't skip meals to save up for the holiday feast. Eat regular, planned meals.   Exercise Well  Make exercise a priority and a planned activity in the day. If possible, walk the entire or part of the distance to work. Get an exercise buddy. Go for a walk with a colleague during one of your breaks, go to the gym, run or take a walk with a friend, walk in the mall with a shopping companion. Park at the end of the parking lot and walk to the store or office entrance. Always take the stairs all of the way or at least part of the way to your floor. If you have a desk job, walk around the office frequently. Do leg lifts while sitting at your desk. Do something outside on the weekends like going for a hike or a bike ride.   Have a Healthy Attitude  Make health your weight management priority. Be realistic. Have a goal to achieve a healthier you, not necessarily the lowest weight or ideal weight based on calculations or tables. Focus on a healthy eating style, not on dieting. Dieting usually lasts for a short amount of time and rarely produces long-term success. Think long term. You are developing new healthy behaviors to follow next month, in a year and in a decade.    This information is for educational purposes only and is not intended to replace the advice of your doctor or health care provider. We encourage you to discuss with your doctor any questions or concerns you may have.        Guidelines for Losing Weight   We want weight loss that will last so you should lose 1-2 pounds a week.  THAT IS IT! Please pick THREE things a month to change. Once it is a habit check off the item. Then pick another three items off the list  to become habits.  If you are already doing a habit on the list GREAT!  Cross that item off!  Don't drink your calories. Ie, alcohol, soda, fruit juice, and sweet tea.   Drink more water. Drink a glass when you feel hungry or before each meal.   Eat breakfast - Complex carb and protein (likeDannon light  and fit yogurt, oatmeal, fruit, eggs, [place] bacon).  Measure your cereal.  Eat no more than one cup a day. (ie Kashi)  Eat an apple a day.  Add a vegetable a day.  Try a new vegetable a month.  Use Pam! Stop using oil or butter to cook.  Don't finish your plate or use smaller plates.  Share your dessert.  Eat sugar free Jello for dessert or frozen grapes.  Don't eat 2-3 hours before bed.  Switch to whole wheat bread, pasta, and brown rice.  Make healthier choices when you eat out. No fries!  Pick baked chicken, NOT fried.  Don't forget to SLOW DOWN when you eat. It is not going anywhere.   Take the stairs.  Park far away in the parking lot  Lift soup cans (or weights) for 10 minutes while watching TV.  Walk at work for 10 minutes during break.  Walk outside 1 time a week with your friend, kids, dog, or significant other.  Start a walking group at church.  Walk the mall as much as you can tolerate.   Keep a food diary.  Weigh yourself daily.  Walk for 15 minutes 3 days per week.  Cook at home more often and eat out less. If life happens and you go back to old habits, it is okay.  Just start over. You can do it!  If you experience chest pain, get short of breath, or tired during the exercise, please stop immediately and inform your doctor.    Before you even begin to attack a weight-loss plan, it pays to remember this: You are not fat. You have fat. Losing weight isn't about blame or shame; it's simply another achievement to accomplish. Dieting is like any other skill--you have to buckle down and work at it. As long as you act in a smart, reasonable way,  you'll ultimately get where you want to be. Here are some weight loss pearls for you.   1. It's Not a Diet. It's a Lifestyle Thinking of a diet as something you're on and suffering through only for the short term doesn't work. To shed weight and keep it off, you need to make permanent changes to the way you eat. It's OK to indulge occasionally, of course, but if you cut calories temporarily and then revert to your old way of eating, you'll gain back the weight quicker than you can say yo-yo. Use it to lose it. Research shows that one of the best predictors of long-term weight loss is how many pounds you drop in the first month. For that reason, nutritionists often suggest being stricter for the first two weeks of your new eating strategy to build momentum. Cut out added sugar and alcohol and avoid unrefined carbs. After that, figure out how you can reincorporate them in a way that's healthy and maintainable.  2. There's a Right Way to Exercise Working out burns calories and fat and boosts your metabolism by building muscle. But those trying to lose weight are notorious for overestimating the number of calories they burn and underestimating the amount they take in. Unfortunately, your system is biologically programmed to hold on to extra pounds and that means when you start exercising, your body senses the deficit and ramps up its hunger signals. If you're not diligent, you'll eat everything you burn and then some. Use it, to lose it. Cardio gets all the exercise glory, but strength and interval training are the real heroes. They help you  build lean muscle, which in turn increases your metabolism and calorie-burning ability 3. Don't Overreact to Mild Hunger Some people have a hard time losing weight because of hunger anxiety. To them, being hungry is bad--something to be avoided at all costs--so they carry snacks with them and eat when they don't need to. Others eat because they're stressed out or bored.  While you never want to get to the point of being ravenous (that's when bingeing is likely to happen), a hunger pang, a craving, or the fact that it's 3:00 p.m. should not send you racing for the vending machine or obsessing about the energy bar in your purse. Ideally, you should put off eating until your stomach is growling and it's difficult to concentrate.  Use it to lose it. When you feel the urge to eat, use the HALT method. Ask yourself, Am I really hungry? Or am I angry or anxious, lonely or bored, or tired? If you're still not certain, try the apple test. If you're truly hungry, an apple should seem delicious; if it doesn't, something else is going on. Or you can try drinking water and making yourself busy, if you are still hungry try a healthy snack.  4. Not All Calories Are Created Equal The mechanics of weight loss are pretty simple: Take in fewer calories than you use for energy. But the kind of food you eat makes all the difference. Processed food that's high in saturated fat and refined starch or sugar can cause inflammation that disrupts the hormone signals that tell your brain you're full. The result: You eat a lot more.  Use it to lose it. Clean up your diet. Swap in whole, unprocessed foods, including vegetables, lean protein, and healthy fats that will fill you up and give you the biggest nutritional bang for your calorie buck. In a few weeks, as your brain starts receiving regular hunger and fullness signals once again, you'll notice that you feel less hungry overall and naturally start cutting back on the amount you eat.  5. Protein, Produce, and Plant-Based Fats Are Your Weight-Loss Trinity Here's why eating the three Ps regularly will help you drop pounds. Protein fills you up. You need it to build lean muscle, which keeps your metabolism humming so that you can torch more fat. People in a weight-loss program who ate double the recommended daily allowance for protein (about 110 grams  for a 150-pound woman) lost 70 percent of their weight from fat, while people who ate the RDA lost only about 40 percent, one study found. Produce is packed with filling fiber. "It's very difficult to consume too many calories if you're eating a lot of vegetables. Example: Three cups of broccoli is a lot of food, yet only 93 calories. (Fruit is another story. It can be easy to overeat and can contain a lot of calories from sugar, so be sure to monitor your intake.) Plant-based fats like olive oil and those in avocados and nuts are healthy and extra satiating.  Use it to lose it. Aim to incorporate each of the three Ps into every meal and snack. People who eat protein throughout the day are able to keep weight off, according to a study in the [place] [employer]. In addition to meat, poultry and seafood, good sources are beans, lentils, eggs, tofu, and yogurt. As for fat, keep portion sizes in check by measuring out salad dressing, oil, and nut butters (shoot for one to two tablespoons). Finally, eat veggies  or a little fruit at every meal. People who did that consumed 308 fewer calories but didn't feel any hungrier than when they didn't eat more produce.  7. How You Eat Is As Important As What You Eat In order for your brain to register that you're full, you need to focus on what you're eating. Sit down whenever you eat, preferably at a table. Turn off the TV or computer, put down your phone, and look at your food. Smell it. Chew slowly, and don't put another bite on your fork until you swallow. When women ate lunch this attentively, they consumed 30 percent less when snacking later than those who listened to an audiobook at lunchtime, according to a study in the [place] [employer]. 8. Weighing Yourself Really Works The scale provides the best evidence about whether your efforts are paying off. Seeing the numbers tick up or down or stagnate is motivation to keep going--or to  rethink your approach. A 2015 study at [facility] found that daily weigh-ins helped people lose more weight, keep it off, and maintain that loss, even after two years. Use it to lose it. Step on the scale at the same time every day for the best results. If your weight shoots up several pounds from one weigh-in to the next, don't freak out. Eating a lot of salt the night before or having your period is the likely culprit. The number should return to normal in a day or two. It's a steady climb that you need to do something about. 9. Too Much Stress and Too Little Sleep Are Your Enemies When you're tired and frazzled, your body cranks up the production of cortisol, the stress hormone that can cause carb cravings. Not getting enough sleep also boosts your levels of ghrelin, a hormone associated with hunger, while suppressing leptin, a hormone that signals fullness and satiety. People on a diet who slept only five and a half hours a night for two weeks lost 55 percent less fat and were hungrier than those who slept eight and a half hours, according to a study in the [place]. Use it to lose it. Prioritize sleep, aiming for seven hours or more a night, which research shows helps lower stress. And make sure you're getting quality zzz's. If a snoring spouse or a fidgety cat wakes you up frequently throughout the night, you may end up getting the equivalent of just four hours of sleep, according to a study from [facility]. Keep pets out of the bedroom, and use a white-noise app to drown out snoring. 10. You Will Hit a plateau--And You Can Bust Through It As you slim down, your body releases much less leptin, the fullness hormone.  If you're not strength training, start right now. Building muscle can raise your metabolism to help you overcome a plateau. To keep your body challenged and burning calories, incorporate new moves and more intense intervals into your workouts or  add another sweat session to your weekly routine. Alternatively, cut an extra 100 calories or so a day from your diet. Now that you've lost weight, your body simply doesn't need as much fuel.    Since food equals calories, in order to lose weight you must either eat fewer calories, exercise more to burn off calories with activity, or both. Food that is not used to fuel the body is stored as fat. A major component of losing weight is to make smarter food choices. Here's how:  1)   Limit non-nutritious foods, such as: Sugar, honey, syrups and candy Pastries, donuts, pies, cakes and cookies Soft drinks, sweetened juices and alcoholic beverages  2)  Cut down on high-fat foods by: - Choosing poultry, fish or lean red meat - Choosing low-fat cooking methods, such as baking, broiling, steaming, grilling and boiling - Using low-fat or non-fat dairy products - Using vinaigrette, herbs, lemon or fat-free salad dressings - Avoiding fatty meats, such as bacon, sausage, franks, ribs and luncheon meats - Avoiding high-fat snacks like nuts, chips and chocolate - Avoiding fried foods - Using less butter, margarine, oil and mayonnaise - Avoiding high-fat gravies, cream sauces and cream-based soups  3) Eat a variety of foods, including: - Fruit and vegetables that are raw, steamed or baked - Whole grains, breads, cereal, rice and pasta - Dairy products, such as low-fat or non-fat milk or yogurt, low-fat cottage cheese and low-fat cheese - Protein-rich foods like chicken, [place], fish, lean meat and legumes, or beans  4) Change your eating habits by: - Eat three balanced meals a day to help control your hunger - Watch portion sizes and eat small servings of a variety of foods - Choose low-calorie snacks - Eat only when you are hungry and stop when you are satisfied - Eat slowly and try not to perform other tasks while eating - Find other activities to distract you from food, such as walking, taking  up a hobby or being involved in the community - Include regular exercise in your daily routine ( minimum of 20 min of moderate-intensity exercise at least 5 days/week)  - Find a support group, if necessary, for emotional support in your weight loss journey           Easy ways to cut 100 calories   1. Eat your eggs with hot sauce OR salsa instead of cheese.  Eggs are great for breakfast, but many people consider eggs and cheese to be BFFs. Instead of cheese--1 oz. of cheddar has 114 calories--top your eggs with hot sauce, which contains no calories and helps with satiety and metabolism. Salsa is also a great option!!  2. Top your toast, waffles or pancakes with fresh berries instead of jelly or syrup. Half a cup of berries--fresh, frozen or thawed--has about 40 calories, compared with 2 tbsp. of maple syrup or jelly, which both have about 100 calories. The berries will also give you a good punch of fiber, which helps keep you full and satisfied and won't spike blood sugar quickly like the jelly or syrup. 3. Swap the non-fat latte for black coffee with a splash of half-and-half. Contrary to its name, that non-fat latte has 130 calories and a startling 19g of carbohydrates per 16 oz. serving. Replacing that 'light' drinkable dessert with a black coffee with a splash of half-and-half saves you more than 100 calories per 16 oz. serving. 4. Sprinkle salads with freeze-dried raspberries instead of dried cranberries. If you want a sweet addition to your nutritious salad, stay away from dried cranberries. They have a whopping 130 calories per  cup and 30g carbohydrates. Instead, sprinkle freeze-dried raspberries guilt-free and save more than 100 calories per  cup serving, adding 3g of belly-filling fiber. 5. Go for mustard in place of mayo on your sandwich. Mustard can add really nice flavor to any sandwich, and there are tons of varieties, from spicy to honey. A serving of mayo is 95 calories,  versus 10 calories in a serving of mustard.  Or  try an avocado mayo spread: You can find the recipe few click this link: https://www.californiaavocado.com/recipes/recipe-container/[place]-avocado-mayo 6. Choose a DIY salad dressing instead of the store-bought kind. Mix Dijon or whole grain mustard with low-fat Kefir or red wine vinegar and garlic. 7. Use hummus as a spread instead of a dip. Use hummus as a spread on a high-fiber cracker or tortilla with a sandwich and save on calories without sacrificing taste. 8. Pick just one salad "accessory." Salad isn't automatically a calorie winner. It's easy to over-accessorize with toppings. Instead of topping your salad with nuts, avocado and cranberries (all three will clock in at 313 calories), just pick one. The next day, choose a different accessory, which will also keep your salad interesting. You don't wear all your jewelry every day, right? 9. Ditch the white pasta in favor of spaghetti squash. One cup of cooked spaghetti squash has about 40 calories, compared with traditional spaghetti, which comes with more than 200. Spaghetti squash is also nutrient-dense. It's a good source of fiber and Vitamins A and C, and it can be eaten just like you would eat pasta--with a great tomato sauce and [place] meatballs or with pesto, tofu and spinach, for example. 10. Dress up your chili, soups and stews with non-fat [place] yogurt instead of sour cream. Just a 'dollop' of sour cream can set you back 115 calories and a whopping 12g of fat--seven of which are of the artery-clogging variety. Added bonus: [place] yogurt is packed with muscle-building protein, calcium and B Vitamins. 11. Mash cauliflower instead of mashed potatoes. One cup of traditional mashed potatoes--in all their creamy goodness--has more than 200 calories, compared to mashed cauliflower, which you can typically eat for less than 100 calories per 1 cup serving. Cauliflower is a great source of the  antioxidant indole-3-carbinol (I3C), which may help reduce the risk of some cancers, like breast cancer. 12. Ditch the ice cream sundae in favor of a [place] yogurt parfait. Instead of a cup of ice cream or fro-yo for dessert, try 1 cup of nonfat Greek yogurt topped with fresh berries and a sprinkle of cacao nibs. Both toppings are packed with antioxidants, which can help reduce cellular inflammation and oxidative damage. And the comparison is a no-brainer: One cup of ice cream has about 275 calories; one cup of frozen yogurt has about 230; and a cup of Greek yogurt has just 130, plus twice the protein, so you're less likely to return to the freezer for a second helping. 13. Put olive oil in a spray container instead of using it directly from the bottle. Each tablespoon of olive oil is 120 calories and 15g of fat. Use a mister instead of pouring it straight into the pan or onto a salad. This allows for portion control and will save you more than 100 calories. 14. When baking, substitute canned pumpkin for butter or oil. Canned pumpkin--not pumpkin pie mix--is loaded with Vitamin A, which is important for skin and eye health, as well as immunity. And the comparisons are pretty crazy:  cup of canned pumpkin has about 40 calories, compared to butter or oil, which has more than 800 calories. Yes, 800 calories. Applesauce and mashed banana can also serve as good substitutions for butter or oil, usually in a 1:1 ratio. 15. Top casseroles with high-fiber cereal instead of breadcrumbs. Breadcrumbs are typically made with white bread, while breakfast cereals contain 5-9g of fiber per serving. Not only will you save more than 150 calories per  cup serving,  the swap will also keep you more full and you'll get a metabolism boost from the added fiber. 16. Snack on pistachios instead of macadamia nuts. Believe it or not, you get the same amount of calories from 35 pistachios (100 calories) as you would from only five  macadamia nuts. 17. Chow down on kale chips rather than potato chips. This is my favorite 'don't knock it 'till you try it' swap. Kale chips are so easy to make at home, and you can spice them up with a little grated parmesan or chili powder. Plus, they're a mere fraction of the calories of potato chips, but with the same crunch factor we crave so often. 18. Add seltzer and some fruit slices to your cocktail instead of soda or fruit juice. One cup of soda or fruit juice can pack on as much as 140 calories. Instead, use seltzer and fruit slices. The fruit provides valuable phytochemicals, such as flavonoids and anthocyanins, which help to combat cancer and stave off the aging process.

## 2019-05-03 LAB — CBC WITH DIFFERENTIAL/PLATELET
Basophils Absolute: 0 10*3/uL (ref 0.0–0.2)
Basos: 0 %
EOS (ABSOLUTE): 0.2 10*3/uL (ref 0.0–0.4)
Eos: 2 %
Hematocrit: 44.8 % (ref 34.0–46.6)
Hemoglobin: 14.5 g/dL (ref 11.1–15.9)
Immature Grans (Abs): 0.1 10*3/uL (ref 0.0–0.1)
Immature Granulocytes: 1 %
Lymphocytes Absolute: 3.2 10*3/uL — ABNORMAL HIGH (ref 0.7–3.1)
Lymphs: 33 %
MCH: 29 pg (ref 26.6–33.0)
MCHC: 32.4 g/dL (ref 31.5–35.7)
MCV: 90 fL (ref 79–97)
Monocytes Absolute: 0.5 10*3/uL (ref 0.1–0.9)
Monocytes: 5 %
Neutrophils Absolute: 5.8 10*3/uL (ref 1.4–7.0)
Neutrophils: 59 %
Platelets: 354 10*3/uL (ref 150–450)
RBC: 5 x10E6/uL (ref 3.77–5.28)
RDW: 12.6 % (ref 11.7–15.4)
WBC: 9.7 10*3/uL (ref 3.4–10.8)

## 2019-06-05 ENCOUNTER — Ambulatory Visit: Payer: 59 | Admitting: Family Medicine

## 2019-06-13 ENCOUNTER — Ambulatory Visit: Payer: 59 | Admitting: Family Medicine

## 2019-08-13 ENCOUNTER — Ambulatory Visit
Admission: EM | Admit: 2019-08-13 | Discharge: 2019-08-13 | Disposition: A | Discharge status: Home / Self Care | Payer: 59 | Attending: Physician Assistant | Admitting: Physician Assistant

## 2019-08-13 ENCOUNTER — Other Ambulatory Visit: Payer: Self-pay

## 2019-08-13 ENCOUNTER — Ambulatory Visit: Payer: Self-pay

## 2019-08-13 DIAGNOSIS — J0140 Acute pansinusitis, unspecified: Secondary | ICD-10-CM | POA: Diagnosis not present

## 2019-08-13 MED ORDER — DOXYCYCLINE HYCLATE 100 MG PO CAPS: 100.0000 mg | ORAL_CAPSULE | Freq: Two times a day (BID) | ORAL | 0 refills | Status: DC

## 2019-08-13 MED ORDER — FLUCONAZOLE 150 MG PO TABS: 150.0000 mg | ORAL_TABLET | Freq: Every day | ORAL | 0 refills | Status: DC

## 2019-08-13 MED ORDER — PREDNISONE 50 MG PO TABS: 50.0000 mg | ORAL_TABLET | Freq: Every day | ORAL | 0 refills | Status: DC

## 2019-08-13 NOTE — ED Provider Notes (Signed)
[facility]    CSN: [id number] Arrival date & time: 08/13/19  1351      History   Chief Complaint Chief Complaint  Patient presents with   Cough   Nasal Congestion   Otalgia    HPI Angela Ramsey is a 54 y.o. female.   54 year old female comes in for 2 week history of URI symptoms. Cough, nasal congestion, fatigued. tmax 99, night sweats. Mild loose stools. Denies abdominal pain, nausea, vomiting. Denies shortness of breath, loss of taste/smell. No COVID vaccine. Husband with negative COVID testing during preop screening 5 days ago. otc cold medicine without relief.      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   ATM gene called c.[id number]>G  mutation of unknown significance see genetic counseling note 09/21/2014    Patient Active Problem List   Diagnosis Date Noted   Vitamin D insufficiency 05/02/2019   Insomnia 05/02/2019   obesity ([place]) 05/02/2019   Eczema 05/02/2019   Unspecified menopausal and perimenopausal disorder 05/18/2018   Hyperlipidemia 05/18/2018   mutation of ATM gene 04/26/2018   Strong Family history of breast cancer in females 04/26/2018   Genital herpes 02/24/2016   Hemorrhoids- small external nonthrombosed hemorrhoid perirectal area at 10 PM 02/24/2016   External hemorrhoids without complication 85/03/7739   Screening for multiple conditions 12/31/2015   Leukocytosis 12/31/2015   Environmental and seasonal allergies 12/01/2015   BMI 37.0-37.9, adult 10/16/2015   GAD (generalized anxiety disorder) 10/16/2015   Family history of early CAD 10/16/2015   Nonspecific chest pain 10/16/2015   Genetic testing 09/21/2014   Family history of breast cancer    Family history of thyroid cancer    h/o ovarian Endometriosis     Past Surgical History:  Procedure Laterality Date   BREAST BIOPSY Left 2016    BREAST SURGERY     Breast Bx benign   FOOT SURGERY Left    plantar fasciitis   NOVASURE ABLATION  10/2004   PELVIC LAPAROSCOPY  12/2002   endometriosis    OB History    Gravida  2   Para  2   Term  2   Preterm      AB      Living  2     SAB      TAB      Ectopic      Multiple      Live Births               Home Medications    Prior to Admission medications   Medication Sig Start Date End Date Taking? Authorizing Provider  Cholecalciferol (VITAMIN D) 50 MCG (2000 UT) tablet Take 1 tablet (2,000 Units total) by mouth daily. 12/06/18   Opalski, Neoma Laming, DO  doxycycline (VIBRAMYCIN) 100 MG capsule Take 1 capsule (100 mg total) by mouth 2 (two) times daily. 08/13/19   Tasia Catchings, Dyane Broberg V, PA-C  fluconazole (DIFLUCAN) 150 MG tablet Take 1 tablet (150 mg total) by mouth daily. Take second dose 72 hours later if symptoms still persists. 08/13/19   Tasia Catchings, Maragret Vanacker V, PA-C  FLUoxetine (PROZAC) 20 MG capsule Take 1 capsule (20 mg total) by mouth daily. 01/03/19   Fontaine, Belinda Block, MD  predniSONE (DELTASONE) 50 MG tablet Take 1 tablet (50 mg total) by mouth daily with breakfast. 08/13/19   Tasia Catchings,  Jasiyah Paulding V, PA-C  traZODone (DESYREL) 100 MG tablet Take 0.5-1 tablets (50-100 mg total) by mouth at bedtime as needed for sleep. 05/02/19   Opalski, Neoma Laming, DO  triamcinolone (KENALOG) 0.025 % ointment Apply 1 application topically at bedtime. 04/17/19   Ok Edwards, PA-C  triamcinolone cream (KENALOG) 0.1 %  02/27/19   [provider]    Family History Family History  Problem Relation Age of Onset   Hypertension Father    Hyperlipidemia Father    Stroke Father    Breast cancer Maternal Aunt 19       mother's identical twin   Cancer Maternal Aunt        breast   Cancer Maternal Grandfather        lung   Diabetes Paternal Grandmother    Breast cancer Maternal Grandmother        dx in her late 19s to early 97s   Cancer Maternal Grandmother        breast   Breast cancer  Maternal Aunt 20       second cancer at  60   Cancer Maternal Aunt        breast x 2   Leukemia Paternal Grandfather    Cancer Paternal Grandfather        leukemia   Hypertension Mother    Thyroid cancer Sister        dx in her early to mid 9s   Cancer Sister        thyroid   Breast cancer Paternal Aunt 86   Colon cancer Neg Hx    Esophageal cancer Neg Hx    Rectal cancer Neg Hx    Stomach cancer Neg Hx     Social History Social History   Tobacco Use   Smoking status: Never Smoker   Smokeless tobacco: Never Used  Vaping Use   Vaping Use: Never used  Substance Use Topics   Alcohol use: Yes    Alcohol/week: 1.0 standard drink    Types: 1 Glasses of wine per week   Drug use: No     Allergies   Patient has no known allergies.   Review of Systems Review of Systems  Reason unable to perform ROS: See HPI as above.     Physical Exam Triage Vital Signs ED Triage Vitals  Enc Vitals Group     BP 08/13/19 1358 (!) 142/79     Pulse Rate 08/13/19 1358 65     Resp 08/13/19 1358 13     Temp 08/13/19 1358 98.1 F (36.7 C)     Temp Source 08/13/19 1358 Oral     SpO2 08/13/19 1358 96 %     Weight --      Height --      Head Circumference --      Peak Flow --      Pain Score 08/13/19 1401 0     Pain Loc --      Pain Edu? --      Excl. in [place]? --    No data found.  Updated Vital Signs BP (!) 142/79 (BP Location: Left Arm)    Pulse 65    Temp 98.1 F (36.7 C) (Oral)    Resp 13    LMP 12/25/2015 (Exact Date)    SpO2 96%   Physical Exam Constitutional:      General: She is not in acute distress.    Appearance: Normal appearance. She is well-developed. She is not ill-appearing,  toxic-appearing or diaphoretic.  HENT:     Head: Normocephalic and atraumatic.     Right Ear: Tympanic membrane, ear canal and external ear normal. Tympanic membrane is not erythematous or bulging.     Left Ear: Ear canal and external ear normal. Tympanic membrane is  erythematous. Tympanic membrane is not bulging.     Nose:     Right Sinus: No maxillary sinus tenderness or frontal sinus tenderness.     Left Sinus: No maxillary sinus tenderness or frontal sinus tenderness.     Mouth/Throat:     Mouth: Mucous membranes are moist.     Pharynx: Oropharynx is clear. Uvula midline.  Eyes:     Conjunctiva/sclera: Conjunctivae normal.     Pupils: Pupils are equal, round, and reactive to light.  Cardiovascular:     Rate and Rhythm: Normal rate and regular rhythm.  Pulmonary:     Effort: Pulmonary effort is normal. No accessory muscle usage, prolonged expiration, respiratory distress or retractions.     Breath sounds: No decreased air movement or transmitted upper airway sounds. No decreased breath sounds.     Comments: LCTAB. Coughing throughout exam.  Musculoskeletal:     Cervical back: Normal range of motion and neck supple.  Skin:    General: Skin is warm and dry.  Neurological:     Mental Status: She is alert and oriented to person, place, and time.      UC Treatments / Results  Labs (all labs ordered are listed, but only abnormal results are displayed) Labs Reviewed - No data to display  EKG   Radiology No results found.  Procedures Procedures (including critical care time)  Medications Ordered in UC Medications - No data to display  Initial Impression / Assessment and Plan / UC Course  I have reviewed the triage vital signs and the nursing notes.  Pertinent labs & imaging results that were available during my care of the patient were reviewed by me and considered in my medical decision making (see chart for details).    Doxycycline as directed for sinusitis.  Will add prednisone for sinus pressure, also for possible reactive airway.  Other symptomatic treatment discussed.  Return precautions given.  Final Clinical Impressions(s) / UC Diagnoses   Final diagnoses:  Acute non-recurrent pansinusitis   ED Prescriptions     Medication Sig Dispense Auth. Provider   doxycycline (VIBRAMYCIN) 100 MG capsule Take 1 capsule (100 mg total) by mouth 2 (two) times daily. 14 capsule Ferris Tally V, PA-C   fluconazole (DIFLUCAN) 150 MG tablet Take 1 tablet (150 mg total) by mouth daily. Take second dose 72 hours later if symptoms still persists. 2 tablet Raydell Maners V, PA-C   predniSONE (DELTASONE) 50 MG tablet Take 1 tablet (50 mg total) by mouth daily with breakfast. 5 tablet Ok Edwards, PA-C     PDMP not reviewed this encounter.   Ok Edwards, PA-C 08/13/19 1429

## 2019-08-13 NOTE — Discharge Instructions (Signed)
Prednisone and doxycycline as directed. Over the counter flonase if needed. Keep hydrated, your urine should be clear to pale yellow in color. Tylenol/motrin for fever and pain. Monitor for any worsening of symptoms, chest pain, shortness of breath, wheezing, swelling of the throat, go to the emergency department for further evaluation needed.

## 2019-08-13 NOTE — ED Triage Notes (Signed)
Pt presents with sinus congestion, clear nasal drainage, cough and feeling fatigued for 2 weeks.  No COVID vaccine, and husband tested negative x 5 days ago.

## 2019-08-25 ENCOUNTER — Other Ambulatory Visit: Payer: Self-pay

## 2019-08-25 ENCOUNTER — Ambulatory Visit (INDEPENDENT_AMBULATORY_CARE_PROVIDER_SITE_OTHER): Payer: 59

## 2019-08-25 ENCOUNTER — Ambulatory Visit
Admission: RE | Admit: 2019-08-25 | Discharge: 2019-08-25 | Disposition: A | Discharge status: Home / Self Care | Payer: 59 | Source: Ambulatory Visit | Attending: Physician Assistant | Admitting: Physician Assistant

## 2019-08-25 VITALS — BP 150/83 | HR 68 | Temp 97.9°F | Resp 18

## 2019-08-25 DIAGNOSIS — R05 Cough: Secondary | ICD-10-CM

## 2019-08-25 DIAGNOSIS — J209 Acute bronchitis, unspecified: Secondary | ICD-10-CM | POA: Diagnosis not present

## 2019-08-25 MED ORDER — ALBUTEROL SULFATE HFA 108 (90 BASE) MCG/ACT IN AERS: 1.0000 | INHALATION_SPRAY | Freq: Four times a day (QID) | RESPIRATORY_TRACT | 0 refills | Status: DC | PRN

## 2019-08-25 MED ORDER — HYDROCODONE-HOMATROPINE 5-1.5 MG/5ML PO SYRP: 5.0000 mL | ORAL_SOLUTION | Freq: Four times a day (QID) | ORAL | 0 refills | Status: DC | PRN

## 2019-08-25 MED ORDER — PREDNISONE 50 MG PO TABS: 50.0000 mg | ORAL_TABLET | Freq: Every day | ORAL | 0 refills | Status: DC

## 2019-08-25 NOTE — Discharge Instructions (Signed)
Chest xray negative for pneumonia. Start prednisone. Albtuerol as needed for coughing fits. Hycodan as needed for cough. Follow up with PCP if symptoms not improving.

## 2019-08-25 NOTE — ED Triage Notes (Addendum)
Patient seen recently and continues to have URI symptoms, but states cough is worse, she is getting white frothy sputum up, and her chest and back hurt all the time.  Pt c/o bilateral leg swelling beginning last weekend.

## 2019-08-25 NOTE — ED Provider Notes (Signed)
[facility]    CSN: [id number] Arrival date & time: 08/25/19  1639      History   Chief Complaint Chief Complaint  Patient presents with  . URI    HPI Angela Ramsey is a 54 y.o. female.   54 year old female comes in for continued cough after being seen 08/13/2019. At the time, was treated for sinusitis/bronchitis with doxycycline, prednisone. Sinus symptoms resolved, and cough improved initially, but worsened 1 week ago. Now with coughing fits, worse at night. Denies fever, shortness of breath. Chest soreness with cough. Denies orthopnea. Leg swelling that improves over night, and worsens throughout the day.      Past Medical History:  Diagnosis Date  . Anxiety   . Endometriosis   . Heart murmur    as a child  . 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 Vitamin D insufficiency 05/02/2019  . Insomnia 05/02/2019  . obesity ([place]) 05/02/2019  . Eczema 05/02/2019  . Unspecified menopausal and perimenopausal disorder 05/18/2018  . Hyperlipidemia 05/18/2018  . 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 Strong Family history of breast cancer in females 04/26/2018  . Genital herpes 02/24/2016  . Hemorrhoids- small external nonthrombosed hemorrhoid perirectal area at 10 PM 02/24/2016  . External hemorrhoids without complication 08/09/7626  . Screening for multiple conditions 12/31/2015  . Leukocytosis 12/31/2015  . Environmental and seasonal allergies 12/01/2015  . BMI 37.0-37.9, adult 10/16/2015  . GAD (generalized anxiety disorder) 10/16/2015  . Family history of early CAD 10/16/2015  . Nonspecific chest pain 10/16/2015  . Genetic testing 09/21/2014  . Family history of breast cancer   . Family history of thyroid cancer   . h/o ovarian Endometriosis     Past Surgical History:  Procedure Laterality  Date  . BREAST BIOPSY Left 2016  . BREAST SURGERY     Breast Bx benign  . FOOT SURGERY Left    plantar fasciitis  . NOVASURE ABLATION  10/2004  . PELVIC LAPAROSCOPY  12/2002   endometriosis    OB History    Gravida  2   Para  2   Term  2   Preterm      AB      Living  2     SAB      TAB      Ectopic      Multiple      Live Births               Home Medications    Prior to Admission medications   Medication Sig Start Date End Date Taking? Authorizing Provider  albuterol (VENTOLIN HFA) 108 (90 Base) MCG/ACT inhaler Inhale 1-2 puffs into the lungs every 6 (six) hours as needed for wheezing or shortness of breath. 08/25/19   Ok Edwards, PA-C  Cholecalciferol (VITAMIN D) 50 MCG (2000 UT) tablet Take 1 tablet (2,000 Units total) by mouth daily. 12/06/18   Opalski, Neoma Laming, DO  FLUoxetine (PROZAC) 20 MG capsule Take 1 capsule (20 mg total) by mouth daily. 01/03/19   Fontaine, Belinda Block, MD  HYDROcodone-homatropine (HYCODAN) 5-1.5 MG/5ML syrup Take 5 mLs by mouth every 6 (six) hours as needed for cough. 08/25/19   Tasia Catchings, Ladaysha Soutar V, PA-C  predniSONE (DELTASONE) 50 MG tablet Take 1 tablet (50 mg total) by mouth daily with  breakfast. 08/25/19   Tasia Catchings, Locklyn Henriquez V, PA-C  traZODone (DESYREL) 100 MG tablet Take 0.5-1 tablets (50-100 mg total) by mouth at bedtime as needed for sleep. 05/02/19   Opalski, Neoma Laming, DO  triamcinolone (KENALOG) 0.025 % ointment Apply 1 application topically at bedtime. 04/17/19   Ok Edwards, PA-C  triamcinolone cream (KENALOG) 0.1 %  02/27/19   [provider]    Family History Family History  Problem Relation Age of Onset  . Hypertension Father   . Hyperlipidemia Father   . Stroke Father   . Breast cancer Maternal Aunt 67       mother's identical twin  . Cancer Maternal Aunt        breast  . Cancer Maternal Grandfather        lung  . Diabetes Paternal Grandmother   . Breast cancer Maternal Grandmother        dx in her late 60s to early 37s  . Cancer  Maternal Grandmother        breast  . Breast cancer Maternal Aunt 49       second cancer at  71  . Cancer Maternal Aunt        breast x 2  . Leukemia Paternal Grandfather   . Cancer Paternal Grandfather        leukemia  . Hypertension Mother   . Thyroid cancer Sister        dx in her early to mid 15s  . Cancer Sister        thyroid  . Breast cancer Paternal Aunt 86  . Colon cancer Neg Hx   . Esophageal cancer Neg Hx   . Rectal cancer Neg Hx   . Stomach cancer Neg Hx     Social History Social History   Tobacco Use  . Smoking status: Never Smoker  . Smokeless tobacco: Never Used  Vaping Use  . Vaping Use: Never used  Substance Use Topics  . Alcohol use: Yes    Alcohol/week: 1.0 standard drink    Types: 1 Glasses of wine per week  . Drug use: No     Allergies   Patient has no known allergies.   Review of Systems Review of Systems  Reason unable to perform ROS: See HPI as above.     Physical Exam Triage Vital Signs ED Triage Vitals  Enc Vitals Group     BP 08/25/19 1656 (!) 150/83     Pulse Rate 08/25/19 1656 68     Resp 08/25/19 1656 18     Temp 08/25/19 1656 97.9 F (36.6 C)     Temp Source 08/25/19 1656 Oral     SpO2 08/25/19 1656 96 %     Weight --      Height --      Head Circumference --      Peak Flow --      Pain Score 08/25/19 1657 8     Pain Loc --      Pain Edu? --      Excl. in [place]? --    No data found.  Updated Vital Signs BP (!) 150/83 (BP Location: Left Arm)   Pulse 68   Temp 97.9 F (36.6 C) (Oral)   Resp 18   LMP 12/25/2015 (Exact Date)   SpO2 96%   Physical Exam Constitutional:      General: She is not in acute distress.    Appearance: Normal appearance. She is not ill-appearing, toxic-appearing or diaphoretic.  HENT:     Head: Normocephalic and atraumatic.     Mouth/Throat:     Mouth: Mucous membranes are moist.     Pharynx: Oropharynx is clear. Uvula midline.  Cardiovascular:     Rate and Rhythm: Normal rate and  regular rhythm.     Heart sounds: Normal heart sounds. No murmur heard.  No friction rub. No gallop.   Pulmonary:     Effort: Pulmonary effort is normal. No accessory muscle usage, prolonged expiration, respiratory distress or retractions.     Comments: Lungs clear to auscultation without adventitious lung sounds. Musculoskeletal:     Cervical back: Normal range of motion and neck supple.     Comments: Bilateral 1+ pitting edema. No erythema, warmth. No tenderness to palpation  Skin:    General: Skin is warm and dry.  Neurological:     General: No focal deficit present.     Mental Status: She is alert and oriented to person, place, and time.      UC Treatments / Results  Labs (all labs ordered are listed, but only abnormal results are displayed) Labs Reviewed - No data to display  EKG   Radiology DG Chest 2 View  Result Date: 08/25/2019 CLINICAL DATA:  Cough for 3 weeks EXAM: CHEST - 2 VIEW COMPARISON:  09/26/2015 FINDINGS: No focal opacity or pleural effusion. Borderline heart size. No pneumothorax. IMPRESSION: No active cardiopulmonary disease. Electronically Signed   By: Donavan Foil M.D.   On: 08/25/2019 17:33    Procedures Procedures (including critical care time)  Medications Ordered in UC Medications - No data to display  Initial Impression / Assessment and Plan / UC Course  I have reviewed the triage vital signs and the nursing notes.  Pertinent labs & imaging results that were available during my care of the patient were reviewed by me and considered in my medical decision making (see chart for details).    CXR negative for active cardiopulmonary disease. Will repeat prednisone course and add albuterol for reactive airway. Hycodan as needed. To follow up with PCP if symptoms not improving. Return precautions given.  Final Clinical Impressions(s) / UC Diagnoses   Final diagnoses:  Acute bronchitis, unspecified organism   ED Prescriptions    Medication Sig  Dispense Auth. Provider   albuterol (VENTOLIN HFA) 108 (90 Base) MCG/ACT inhaler Inhale 1-2 puffs into the lungs every 6 (six) hours as needed for wheezing or shortness of breath. 8 g Amaiah Cristiano V, PA-C   HYDROcodone-homatropine (HYCODAN) 5-1.5 MG/5ML syrup Take 5 mLs by mouth every 6 (six) hours as needed for cough. 120 mL Mc Bloodworth V, PA-C   predniSONE (DELTASONE) 50 MG tablet Take 1 tablet (50 mg total) by mouth daily with breakfast. 5 tablet Tasia Catchings, Alazay Leicht V, PA-C     I have reviewed the PDMP during this encounter.   Ok Edwards, PA-C 08/25/19 1802

## 2019-08-25 NOTE — ED Notes (Signed)
Patient able to ambulate independently  

## 2019-09-11 ENCOUNTER — Other Ambulatory Visit: Payer: Self-pay

## 2019-09-11 ENCOUNTER — Ambulatory Visit
Admission: RE | Admit: 2019-09-11 | Discharge: 2019-09-11 | Disposition: A | Discharge status: Home / Self Care | Payer: 59 | Source: Ambulatory Visit | Attending: Family Medicine | Admitting: Family Medicine

## 2019-09-11 ENCOUNTER — Telehealth: Payer: Self-pay | Admitting: Family Medicine

## 2019-09-11 VITALS — BP 150/87 | HR 80 | Temp 98.0°F | Resp 18

## 2019-09-11 DIAGNOSIS — R05 Cough: Secondary | ICD-10-CM | POA: Diagnosis not present

## 2019-09-11 DIAGNOSIS — R059 Cough, unspecified: Secondary | ICD-10-CM

## 2019-09-11 MED ORDER — CETIRIZINE HCL 10 MG PO TABS: 10.0000 mg | ORAL_TABLET | Freq: Every day | ORAL | 0 refills | Status: DC

## 2019-09-11 MED ORDER — FLUTICASONE PROPIONATE 50 MCG/ACT NA SUSP: 1.0000 | Freq: Every day | NASAL | 2 refills | Status: DC

## 2019-09-11 MED ORDER — HYDROCODONE-HOMATROPINE 5-1.5 MG/5ML PO SYRP: 5.0000 mL | ORAL_SOLUTION | Freq: Four times a day (QID) | ORAL | 0 refills | Status: DC | PRN

## 2019-09-11 NOTE — ED Triage Notes (Signed)
Pt presents to [facility] for assessment of continued cough, nasal congestion, bilateral ear pain, tickle in her throat for weeks now.  Patient states when she is on medications (mostly prednisone) it improves, but within 24 hours of stopping prednisone, it gets worse, and seems to be getting worse this time than normal.

## 2019-09-11 NOTE — Telephone Encounter (Signed)
Resending cough medicine to different [facility]

## 2019-09-11 NOTE — Discharge Instructions (Addendum)
Zyrtec and Flonase daily for allergy symptoms. Cough syrup as needed Follow up as needed for continued or worsening symptoms

## 2019-09-12 NOTE — ED Provider Notes (Signed)
[facility]    CSN: [id number] Arrival date & time: 09/11/19  1148      History   Chief Complaint Chief Complaint  Patient presents with  . URI    HPI Cam Harnden Kizer is a 54 y.o. female.   Patient is a 54 year old female who presents today with continued dry cough, nasal congestion, bilateral ear pain, tickle in throat for weeks.  She has been treated for this multiple times to include prednisone and feels better with the medication but the symptoms return.  Worse is coughing at night and unable to get rest.  She is concerned due to soon upcoming travel for her son's wedding.  She does have a history of seasonal allergies.  No fever, chills, body aches.  Was seen on 6/27 and 7/9.  Was seen ROS per HPI      Past Medical History:  Diagnosis Date  . Anxiety   . Endometriosis   . Heart murmur    as a child  . 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 Vitamin D insufficiency 05/02/2019  . Insomnia 05/02/2019  . obesity ([place]) 05/02/2019  . Eczema 05/02/2019  . Unspecified menopausal and perimenopausal disorder 05/18/2018  . Hyperlipidemia 05/18/2018  . 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 Strong Family history of breast cancer in females 04/26/2018  . Genital herpes 02/24/2016  . Hemorrhoids- small external nonthrombosed hemorrhoid perirectal area at 10 PM 02/24/2016  . External hemorrhoids without complication 19/62/2297  . Screening for multiple conditions 12/31/2015  . Leukocytosis 12/31/2015  . Environmental and seasonal allergies 12/01/2015  . BMI 37.0-37.9, adult 10/16/2015  . GAD (generalized anxiety disorder) 10/16/2015  . Family history of early CAD 10/16/2015  . Nonspecific chest pain 10/16/2015  . Genetic testing 09/21/2014  . Family history of breast cancer   . Family history of  thyroid cancer   . h/o ovarian Endometriosis     Past Surgical History:  Procedure Laterality Date  . BREAST BIOPSY Left 2016  . BREAST SURGERY     Breast Bx benign  . FOOT SURGERY Left    plantar fasciitis  . NOVASURE ABLATION  10/2004  . PELVIC LAPAROSCOPY  12/2002   endometriosis    OB History    Gravida  2   Para  2   Term  2   Preterm      AB      Living  2     SAB      TAB      Ectopic      Multiple      Live Births               Home Medications    Prior to Admission medications   Medication Sig Start Date End Date Taking? Authorizing Provider  albuterol (VENTOLIN HFA) 108 (90 Base) MCG/ACT inhaler Inhale 1-2 puffs into the lungs every 6 (six) hours as needed for wheezing or shortness of breath. 08/25/19   Tasia Catchings, Amy V, PA-C  cetirizine (ZYRTEC) 10 MG tablet Take 1 tablet (10 mg total) by mouth daily. 09/11/19   Loura Halt A, NP  Cholecalciferol (VITAMIN D) 50 MCG (2000 UT) tablet Take 1 tablet (2,000 Units total) by mouth daily. 12/06/18   Opalski, Deborah, DO  FLUoxetine (PROZAC) 20 MG capsule Take 1 capsule (20  mg total) by mouth daily. 01/03/19   Fontaine, Belinda Block, MD  fluticasone (FLONASE) 50 MCG/ACT nasal spray Place 1 spray into both nostrils daily. 09/11/19   Alysiana Ethridge, Tressia Miners A, NP  HYDROcodone-homatropine (HYCODAN) 5-1.5 MG/5ML syrup Take 5 mLs by mouth every 6 (six) hours as needed for cough. 09/11/19   Loura Halt A, NP  predniSONE (DELTASONE) 50 MG tablet Take 1 tablet (50 mg total) by mouth daily with breakfast. 08/25/19   Tasia Catchings, Amy V, PA-C  traZODone (DESYREL) 100 MG tablet Take 0.5-1 tablets (50-100 mg total) by mouth at bedtime as needed for sleep. 05/02/19   Opalski, Neoma Laming, DO  triamcinolone (KENALOG) 0.025 % ointment Apply 1 application topically at bedtime. 04/17/19   Ok Edwards, PA-C  triamcinolone cream (KENALOG) 0.1 %  02/27/19   [provider]    Family History Family History  Problem Relation Age of Onset  . Hypertension Father    . Hyperlipidemia Father   . Stroke Father   . Breast cancer Maternal Aunt 67       mother's identical twin  . Cancer Maternal Aunt        breast  . Cancer Maternal Grandfather        lung  . Diabetes Paternal Grandmother   . Breast cancer Maternal Grandmother        dx in her late 23s to early 1s  . Cancer Maternal Grandmother        breast  . Breast cancer Maternal Aunt 49       second cancer at  41  . Cancer Maternal Aunt        breast x 2  . Leukemia Paternal Grandfather   . Cancer Paternal Grandfather        leukemia  . Hypertension Mother   . Thyroid cancer Sister        dx in her early to mid 51s  . Cancer Sister        thyroid  . Breast cancer Paternal Aunt 86  . Colon cancer Neg Hx   . Esophageal cancer Neg Hx   . Rectal cancer Neg Hx   . Stomach cancer Neg Hx     Social History Social History   Tobacco Use  . Smoking status: Never Smoker  . Smokeless tobacco: Never Used  Vaping Use  . Vaping Use: Never used  Substance Use Topics  . Alcohol use: Yes    Alcohol/week: 1.0 standard drink    Types: 1 Glasses of wine per week  . Drug use: No     Allergies   Patient has no known allergies.   Review of Systems Review of Systems   Physical Exam Triage Vital Signs ED Triage Vitals  Enc Vitals Group     BP 09/11/19 1200 (!) 150/87     Pulse Rate 09/11/19 1200 80     Resp 09/11/19 1200 18     Temp 09/11/19 1200 98 F (36.7 C)     Temp Source 09/11/19 1200 Oral     SpO2 09/11/19 1200 97 %     Weight --      Height --      Head Circumference --      Peak Flow --      Pain Score 09/11/19 1212 3     Pain Loc --      Pain Edu? --      Excl. in [place]? --    No data found.  Updated Vital Signs  BP (!) 150/87 (BP Location: Left Arm)   Pulse 80   Temp 98 F (36.7 C) (Oral)   Resp 18   LMP 12/25/2015 (Exact Date)   SpO2 97%   Visual Acuity Right Eye Distance:   Left Eye Distance:   Bilateral Distance:    Right Eye Near:   Left Eye Near:     Bilateral Near:     Physical Exam Vitals and nursing note reviewed.  Constitutional:      General: She is not in acute distress.    Appearance: Normal appearance. She is not ill-appearing, toxic-appearing or diaphoretic.  HENT:     Head: Normocephalic.     Right Ear: Tympanic membrane and ear canal normal.     Left Ear: Tympanic membrane and ear canal normal.     Nose: Nose normal.     Mouth/Throat:     Pharynx: Oropharynx is clear.  Eyes:     Conjunctiva/sclera: Conjunctivae normal.  Cardiovascular:     Rate and Rhythm: Normal rate and regular rhythm.  Pulmonary:     Effort: Pulmonary effort is normal.     Breath sounds: Normal breath sounds.  Musculoskeletal:        General: Normal range of motion.     Cervical back: Normal range of motion.  Skin:    General: Skin is warm and dry.     Findings: No rash.  Neurological:     Mental Status: She is alert.  Psychiatric:        Mood and Affect: Mood normal.      UC Treatments / Results  Labs (all labs ordered are listed, but only abnormal results are displayed) Labs Reviewed - No data to display  EKG   Radiology No results found.  Procedures Procedures (including critical care time)  Medications Ordered in UC Medications - No data to display  Initial Impression / Assessment and Plan / UC Course  I have reviewed the triage vital signs and the nursing notes.  Pertinent labs & imaging results that were available during my care of the patient were reviewed by me and considered in my medical decision making (see chart for details).     Cough Most likely from allergies Will have her restart the zyrtec and Flonase Cough medicine as needed.  Follow up as needed for continued or worsening symptoms  Final Clinical Impressions(s) / UC Diagnoses   Final diagnoses:  Cough     Discharge Instructions     Zyrtec and Flonase daily for allergy symptoms. Cough syrup as needed Follow up as needed for continued or  worsening symptoms     ED Prescriptions    Medication Sig Dispense Auth. Provider   HYDROcodone-homatropine (HYCODAN) 5-1.5 MG/5ML syrup Take 5 mLs by mouth every 6 (six) hours as needed for cough. 120 mL Natayla Cadenhead A, NP   fluticasone (FLONASE) 50 MCG/ACT nasal spray Place 1 spray into both nostrils daily. 16 g Laasia Arcos A, NP   cetirizine (ZYRTEC) 10 MG tablet Take 1 tablet (10 mg total) by mouth daily. 30 tablet Loura Halt A, NP     PDMP not reviewed this encounter.   Loura Halt A, NP 09/12/19 1519

## 2019-09-24 ENCOUNTER — Other Ambulatory Visit: Payer: Self-pay | Admitting: Family Medicine

## 2019-09-24 DIAGNOSIS — F411 Generalized anxiety disorder: Secondary | ICD-10-CM

## 2019-09-24 DIAGNOSIS — G4700 Insomnia, unspecified: Secondary | ICD-10-CM

## 2019-10-12 ENCOUNTER — Encounter: Payer: Self-pay | Admitting: Physician Assistant

## 2019-10-12 DIAGNOSIS — F411 Generalized anxiety disorder: Secondary | ICD-10-CM

## 2019-10-12 DIAGNOSIS — G4700 Insomnia, unspecified: Secondary | ICD-10-CM

## 2019-10-12 MED ORDER — TRAZODONE HCL 100 MG PO TABS: 50.0000 mg | ORAL_TABLET | Freq: Every evening | ORAL | 0 refills | Status: DC | PRN

## 2020-01-04 ENCOUNTER — Ambulatory Visit (INDEPENDENT_AMBULATORY_CARE_PROVIDER_SITE_OTHER): Payer: 59 | Admitting: Physician Assistant

## 2020-01-04 ENCOUNTER — Encounter: Payer: Self-pay | Admitting: Physician Assistant

## 2020-01-04 ENCOUNTER — Other Ambulatory Visit: Payer: Self-pay

## 2020-01-04 VITALS — BP 122/86 | HR 68 | Temp 98.8°F | Ht 60.0 in | Wt 207.4 lb

## 2020-01-04 DIAGNOSIS — E785 Hyperlipidemia, unspecified: Secondary | ICD-10-CM | POA: Diagnosis not present

## 2020-01-04 DIAGNOSIS — G4700 Insomnia, unspecified: Secondary | ICD-10-CM

## 2020-01-04 DIAGNOSIS — Z0000 Encounter for general adult medical examination without abnormal findings: Secondary | ICD-10-CM

## 2020-01-04 DIAGNOSIS — E559 Vitamin D deficiency, unspecified: Secondary | ICD-10-CM | POA: Diagnosis not present

## 2020-01-04 DIAGNOSIS — H60502 Unspecified acute noninfective otitis externa, left ear: Secondary | ICD-10-CM

## 2020-01-04 DIAGNOSIS — Z6841 Body Mass Index (BMI) 40.0 and over, adult: Secondary | ICD-10-CM

## 2020-01-04 DIAGNOSIS — D72829 Elevated white blood cell count, unspecified: Secondary | ICD-10-CM

## 2020-01-04 DIAGNOSIS — F411 Generalized anxiety disorder: Secondary | ICD-10-CM

## 2020-01-04 DIAGNOSIS — R5382 Chronic fatigue, unspecified: Secondary | ICD-10-CM

## 2020-01-04 DIAGNOSIS — Z2821 Immunization not carried out because of patient refusal: Secondary | ICD-10-CM

## 2020-01-04 MED ORDER — OFLOXACIN 0.3 % OT SOLN: OTIC | 0 refills | Status: DC

## 2020-01-04 MED ORDER — TRAZODONE HCL 100 MG PO TABS: 50.0000 mg | ORAL_TABLET | Freq: Every evening | ORAL | 0 refills | Status: DC | PRN

## 2020-01-04 MED ORDER — DEXAMETHASONE SODIUM PHOSPHATE 0.1 % OP SOLN: OPHTHALMIC | 0 refills | Status: DC

## 2020-01-04 MED ORDER — CIPROFLOXACIN-DEXAMETHASONE 0.3-0.1 % OT SUSP: OTIC | 0 refills | Status: DC

## 2020-01-04 NOTE — Progress Notes (Signed)
Female Physical   Impression and Recommendations:    1. Healthcare maintenance   2. Leukocytosis, unspecified type   3. Hyperlipidemia, unspecified hyperlipidemia type   4. Vitamin D insufficiency   5. Chronic fatigue   6. Acute otitis externa of left ear, unspecified type   7. BMI 40.0-44.9, adult ([place])   8. Influenza vaccination declined   9. GAD (generalized anxiety disorder)   10. Insomnia, unspecified type      1) Anticipatory Guidance: Discussed skin CA prevention and sunscreen when outside along with skin surveillance; eating a balanced and modest diet; physical activity at least 25 minutes per day or minimum of 150 min/ week moderate to intense activity. -Encourage to start with exercising 10-15 minutes and gradually increase to ultimately 30-45 minutes.  2) Immunizations / Screenings / Labs:   All immunizations are up-to-date per recommendations or will be updated today if pt allows.    - Patient understands with dental and vision screens they will schedule independently.  - Will obtain CBC, CMP, HgA1c, Lipid panel, TSH and vit D when fasting. - UTD on mammogram, Pap smear, colonoscopy, Tdap, hep C and HIV screenings. Completed Shingrix series. - Declined influenza vaccine  3) Weight: Discussed goal to improve diet habits to improve overall feelings of well being and objective health data. Improve nutrient density of diet through increasing intake of fruits and vegetables and decreasing saturated fats, white flour products and refined sugars.  4) Healthcare Maintenance:  -Sent Ciprodex for acute otitis externa but due to cost changed to ofloxacin and dexamethasone ophthalmic drops.  Advised patient if symptoms fail to improve or worsened to let me know and will consider oral antibiotic.  Patient verbalized understanding and agreeable. -Follow a heart healthy diet and stay well-hydrated. -Recommend to try topical anti-inflammatory such as diclofenac gel for knee  pain. -Follow-up in 6 months for insomnia and mood (Prozac managed by OB/GYN).   Meds ordered this encounter  Medications  . DISCONTD: ciprofloxacin-dexamethasone (CIPRODEX) OTIC suspension    Sig: Place 4 drops into the left ear two times daily x 7 days.    Dispense:  7.5 mL    Refill:  0    Order Specific Question:   Supervising Provider    Answer:   Beatrice Lecher D [[id number]]  . ofloxacin (FLOXIN OTIC) 0.3 % OTIC solution    Sig: Place 10 drops into the left ear daily x 7 days.    Dispense:  5 mL    Refill:  0    Order Specific Question:   Supervising Provider    Answer:   Beatrice Lecher D [[id number]]  . dexamethasone (DECADRON) 0.1 % ophthalmic solution    Sig: Place 3-4 drops into left ear twice daily.    Dispense:  5 mL    Refill:  0    Order Specific Question:   Supervising Provider    Answer:   Beatrice Lecher D [[id number]]  . traZODone (DESYREL) 100 MG tablet    Sig: Take 0.5-1 tablets (50-100 mg total) by mouth at bedtime as needed for sleep.    Dispense:  60 tablet    Refill:  0    Order Specific Question:   Supervising Provider    Answer:   Beatrice Lecher D [[id number]]    Orders Placed This Encounter  Procedures  . CBC with Differential/Platelet  . Comprehensive metabolic panel  . Hemoglobin A1c  . Lipid panel  . TSH  . VITAMIN D 25 Hydroxy (Vit-D Deficiency, Fractures)  Return in about 6 months (around 07/03/2020) for Insomnia, mood .      Gross side effects, risk and benefits, and alternatives of medications discussed with patient.  Patient is aware that all medications have potential side effects and we are unable to predict every side effect or drug-drug interaction that may occur.  Expresses verbal understanding and consents to current therapy plan and treatment regimen.  F-up preventative CPE in 1 year-this is in addition to any chronic care visits.    Please see orders placed and AVS handed out to patient at the end of our visit for further  patient instructions/ counseling done pertaining to today's office visit.  Note:  This note was prepared with assistance of Dragon voice recognition software. Occasional wrong-word or sound-a-like substitutions may have occurred due to the inherent limitations of voice recognition software.    Subjective:     CPE HPI: Angela Ramsey is a 54 y.o. female who presents to [facility] today a yearly health maintenance exam.   Health Maintenance Summary  - Reviewed and updated, unless pt declines services.  Last Cologuard or Colonoscopy:    07/28/2018-repeat in 3 years Tobacco History Reviewed:  Y, Alcohol and/or drug use:    No concerns; no use Exercise Habits: Currently not active. Dental Home:Y Eye exams:Y Dermatology home:Y  Female Health:  PAP Smear - last known results: 01/03/2019- neg STD concerns:   none Menses regular:   Postmenopausal  lumps or breast concerns: none Breast Cancer Family History:  Yes   Additional concerns beyond health maintenance issues: Left ear swelling, pain and itchy x few days.    Immunization History  Administered Date(s) Administered  . Influenza,inj,Quad PF,6+ Mos 12/30/2015, 12/05/2018  . Tdap 12/30/2015  . Zoster Recombinat (Shingrix) 12/05/2018, 03/03/2019     Health Maintenance  Topic Date Due  . COVID-19 Vaccine (1) Never done  . INFLUENZA VACCINE  09/17/2019  . MAMMOGRAM  04/09/2020  . COLONOSCOPY  07/27/2021  . PAP SMEAR-Modifier  01/02/2022  . TETANUS/TDAP  12/29/2025  . Hepatitis C Screening  Completed  . HIV Screening  Completed     Wt Readings from Last 3 Encounters:  01/04/20 207 lb 6.4 oz (94.1 kg)  05/02/19 212 lb 3.2 oz (96.3 kg)  01/03/19 202 lb (91.6 kg)   BP Readings from Last 3 Encounters:  01/04/20 122/86  09/11/19 (!) 150/87  08/25/19 (!) 150/83   Pulse Readings from Last 3 Encounters:  01/04/20 68  09/11/19 80  08/25/19 68     Past Medical History:  Diagnosis  Date  . Anxiety   . Endometriosis   . Heart murmur    as a child  . 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 BREAST BIOPSY Left 2016  . BREAST SURGERY     Breast Bx benign  . FOOT SURGERY Left    plantar fasciitis  . NOVASURE ABLATION  10/2004  . PELVIC LAPAROSCOPY  12/2002   endometriosis      Family History  Problem Relation Age of Onset  . Hypertension Father   . Hyperlipidemia Father   . Stroke Father   . Breast cancer Maternal Aunt 67       mother's identical twin  . Cancer Maternal Aunt        breast  .  Cancer Maternal Grandfather        lung  . Diabetes Paternal Grandmother   . Breast cancer Maternal Grandmother        dx in her late 15s to early 64s  . Cancer Maternal Grandmother        breast  . Breast cancer Maternal Aunt 49       second cancer at  55  . Cancer Maternal Aunt        breast x 2  . Leukemia Paternal Grandfather   . Cancer Paternal Grandfather        leukemia  . Hypertension Mother   . Thyroid cancer Sister        dx in her early to mid 17s  . Cancer Sister        thyroid  . Breast cancer Paternal Aunt 86  . Colon cancer Neg Hx   . Esophageal cancer Neg Hx   . Rectal cancer Neg Hx   . Stomach cancer Neg Hx       Social History   Substance and Sexual Activity  Drug Use No  ,   Social History   Substance and Sexual Activity  Alcohol Use Yes  . Alcohol/week: 1.0 standard drink  . Types: 1 Glasses of wine per week  ,   Social History   Tobacco Use  Smoking Status Never Smoker  Smokeless Tobacco Never Used  ,   Social History   Substance and Sexual Activity  Sexual Activity Yes  . Birth control/protection: Surgical   Comment: 1st intercourse 28 yo-5 partners-Vasectomy    Current Outpatient Medications on File Prior to Visit  Medication Sig Dispense  Refill  . Cholecalciferol (VITAMIN D) 50 MCG (2000 UT) tablet Take 1 tablet (2,000 Units total) by mouth daily.    Marland Kitchen FLUoxetine (PROZAC) 20 MG capsule Take 1 capsule (20 mg total) by mouth daily. 90 capsule 4  . fluticasone (FLONASE) 50 MCG/ACT nasal spray Place 1 spray into both nostrils daily. 16 g 2  . triamcinolone (KENALOG) 0.025 % ointment Apply 1 application topically at bedtime. 30 g 0  . triamcinolone cream (KENALOG) 0.1 %      No current facility-administered medications on file prior to visit.    Allergies: Patient has no known allergies.  Review of Systems: General:   Denies fever, chills, unexplained weight loss, +fatigue Optho/Auditory:   Denies visual changes, blurred vision/LOV Respiratory:   Denies SOB, DOE more than baseline levels.   Cardiovascular:   Denies chest pain, palpitations, new onset peripheral edema  Gastrointestinal:   Denies nausea, vomiting, diarrhea.  Genitourinary: Denies dysuria, freq/ urgency, flank pain  Endocrine:     Denies hot or cold intolerance, polyuria, polydipsia. Musculoskeletal:   Denies unexplained myalgias, joint swelling, unexplained arthralgias, gait problems.  Skin:  Denies rash, suspicious lesions Neurological:     Denies dizziness, unexplained weakness, numbness  Psychiatric/Behavioral:   Denies mood changes, suicidal or homicidal ideations, hallucinations    Objective:    Blood pressure 122/86, pulse 68, temperature 98.8 F (37.1 C), temperature source Oral, height 5' (1.524 m), weight 207 lb 6.4 oz (94.1 kg), last menstrual period 12/25/2015, SpO2 97 %. Body mass index is 40.51 kg/m. General Appearance:    Alert, cooperative, no distress, appears stated age  Head:    Normocephalic, without obvious abnormality, atraumatic  Eyes:    PERRL, conjunctiva/corneas clear, EOM's intact, both eyes  Ears:    Normal TM's and external ear canals, righ ear; Left external  ear canal swollen and tender, +Tragus and Pinna sign  Nose:   Nares  normal, septum midline, mucosa normal, no drainage    or sinus tenderness  Throat:   Lips w/o lesion, mucosa moist, and tongue normal; teeth and   gums normal  Neck:   Supple, symmetrical, trachea midline, no adenopathy;    thyroid:  no enlargement/tenderness/nodules; no carotid   bruit or JVD  Back:     Symmetric, no curvature, ROM normal, no CVA tenderness  Lungs:     Clear to auscultation bilaterally, respirations unlabored, no     Wh/ R/ R  Chest Wall:    No tenderness or gross deformity; normal excursion   Heart:    Regular rate and rhythm, S1 and S2 normal, no murmur, rub   or gallop  Breast Exam:    Deferred to Ob-Gyn  Abdomen:     Soft, non-tender, bowel sounds active all four quadrants, No  G/R/R, no masses, no organomegaly  Genitalia:    Deferred to Ob-Gyn  Rectal:    Deferred to Ob-Gyn  Extremities:   Extremities normal, atraumatic, no cyanosis or gross edema  Pulses:   2+ and symmetric all extremities  Skin:   Warm, dry, Skin color, texture, turgor normal, no obvious rashes or lesions Psych: No HI/SI, judgement and insight good, Euthymic mood. Full Affect.  Neurologic:   CNII-XII intact, normal strength, sensation and reflexes    Throughout

## 2020-01-04 NOTE — Patient Instructions (Signed)
Preventive Care 40-52 Years Old, Female °Preventive care refers to visits with your health care provider and lifestyle choices that can promote health and wellness. This includes: °· A yearly physical exam. This may also be called an annual well check. °· Regular dental visits and eye exams. °· Immunizations. °· Screening for certain conditions. °· Healthy lifestyle choices, such as eating a healthy diet, getting regular exercise, not using drugs or products that contain nicotine and tobacco, and limiting alcohol use. °What can I expect for my preventive care visit? °Physical exam °Your health care provider will check your: °· Height and weight. This may be used to calculate body mass index (BMI), which tells if you are at a healthy weight. °· Heart rate and blood pressure. °· Skin for abnormal spots. °Counseling °Your health care provider may ask you questions about your: °· Alcohol, tobacco, and drug use. °· Emotional well-being. °· Home and relationship well-being. °· Sexual activity. °· Eating habits. °· Work and work environment. °· Method of birth control. °· Menstrual cycle. °· Pregnancy history. °What immunizations do I need? ° °Influenza (flu) vaccine °· This is recommended every year. °Tetanus, diphtheria, and pertussis (Tdap) vaccine °· You may need a Td booster every 10 years. °Varicella (chickenpox) vaccine °· You may need this if you have not been vaccinated. °Zoster (shingles) vaccine °· You may need this after age 60. °Measles, mumps, and rubella (MMR) vaccine °· You may need at least one dose of MMR if you were born in 1957 or later. You may also need a second dose. °Pneumococcal conjugate (PCV13) vaccine °· You may need this if you have certain conditions and were not previously vaccinated. °Pneumococcal polysaccharide (PPSV23) vaccine °· You may need one or two doses if you smoke cigarettes or if you have certain conditions. °Meningococcal conjugate (MenACWY) vaccine °· You may need this if you  have certain conditions. °Hepatitis A vaccine °· You may need this if you have certain conditions or if you travel or work in places where you may be exposed to hepatitis A. °Hepatitis B vaccine °· You may need this if you have certain conditions or if you travel or work in places where you may be exposed to hepatitis B. °Haemophilus influenzae type b (Hib) vaccine °· You may need this if you have certain conditions. °Human papillomavirus (HPV) vaccine °· If recommended by your health care provider, you may need three doses over 6 months. °You may receive vaccines as individual doses or as more than one vaccine together in one shot (combination vaccines). Talk with your health care provider about the risks and benefits of combination vaccines. °What tests do I need? °Blood tests °· Lipid and cholesterol levels. These may be checked every 5 years, or more frequently if you are over 50 years old. °· Hepatitis C test. °· Hepatitis B test. °Screening °· Lung cancer screening. You may have this screening every year starting at age 55 if you have a 30-pack-year history of smoking and currently smoke or have quit within the past 15 years. °· Colorectal cancer screening. All adults should have this screening starting at age 50 and continuing until age 75. Your health care provider may recommend screening at age 45 if you are at increased risk. You will have tests every 1-10 years, depending on your results and the type of screening test. °· Diabetes screening. This is done by checking your blood sugar (glucose) after you have not eaten for a while (fasting). You may have this   done every 1-3 years.  Mammogram. This may be done every 1-2 years. Talk with your health care provider about when you should start having regular mammograms. This may depend on whether you have a family history of breast cancer.  BRCA-related cancer screening. This may be done if you have a family history of breast, ovarian, tubal, or peritoneal  cancers.  Pelvic exam and Pap test. This may be done every 3 years starting at age 76. Starting at age 89, this may be done every 5 years if you have a Pap test in combination with an HPV test. Other tests  Sexually transmitted disease (STD) testing.  Bone density scan. This is done to screen for osteoporosis. You may have this scan if you are at high risk for osteoporosis. Follow these instructions at home: Eating and drinking  Eat a diet that includes fresh fruits and vegetables, whole grains, lean protein, and low-fat dairy.  Take vitamin and mineral supplements as recommended by your health care provider.  Do not drink alcohol if: ? Your health care provider tells you not to drink. ? You are pregnant, may be pregnant, or are planning to become pregnant.  If you drink alcohol: ? Limit how much you have to 0-1 drink a day. ? Be aware of how much alcohol is in your drink. In the [place] one drink equals one 12 oz bottle of beer (355 mL), one 5 oz glass of wine (148 mL), or one 1 oz glass of hard liquor (44 mL). Lifestyle  Take daily care of your teeth and gums.  Stay active. Exercise for at least 30 minutes on 5 or more days each week.  Do not use any products that contain nicotine or tobacco, such as cigarettes, e-cigarettes, and chewing tobacco. If you need help quitting, ask your health care provider.  If you are sexually active, practice safe sex. Use a condom or other form of birth control (contraception) in order to prevent pregnancy and STIs (sexually transmitted infections).  If told by your health care provider, take low-dose aspirin daily starting at age 37. What's next?  Visit your health care provider once a year for a well check visit.  Ask your health care provider how often you should have your eyes and teeth checked.  Stay up to date on all vaccines. This information is not intended to replace advice given to you by your health care provider. Make sure you  discuss any questions you have with your health care provider. Document Revised: 10/14/2017 Document Reviewed: 10/14/2017 Elsevier Patient Education  2020 [employer].

## 2020-01-05 LAB — COMPREHENSIVE METABOLIC PANEL
ALT: 18 IU/L (ref 0–32)
AST: 21 IU/L (ref 0–40)
Albumin/Globulin Ratio: 1.7 (ref 1.2–2.2)
Albumin: 4.6 g/dL (ref 3.8–4.9)
Alkaline Phosphatase: 123 IU/L — ABNORMAL HIGH (ref 44–121)
BUN/Creatinine Ratio: 13 (ref 9–23)
BUN: 10 mg/dL (ref 6–24)
Bilirubin Total: 0.4 mg/dL (ref 0.0–1.2)
CO2: 25 mmol/L (ref 20–29)
Calcium: 9.5 mg/dL (ref 8.7–10.2)
Chloride: 102 mmol/L (ref 96–106)
Creatinine, Ser: 0.77 mg/dL (ref 0.57–1.00)
GFR calc Af Amer: 103 mL/min/{1.73_m2} (ref >59)
GFR calc non Af Amer: 89 mL/min/{1.73_m2} (ref >59)
Globulin, Total: 2.7 g/dL (ref 1.5–4.5)
Glucose: 83 mg/dL (ref 65–99)
Potassium: 4.2 mmol/L (ref 3.5–5.2)
Sodium: 142 mmol/L (ref 134–144)
Total Protein: 7.3 g/dL (ref 6.0–8.5)

## 2020-01-05 LAB — CBC WITH DIFFERENTIAL/PLATELET
Basophils Absolute: 0 10*3/uL (ref 0.0–0.2)
Basos: 0 %
EOS (ABSOLUTE): 0.1 10*3/uL (ref 0.0–0.4)
Eos: 1 %
Hematocrit: 43.4 % (ref 34.0–46.6)
Hemoglobin: 14.3 g/dL (ref 11.1–15.9)
Immature Grans (Abs): 0 10*3/uL (ref 0.0–0.1)
Immature Granulocytes: 0 %
Lymphocytes Absolute: 3.5 10*3/uL — ABNORMAL HIGH (ref 0.7–3.1)
Lymphs: 31 %
MCH: 29.1 pg (ref 26.6–33.0)
MCHC: 32.9 g/dL (ref 31.5–35.7)
MCV: 88 fL (ref 79–97)
Monocytes Absolute: 0.4 10*3/uL (ref 0.1–0.9)
Monocytes: 4 %
Neutrophils Absolute: 7.2 10*3/uL — ABNORMAL HIGH (ref 1.4–7.0)
Neutrophils: 64 %
Platelets: 386 10*3/uL (ref 150–450)
RBC: 4.92 x10E6/uL (ref 3.77–5.28)
RDW: 12.4 % (ref 11.7–15.4)
WBC: 11.4 10*3/uL — ABNORMAL HIGH (ref 3.4–10.8)

## 2020-01-05 LAB — LIPID PANEL
Chol/HDL Ratio: 3.8 ratio (ref 0.0–4.4)
Cholesterol, Total: 239 mg/dL — ABNORMAL HIGH (ref 100–199)
HDL: 63 mg/dL (ref >39)
LDL Chol Calc (NIH): 160 mg/dL — ABNORMAL HIGH (ref 0–99)
Triglycerides: 93 mg/dL (ref 0–149)
VLDL Cholesterol Cal: 16 mg/dL (ref 5–40)

## 2020-01-05 LAB — VITAMIN D 25 HYDROXY (VIT D DEFICIENCY, FRACTURES): Vit D, 25-Hydroxy: 43.6 ng/mL (ref 30.0–100.0)

## 2020-01-05 LAB — HEMOGLOBIN A1C
Est. average glucose Bld gHb Est-mCnc: 111 mg/dL
Hgb A1c MFr Bld: 5.5 % (ref 4.8–5.6)

## 2020-01-05 LAB — TSH: TSH: 2.69 u[IU]/mL (ref 0.450–4.500)

## 2020-01-10 ENCOUNTER — Other Ambulatory Visit: Payer: Self-pay

## 2020-01-10 ENCOUNTER — Ambulatory Visit (INDEPENDENT_AMBULATORY_CARE_PROVIDER_SITE_OTHER): Payer: 59 | Admitting: Obstetrics & Gynecology

## 2020-01-10 ENCOUNTER — Encounter: Payer: Self-pay | Admitting: Obstetrics & Gynecology

## 2020-01-10 VITALS — BP 126/82 | Ht 60.0 in | Wt 201.0 lb

## 2020-01-10 DIAGNOSIS — R300 Dysuria: Secondary | ICD-10-CM

## 2020-01-10 DIAGNOSIS — R21 Rash and other nonspecific skin eruption: Secondary | ICD-10-CM | POA: Diagnosis not present

## 2020-01-10 DIAGNOSIS — E6609 Other obesity due to excess calories: Secondary | ICD-10-CM

## 2020-01-10 DIAGNOSIS — Z6839 Body mass index (BMI) 39.0-39.9, adult: Secondary | ICD-10-CM | POA: Diagnosis not present

## 2020-01-10 DIAGNOSIS — Z803 Family history of malignant neoplasm of breast: Secondary | ICD-10-CM

## 2020-01-10 DIAGNOSIS — Z01419 Encounter for gynecological examination (general) (routine) without abnormal findings: Secondary | ICD-10-CM

## 2020-01-10 DIAGNOSIS — Z01411 Encounter for gynecological examination (general) (routine) with abnormal findings: Secondary | ICD-10-CM

## 2020-01-10 DIAGNOSIS — Z780 Asymptomatic menopausal state: Secondary | ICD-10-CM

## 2020-01-10 DIAGNOSIS — E66812 Obesity, class 2: Secondary | ICD-10-CM

## 2020-01-10 MED ORDER — NYSTATIN 100000 UNIT/GM EX POWD: 1.0000 "application " | Freq: Two times a day (BID) | CUTANEOUS | 3 refills | Status: DC

## 2020-01-10 NOTE — Progress Notes (Signed)
Angela Ramsey [birth year] [id number]   History:    54 y.o. G2P2002 Married  RP: Established patient presenting for annual gynecologic exam  HPI: Postmenopausal, well on no HRT.  [place] was 39 in June 2020. No PMB.  S/P Endometrial Ablation.  Applied Silver Nitrate on Cervix on 02/24/2019.  No longer having any post coital spotting.  No pelvic pain, occasional cramping.  Pap negative 12/2018, but no TZ cells.  C/O rash under the left axilla.  Breasts stable with a small mobile lump bilaterally.  Feb 2021 Dx mammo Negative.  Strong fam h/o Breast Ca, patient does MRI of breasts annually.  [place] 2020.  Health labs with Fam MD.  BMI 39.26.   Past medical history,surgical history, family history and social history were all reviewed and documented in the EPIC chart.  Gynecologic History Patient's last menstrual period was 12/25/2015 (exact date).  Obstetric History OB History  Gravida Para Term Preterm AB Living  2 2 2     2   SAB TAB Ectopic Multiple Live Births               # Outcome Date GA Lbr Len/2nd Weight Sex Delivery Anes PTL Lv  2 Term           1 Term              ROS: A ROS was performed and pertinent positives and negatives are included in the history.  GENERAL: No fevers or chills. HEENT: No change in vision, no earache, sore throat or sinus congestion. NECK: No pain or stiffness. CARDIOVASCULAR: No chest pain or pressure. No palpitations. PULMONARY: No shortness of breath, cough or wheeze. GASTROINTESTINAL: No abdominal pain, nausea, vomiting or diarrhea, melena or bright red blood per rectum. GENITOURINARY: No urinary frequency, urgency, hesitancy or dysuria. MUSCULOSKELETAL: No joint or muscle pain, no back pain, no recent trauma. DERMATOLOGIC: No rash, no itching, no lesions. ENDOCRINE: No polyuria, polydipsia, no heat or cold intolerance. No recent change in weight. HEMATOLOGICAL: No anemia or easy bruising or bleeding. NEUROLOGIC: No headache, seizures, numbness,  tingling or weakness. PSYCHIATRIC: No depression, no loss of interest in normal activity or change in sleep pattern.     Exam:   BP 126/82   Ht 5' (1.524 m)   Wt 201 lb (91.2 kg)   LMP 12/25/2015 (Exact Date)   BMI 39.26 kg/m   Body mass index is 39.26 kg/m.  General appearance : Well developed well nourished female. No acute distress HEENT: Eyes: no retinal hemorrhage or exudates,  Neck supple, trachea midline, no carotid bruits, no thyroidmegaly Lungs: Clear to auscultation, no rhonchi or wheezes, or rib retractions  Heart: Regular rate and rhythm, no murmurs or gallops Breast:Examined in sitting and supine position were symmetrical in appearance, no palpable masses or tenderness,  no skin retraction, no nipple inversion, no nipple discharge, no skin discoloration, no axillary or supraclavicular lymphadenopathy Abdomen: no palpable masses or tenderness, no rebound or guarding Extremities: no edema or skin discoloration or tenderness  Pelvic: Vulva: Normal             Vagina: No gross lesions or discharge  Cervix: No gross lesions or discharge.  Pap reflex done.  Uterus  AV, normal size, shape and consistency, non-tender and mobile  Adnexa  Without masses or tenderness  Anus: Normal  U/A: Yellow clear, nitrites negative, protein negative, white blood cells 6-10, red blood cells negative, few bacteria.  Urine culture pending.   Assessment/Plan:  54 y.o. female for annual exam   1. Encounter for routine gynecological examination with Papanicolaou smear of cervix Normal gynecologic exam in menopause.  Pap reflex done.  Breast exam with stable known small lumps.  Diagnostic mammogram was - February 2021.  Patient will repeat a mammogram in February 2022 and will proceed now with a bilateral MRI of the breast because of a strong family history of breast cancer.  Health labs with family physician.  Colonoscopy 2020.  2. Postmenopause Well on no hormone replacement therapy.  No  postmenopausal bleeding.  Vitamin D supplements, calcium intake of 1500 mg daily and regular weightbearing physical activity is recommended.  3. Skin rash Will try nystatin powder for possible yeast infection of the skin at the left inferior axilla.  Patient has already used corticosteroid creams without resolution of the rash.  4. Dysuria Urine analysis minimally perturbed.  Decision to wait on urine culture to decide on treatment.  5. Family history of breast cancer We will complete her breast evaluation with an MRI of the breast as she does annually.  6. Class 2 obesity due to excess calories without serious comorbidity with body mass index (BMI) of 39.0 to 39.9 in adult Recommend lower calorie/carb diet.  Aerobic activities 5 times a week and light weightlifting every 2 days.  Other orders - nystatin (MYCOSTATIN/NYSTOP) powder; Apply 1 application topically 2 (two) times daily.  Princess Bruins MD, 2:20 PM 01/10/2020

## 2020-01-10 NOTE — Addendum Note (Signed)
Addended by: Thurnell Garbe A on: 01/10/2020 03:34 PM   Modules accepted: Orders

## 2020-01-10 NOTE — Addendum Note (Signed)
Addended by: Thurnell Garbe A on: 01/10/2020 03:35 PM   Modules accepted: Orders

## 2020-01-12 LAB — URINALYSIS, COMPLETE W/RFL CULTURE
Bilirubin Urine: NEGATIVE
Glucose, UA: NEGATIVE
Hgb urine dipstick: NEGATIVE
Hyaline Cast: NONE SEEN /LPF
Ketones, ur: NEGATIVE
Nitrites, Initial: NEGATIVE
Protein, ur: NEGATIVE
RBC / HPF: NONE SEEN /HPF (ref 0–2)
Specific Gravity, Urine: 1.025 (ref 1.001–1.03)
pH: 6 (ref 5.0–8.0)

## 2020-01-12 LAB — URINE CULTURE
MICRO NUMBER:: 11244311
SPECIMEN QUALITY:: ADEQUATE

## 2020-01-12 LAB — CULTURE INDICATED

## 2020-01-15 ENCOUNTER — Telehealth: Payer: Self-pay | Admitting: *Deleted

## 2020-01-15 DIAGNOSIS — Z803 Family history of malignant neoplasm of breast: Secondary | ICD-10-CM

## 2020-01-15 LAB — PAP IG W/ RFLX HPV ASCU

## 2020-01-15 NOTE — Telephone Encounter (Signed)
-----   Message from Princess Bruins, MD sent at 01/10/2020  2:42 PM EST ----- Regarding: Strong fam h/o Breast Ca.  Patient does annual Breast MRI Strong fam h/o Breast Ca, patient does MRI of breasts annually.  Please schedule breast MRI.

## 2020-01-17 MED ORDER — FLUOXETINE HCL 40 MG PO CAPS: 40.0000 mg | ORAL_CAPSULE | Freq: Every day | ORAL | 3 refills | Status: DC

## 2020-01-17 NOTE — Telephone Encounter (Signed)
Patient scheduled on 02/04/20 @ 9:00am

## 2020-01-22 ENCOUNTER — Telehealth: Payer: Self-pay

## 2020-01-22 NOTE — Telephone Encounter (Signed)
I called and spoke with Angela Ramsey at [facility].  She informed me this plan does note require notification or prior authorization for CPT [phone].  Case # [id number]

## 2020-02-04 ENCOUNTER — Ambulatory Visit
Admission: RE | Admit: 2020-02-04 | Discharge: 2020-02-04 | Disposition: A | Discharge status: Home / Self Care | Payer: 59 | Source: Ambulatory Visit | Attending: Obstetrics & Gynecology | Admitting: Obstetrics & Gynecology

## 2020-02-04 DIAGNOSIS — Z803 Family history of malignant neoplasm of breast: Secondary | ICD-10-CM

## 2020-02-04 MED ORDER — GADOBUTROL 1 MMOL/ML IV SOLN
10.0000 mL | Freq: Once | INTRAVENOUS | Status: AC | PRN
  Administered 2020-02-04: 10 mL via INTRAVENOUS

## 2020-04-24 ENCOUNTER — Other Ambulatory Visit: Payer: Self-pay | Admitting: Obstetrics & Gynecology

## 2020-04-24 DIAGNOSIS — Z1231 Encounter for screening mammogram for malignant neoplasm of breast: Secondary | ICD-10-CM

## 2020-06-18 ENCOUNTER — Ambulatory Visit
Admission: RE | Admit: 2020-06-18 | Discharge: 2020-06-18 | Disposition: A | Discharge status: Home / Self Care | Payer: 59 | Source: Ambulatory Visit | Attending: Obstetrics & Gynecology | Admitting: Obstetrics & Gynecology

## 2020-06-18 ENCOUNTER — Other Ambulatory Visit: Payer: Self-pay

## 2020-06-18 DIAGNOSIS — Z1231 Encounter for screening mammogram for malignant neoplasm of breast: Secondary | ICD-10-CM

## 2020-07-03 ENCOUNTER — Encounter: Payer: Self-pay | Admitting: Physician Assistant

## 2020-07-03 ENCOUNTER — Other Ambulatory Visit: Payer: Self-pay

## 2020-07-03 ENCOUNTER — Ambulatory Visit (INDEPENDENT_AMBULATORY_CARE_PROVIDER_SITE_OTHER): Payer: 59 | Admitting: Physician Assistant

## 2020-07-03 VITALS — BP 134/85 | HR 64 | Temp 97.7°F | Ht 60.0 in | Wt 233.7 lb

## 2020-07-03 DIAGNOSIS — E559 Vitamin D deficiency, unspecified: Secondary | ICD-10-CM | POA: Diagnosis not present

## 2020-07-03 DIAGNOSIS — F411 Generalized anxiety disorder: Secondary | ICD-10-CM

## 2020-07-03 DIAGNOSIS — E785 Hyperlipidemia, unspecified: Secondary | ICD-10-CM

## 2020-07-03 DIAGNOSIS — R5383 Other fatigue: Secondary | ICD-10-CM

## 2020-07-03 DIAGNOSIS — M2550 Pain in unspecified joint: Secondary | ICD-10-CM

## 2020-07-03 DIAGNOSIS — H01004 Unspecified blepharitis left upper eyelid: Secondary | ICD-10-CM

## 2020-07-03 DIAGNOSIS — L299 Pruritus, unspecified: Secondary | ICD-10-CM

## 2020-07-03 DIAGNOSIS — Z0000 Encounter for general adult medical examination without abnormal findings: Secondary | ICD-10-CM | POA: Diagnosis not present

## 2020-07-03 DIAGNOSIS — H01001 Unspecified blepharitis right upper eyelid: Secondary | ICD-10-CM

## 2020-07-03 DIAGNOSIS — G4700 Insomnia, unspecified: Secondary | ICD-10-CM

## 2020-07-03 MED ORDER — ERYTHROMYCIN 5 MG/GM OP OINT: 1.0000 "application " | TOPICAL_OINTMENT | Freq: Every day | OPHTHALMIC | 1 refills | Status: DC

## 2020-07-03 MED ORDER — METHYLPREDNISOLONE ACETATE 40 MG/ML IJ SUSP
40.0000 mg | Freq: Once | INTRAMUSCULAR | Status: AC
  Administered 2020-07-03: 40 mg via INTRAMUSCULAR

## 2020-07-03 NOTE — Patient Instructions (Signed)

## 2020-07-03 NOTE — Progress Notes (Signed)
Established Patient Office Visit  Subjective:  Patient ID: Angela Ramsey, female    DOB: [birth year]  Age: 55 y.o. MRN: [id number]  CC:  Chief Complaint  Patient presents with  . Insomnia  . Depression    HPI [facility] presents for follow up on mood and insomnia. Patient has multiple c/o today.  Reports is itching all over her body which started over the weekend.  Denies new soaps, detergents or lifestyle changes.  No exposure to plants.  Has tried menthol-based cream which has been ineffective.  Also reports redness and itching of upper eyelids for the past 6 months.  States her eyes are watery in the mornings, normal matting at the corners of both eyes.  Has applied triamcinolone ointment which has provided minimal relief. Denies fever, chills, nasal congestion or headache.  Also reports has been feeling extremely tired lately.  Does not have the energy to exercise or do other activities around the house.  Has joint pain at multiple sites including low back bilateral knees and hands.  Reports in the mornings her hands can be stiff which gradually improves.  Denies erythema.  Knees sometimes swell.  Describes an incident in December 2022 for her left knee locked for about 3 hours which she treated conservatively.  Reports has not recurred.  Is concerned about potential underlying autoimmune condition.  Denies family history of autoimmune conditions.  Mood: Denies significant mood changes.  Reports has been feeling more tired but does not feel it is related to depression/mood.  Reports medication compliance.  Insomnia: States self discontinued trazodone because it kept her awake.  Was started on tizanidine by her dentist for jaw pain which helps with falling asleep.  Elevated LDL: Reports has decreased sodas, limits to one drink per day.  States has not been as active as she should.  Past Medical History:  Diagnosis Date  . Anxiety   . Endometriosis   . Heart murmur    as  a child  . 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 BREAST BIOPSY Left 2016  . BREAST SURGERY     Breast Bx benign  . FOOT SURGERY Left    plantar fasciitis  . NOVASURE ABLATION  10/2004  . PELVIC LAPAROSCOPY  12/2002   endometriosis    Family History  Problem Relation Age of Onset  . Hypertension Father   . Hyperlipidemia Father   . Stroke Father   . Breast cancer Maternal Aunt 67       mother's identical twin  . Cancer Maternal Aunt        breast  . Cancer Maternal Grandfather        lung  . Diabetes Paternal Grandmother   . Breast cancer Maternal Grandmother        dx in her late 77s to early 17s  . Cancer Maternal Grandmother        breast  . Breast cancer Maternal Aunt 49       second cancer at  63  . Cancer Maternal Aunt        breast x 2  . Leukemia Paternal Grandfather   . Cancer Paternal Grandfather        leukemia  . Hypertension Mother   . Thyroid cancer Sister        dx in her early  to mid 38s  . Cancer Sister        thyroid  . Breast cancer Paternal Aunt 86  . Colon cancer Neg Hx   . Esophageal cancer Neg Hx   . Rectal cancer Neg Hx   . Stomach cancer Neg Hx     Social History   Socioeconomic History  . Marital status: Married    Spouse name: Elberta Fortis  . Number of children: 2  . Years of education: Not on file  . Highest education level: Not on file  Occupational History  . Not on file  Tobacco Use  . Smoking status: Never Smoker  . Smokeless tobacco: Never Used  Vaping Use  . Vaping Use: Never used  Substance and Sexual Activity  . Alcohol use: Yes    Alcohol/week: 1.0 standard drink    Types: 1 Glasses of wine per week  . Drug use: No  . Sexual activity: Yes    Partners: Male    Birth control/protection: Surgical    Comment: 1st intercourse 4 yo-5 partners- married- 51 yrs    Other Topics Concern  . Not on file  Social History Narrative  . Not on file   Social Determinants of Health   Financial Resource Strain: Not on file  Food Insecurity: Not on file  Transportation Needs: Not on file  Physical Activity: Not on file  Stress: Not on file  Social Connections: Not on file  Intimate Partner Violence: Not on file    Outpatient Medications Prior to Visit  Medication Sig Dispense Refill  . Cholecalciferol (VITAMIN D) 50 MCG (2000 UT) tablet Take 1 tablet (2,000 Units total) by mouth daily.    Marland Kitchen FLUoxetine (PROZAC) 40 MG capsule Take 1 capsule (40 mg total) by mouth daily. 90 capsule 3  . triamcinolone (KENALOG) 0.025 % ointment Apply 1 application topically at bedtime. 30 g 0  . triamcinolone cream (KENALOG) 0.1 %     . dexamethasone (DECADRON) 0.1 % ophthalmic solution Place 3-4 drops into left ear twice daily. (Patient not taking: Reported on 07/03/2020) 5 mL 0  . fluticasone (FLONASE) 50 MCG/ACT nasal spray Place 1 spray into both nostrils daily. (Patient not taking: Reported on 07/03/2020) 16 g 2  . nystatin (MYCOSTATIN/NYSTOP) powder Apply 1 application topically 2 (two) times daily. (Patient not taking: Reported on 07/03/2020) 15 g 3  . ofloxacin (FLOXIN OTIC) 0.3 % OTIC solution Place 10 drops into the left ear daily x 7 days. (Patient not taking: Reported on 07/03/2020) 5 mL 0  . traZODone (DESYREL) 100 MG tablet Take 0.5-1 tablets (50-100 mg total) by mouth at bedtime as needed for sleep. (Patient not taking: Reported on 07/03/2020) 60 tablet 0   No facility-administered medications prior to visit.    No Known Allergies  ROS Review of Systems Review of Systems:  A fourteen system review of systems was performed and found to be positive as per HPI.  Objective:    Physical Exam General:  Well Developed, well nourished, appropriate for stated age.  Neuro:  Alert and oriented,  extra-ocular muscles intact  HEENT:  Normocephalic, atraumatic,  conjunctiva mildly red L>R, erythema of upper eyelids, neck supple, no adnenopathy Skin: No discrete rash noted, excoriations present on arms and face. Cardiac:  RRR, S1 S2 wnl's Respiratory:  ECTA B/L w/o wheezing, Not using accessory muscles, speaking in full sentences- unlabored. Vascular:  Ext warm, no cyanosis apprec.; cap RF less 2 sec. Psych:  No HI/SI, judgement and insight good, Euthymic  mood. Full Affect.   BP 134/85   Pulse 64   Temp 97.7 F (36.5 C)   Ht 5' (1.524 m)   Wt 233 lb 11.2 oz (106 kg)   LMP 12/25/2015 (Exact Date)   SpO2 98%   BMI 45.64 kg/m  Wt Readings from Last 3 Encounters:  07/03/20 233 lb 11.2 oz (106 kg)  01/10/20 201 lb (91.2 kg)  01/04/20 207 lb 6.4 oz (94.1 kg)     Health Maintenance Due  Topic Date Due  . COVID-19 Vaccine (1) Never done    There are no preventive care reminders to display for this patient.  Lab Results  Component Value Date   TSH 2.690 01/04/2020   Lab Results  Component Value Date   WBC 11.4 (H) 01/04/2020   HGB 14.3 01/04/2020   HCT 43.4 01/04/2020   MCV 88 01/04/2020   PLT 386 01/04/2020   Lab Results  Component Value Date   NA 142 01/04/2020   K 4.2 01/04/2020   CO2 25 01/04/2020   GLUCOSE 83 01/04/2020   BUN 10 01/04/2020   CREATININE 0.77 01/04/2020   BILITOT 0.4 01/04/2020   ALKPHOS 123 (H) 01/04/2020   AST 21 01/04/2020   ALT 18 01/04/2020   PROT 7.3 01/04/2020   ALBUMIN 4.6 01/04/2020   CALCIUM 9.5 01/04/2020   ANIONGAP 9 09/26/2015   Lab Results  Component Value Date   CHOL 239 (H) 01/04/2020   Lab Results  Component Value Date   HDL 63 01/04/2020   Lab Results  Component Value Date   LDLCALC 160 (H) 01/04/2020   Lab Results  Component Value Date   TRIG 93 01/04/2020   Lab Results  Component Value Date   CHOLHDL 3.8 01/04/2020   Lab Results  Component Value Date   HGBA1C 5.5 01/04/2020      Assessment & Plan:   Problem List Items Addressed This Visit      Other    GAD (generalized anxiety disorder) (Chronic)   Hyperlipidemia   Relevant Orders   Antinuclear Antib (ANA)   Rheumatoid Factor   B12 and Folate Panel   Sedimentation rate   C-reactive protein   CBC   Comprehensive metabolic panel   TSH   Lipid panel   Hemoglobin A1c   VITAMIN D 25 Hydroxy (Vit-D Deficiency, Fractures)   Vitamin D insufficiency   Relevant Orders   VITAMIN D 25 Hydroxy (Vit-D Deficiency, Fractures)   Insomnia    Other Visit Diagnoses    Polyarthralgia    -  Primary   Relevant Orders   Antinuclear Antib (ANA)   Rheumatoid Factor   B12 and Folate Panel   Sedimentation rate   C-reactive protein   CBC   Comprehensive metabolic panel   TSH   Lipid panel   Hemoglobin A1c   VITAMIN D 25 Hydroxy (Vit-D Deficiency, Fractures)   Fatigue, unspecified type       Relevant Orders   Antinuclear Antib (ANA)   Rheumatoid Factor   B12 and Folate Panel   Sedimentation rate   C-reactive protein   CBC   Comprehensive metabolic panel   TSH   Lipid panel   Hemoglobin A1c   VITAMIN D 25 Hydroxy (Vit-D Deficiency, Fractures)   Healthcare maintenance       Relevant Orders   Antinuclear Antib (ANA)   Rheumatoid Factor   B12 and Folate Panel   Sedimentation rate   C-reactive protein   CBC   Comprehensive metabolic  panel   TSH   Lipid panel   Hemoglobin A1c   VITAMIN D 25 Hydroxy (Vit-D Deficiency, Fractures)   Pruritus       Relevant Medications   methylPREDNISolone acetate (DEPO-MEDROL) injection 40 mg (Completed)   Blepharitis of upper eyelids of both eyes, unspecified type       Relevant Medications   erythromycin ophthalmic ointment     Hyperlipidemia: -Last lipid panel: total cholesterol 239, triglycerides 93, HDL 63, LDL 160 The 10-year ASCVD risk score Mikey Bussing DC Jr., et al., 2013) is: 1.9% -Discussed with patient to continue reducing simple carbohydrates as well as saturated and trans fats. Increase physical activity.  -Will repeat lipid  panel.  Insomnia: -Recommend to limit caffeine and use non-pharmacologic therapy such as the Calm app. -Patient currently on muscle relaxer for jaw pain, recommend prudent use. -Will continue to monitor.  GAD: -PHQ-9 score of 8, denies SI/HI. -Continue current medication regimen. Discussed with patient if experiences mood changes or depressive symptomology recommend to consider treatment adjustments. Medication managed by OB/GYN.  -Will continue to monitor.   Fatigue, Polyarthralgia: -Will place lab orders to evaluate for potential etiologies such as inflammatory arthropathy, autoimmune condition, nutritional deficiency. -Recommend further evaluation for left knee pain and locking episodes with imaging or referral to orthopedics. Advised patient to let me know if decides to pursue.  Pruritus: -Etiology unclear, no discrete rash noted during exam. -Recommend to avoid scratching. Patient has tried Sarna lotion with minimal improvement so will place order for corticosteroid injection to help improve symptoms. -Will place lab orders to evaluate for possible systemic etiologies.   Blepharitis of upper eyelids of both eyes, unspecified type: -Will start topical antibiotic. If symptoms fail to improve or worsen recommend to follow-up with ophthalmologist.   Meds ordered this encounter  Medications  . methylPREDNISolone acetate (DEPO-MEDROL) injection 40 mg  . erythromycin ophthalmic ointment    Sig: Place 1 application into both eyes at bedtime.    Dispense:  3.5 g    Refill:  1    Follow-up: Return in about 6 months (around 01/03/2021) for CPE .   Note:  This note was prepared with assistance of Dragon voice recognition software. Occasional wrong-word or sound-a-like substitutions may have occurred due to the inherent limitations of voice recognition software.  Lorrene Reid, PA-C

## 2020-07-04 LAB — B12 AND FOLATE PANEL
Folate: >20 ng/mL (ref >3.0)
Vitamin B-12: 922 pg/mL (ref 232–1245)

## 2020-07-04 LAB — CBC
Hematocrit: 41 % (ref 34.0–46.6)
Hemoglobin: 13.6 g/dL (ref 11.1–15.9)
MCH: 29.5 pg (ref 26.6–33.0)
MCHC: 33.2 g/dL (ref 31.5–35.7)
MCV: 89 fL (ref 79–97)
Platelets: 357 10*3/uL (ref 150–450)
RBC: 4.61 x10E6/uL (ref 3.77–5.28)
RDW: 12.7 % (ref 11.7–15.4)
WBC: 11.3 10*3/uL — ABNORMAL HIGH (ref 3.4–10.8)

## 2020-07-04 LAB — ANA: Anti Nuclear Antibody (ANA): NEGATIVE

## 2020-07-04 LAB — HEMOGLOBIN A1C
Est. average glucose Bld gHb Est-mCnc: 105 mg/dL
Hgb A1c MFr Bld: 5.3 % (ref 4.8–5.6)

## 2020-07-04 LAB — VITAMIN D 25 HYDROXY (VIT D DEFICIENCY, FRACTURES): Vit D, 25-Hydroxy: 43.6 ng/mL (ref 30.0–100.0)

## 2020-07-04 LAB — TSH: TSH: 3.68 u[IU]/mL (ref 0.450–4.500)

## 2020-07-04 LAB — C-REACTIVE PROTEIN: CRP: 6 mg/L (ref 0–10)

## 2020-07-04 LAB — RHEUMATOID FACTOR: Rheumatoid fact SerPl-aCnc: <10 IU/mL (ref <14.0)

## 2020-07-08 ENCOUNTER — Telehealth: Payer: Self-pay | Admitting: Physician Assistant

## 2020-07-08 NOTE — Telephone Encounter (Signed)
-----   Message from Lorrene Reid, [place] sent at 07/05/2020  5:35 PM EDT ----- Please contact [facility] and inquire status of pending labs- CMP, Lipid panel, Sed Rate  Thank you, Herb Grays

## 2020-07-08 NOTE — Telephone Encounter (Signed)
These labs were not obtained at the same time the other labs were drawn. AS, CMA

## 2020-07-09 ENCOUNTER — Encounter: Payer: Self-pay | Admitting: Physician Assistant

## 2020-10-31 ENCOUNTER — Encounter: Payer: Self-pay | Admitting: Physician Assistant

## 2020-10-31 ENCOUNTER — Other Ambulatory Visit: Payer: Self-pay

## 2020-10-31 ENCOUNTER — Ambulatory Visit (INDEPENDENT_AMBULATORY_CARE_PROVIDER_SITE_OTHER): Payer: 59 | Admitting: Physician Assistant

## 2020-10-31 VITALS — BP 122/80 | HR 67 | Ht 63.0 in | Wt 208.0 lb

## 2020-10-31 DIAGNOSIS — H539 Unspecified visual disturbance: Secondary | ICD-10-CM | POA: Diagnosis not present

## 2020-10-31 DIAGNOSIS — R519 Headache, unspecified: Secondary | ICD-10-CM | POA: Diagnosis not present

## 2020-10-31 DIAGNOSIS — R6884 Jaw pain: Secondary | ICD-10-CM | POA: Diagnosis not present

## 2020-10-31 MED ORDER — METHYLPREDNISOLONE ACETATE 80 MG/ML IJ SUSP
80.0000 mg | Freq: Once | INTRAMUSCULAR | Status: AC
  Administered 2020-10-31: 80 mg via INTRAMUSCULAR

## 2020-10-31 MED ORDER — PREDNISONE 20 MG PO TABS: ORAL_TABLET | ORAL | 0 refills | Status: DC

## 2020-10-31 NOTE — Progress Notes (Signed)
Acute Office Visit  Subjective:    Patient ID: Angela Ramsey, female    DOB: 1965/12/30, 55 y.o.   MRN: [id number]  Chief Complaint  Patient presents with   Acute Visit    Left Jaw Pain    HPI Patient is in today for c/o jaw pain x several months. Dentist gave her a muscle relaxer back in April for jaw pain. States felt like medication did help but symptoms returned. Tried tylenol and ibuprofen which provided minimal relief. Saw her dentist last week and reports had an x-ray done which was essentially normal, some calcification of TMJ ligament. Reports developing a unilateral headache and feels a pulse at the temple on right side within the past month. Has also noticed intermittent double vision and has seen her ophthalmologist. Denies vision loss, facial numbness or tingling sensation, fever, chest pain, palpitations, dizziness, or trouble with doing ADL's. States does have generalized joint pain.   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   ATM gene called c.[id number]  mutation of unknown significance see genetic counseling note 09/21/2014    Past Surgical History:  Procedure Laterality Date   BREAST BIOPSY Left 2016   BREAST SURGERY     Breast Bx benign   FOOT SURGERY Left    plantar fasciitis   NOVASURE ABLATION  10/2004   PELVIC LAPAROSCOPY  12/2002   endometriosis    Family History  Problem Relation Age of Onset   Hypertension Father    Hyperlipidemia Father    Stroke Father    Breast cancer Maternal Aunt 67       mother's identical twin   Cancer Maternal Aunt        breast   Cancer Maternal Grandfather        lung   Diabetes Paternal Grandmother    Breast cancer Maternal Grandmother        dx in her late 40s to early 41s   Cancer Maternal Grandmother        breast   Breast cancer Maternal Aunt 96       second cancer at  75   Cancer Maternal Aunt         breast x 2   Leukemia Paternal Grandfather    Cancer Paternal Grandfather        leukemia   Hypertension Mother    Thyroid cancer Sister        dx in her early to mid 54s   Cancer Sister        thyroid   Breast cancer Paternal Aunt 86   Colon cancer Neg Hx    Esophageal cancer Neg Hx    Rectal cancer Neg Hx    Stomach cancer Neg Hx     Social History   Socioeconomic History   Marital status: Married    Spouse name: Elberta Fortis   Number of children: 2   Years of education: Not on file   Highest education level: Not on file  Occupational History   Not on file  Tobacco Use   Smoking status: Never   Smokeless tobacco: Never  Vaping Use   Vaping Use: Never used  Substance and Sexual Activity   Alcohol use: Yes    Alcohol/week: 1.0 standard drink    Types: 1 Glasses of wine per week   Drug use: No  Sexual activity: Yes    Partners: Male    Birth control/protection: Surgical    Comment: 1st intercourse 78 yo-5 partners- married- 68 yrs   Other Topics Concern   Not on file  Social History Narrative   Not on file   Social Determinants of Health   Financial Resource Strain: Not on file  Food Insecurity: Not on file  Transportation Needs: Not on file  Physical Activity: Not on file  Stress: Not on file  Social Connections: Not on file  Intimate Partner Violence: Not on file    Outpatient Medications Prior to Visit  Medication Sig Dispense Refill   Cholecalciferol (VITAMIN D) 50 MCG (2000 UT) tablet Take 1 tablet (2,000 Units total) by mouth daily.     FLUoxetine (PROZAC) 40 MG capsule Take 1 capsule (40 mg total) by mouth daily. 90 capsule 3   tiZANidine (ZANAFLEX) 2 MG tablet Take 2 mg by mouth at bedtime.     triamcinolone (KENALOG) 0.025 % ointment Apply 1 application topically at bedtime. 30 g 0   triamcinolone cream (KENALOG) 0.1 %      erythromycin ophthalmic ointment Place 1 application into both eyes at bedtime. 3.5 g 1   No facility-administered medications  prior to visit.    No Known Allergies  Review of Systems Review of Systems:  A fourteen system review of systems was performed and found to be positive as per HPI.   Objective:    Physical Exam General:  Well Developed, well nourished, appropriate for stated age.  Neuro:  Alert and oriented, extra-ocular muscles intact, CN II-XII grossly intact,   HEENT:  Normocephalic, atraumatic, tenderness of right temporal area, fundoscopic exam benign, normal TM's of both ears, neck supple, no adenopathy  Skin:  no gross rash, warm, pink. Cardiac:  RRR, S1 S2 Respiratory:  CTA B/L w/o wheezing, Not using accessory muscles, speaking in full sentences- unlabored. Vascular:  Ext warm, no cyanosis apprec.; cap RF less 2 sec. Psych:  No HI/SI, judgement and insight good, Euthymic mood. Full Affect.  BP 122/80   Pulse 67   Ht _0  (1.6 m)   Wt 208 lb (94.3 kg)   LMP 12/25/2015 (Exact Date)   SpO2 97%   BMI 36.85 kg/m  Wt Readings from Last 3 Encounters:  10/31/20 208 lb (94.3 kg)  07/03/20 233 lb 11.2 oz (106 kg)  01/10/20 201 lb (91.2 kg)    Health Maintenance Due  Topic Date Due   COVID-19 Vaccine (1) Never done   INFLUENZA VACCINE  09/16/2020    There are no preventive care reminders to display for this patient.   Lab Results  Component Value Date   TSH 3.680 07/03/2020   Lab Results  Component Value Date   WBC 11.3 (H) 07/03/2020   HGB 13.6 07/03/2020   HCT 41.0 07/03/2020   MCV 89 07/03/2020   PLT 357 07/03/2020   Lab Results  Component Value Date   NA 142 01/04/2020   K 4.2 01/04/2020   CO2 25 01/04/2020   GLUCOSE 83 01/04/2020   BUN 10 01/04/2020   CREATININE 0.77 01/04/2020   BILITOT 0.4 01/04/2020   ALKPHOS 123 (H) 01/04/2020   AST 21 01/04/2020   ALT 18 01/04/2020   PROT 7.3 01/04/2020   ALBUMIN 4.6 01/04/2020   CALCIUM 9.5 01/04/2020   ANIONGAP 9 09/26/2015   Lab Results  Component Value Date   CHOL 239 (H) 01/04/2020   Lab Results  Component  Value Date  HDL 63 01/04/2020   Lab Results  Component Value Date   LDLCALC 160 (H) 01/04/2020   Lab Results  Component Value Date   TRIG 93 01/04/2020   Lab Results  Component Value Date   CHOLHDL 3.8 01/04/2020   Lab Results  Component Value Date   HGBA1C 5.3 07/03/2020       Assessment & Plan:   Problem List Items Addressed This Visit   None Visit Diagnoses     Jaw pain    -  Primary   Relevant Medications   predniSONE (DELTASONE) 20 MG tablet   methylPREDNISolone acetate (DEPO-MEDROL) injection 80 mg (Completed)   Other Relevant Orders   CBC w/Diff   Comp Met (CMET)   Sedimentation rate   C-reactive protein   ANA w/Reflex if Positive   Ambulatory referral to Rheumatology   Unilateral headache       Relevant Medications   tiZANidine (ZANAFLEX) 2 MG tablet   methylPREDNISolone acetate (DEPO-MEDROL) injection 80 mg (Completed)   Other Relevant Orders   CBC w/Diff   Comp Met (CMET)   Sedimentation rate   C-reactive protein   ANA w/Reflex if Positive   Ambulatory referral to Rheumatology   Vision changes       Relevant Orders   Ambulatory referral to Rheumatology      Jaw pain, unilateral headache, vision changes: -Patient has s/s concerning for temporal arteritis so will start corticosteroid therapy and place referral to rheumatology for further evaluation. CRP was obtained 07/03/2020 which was normal at 6. Will repeat CRP and obtain ESR, ANA, CMP and CBC. Trigeminal neuralgia also on ddx. -Discussed ref flag s/s and patient is aware to seek immediate medical care.  -Will reassess symptoms and medication therapy in 1 week.  Meds ordered this encounter  Medications   predniSONE (DELTASONE) 20 MG tablet    Sig: Take 3 tablets by mouth x 2 days,  2 tablets x 2 days,  1 tablet x 2 days,  0.5 tablet x 2 days    Dispense:  15 tablet    Refill:  0    Order Specific Question:   Supervising Provider    Answer:   Beatrice Lecher D [[id number]]    methylPREDNISolone acetate (DEPO-MEDROL) injection 80 mg     Lorrene Reid, PA-C

## 2020-10-31 NOTE — Patient Instructions (Signed)
Temporal Arteritis °Temporal arteritis is a condition that causes arteries to become inflamed. It usually affects arteries in your head and face, but arteries in any part of the body can become inflamed. The condition is also called giant cell arteritis.  °Temporal arteritis can cause serious problems such as blindness. Early treatment can help prevent these problems. °What are the causes? °The cause of this condition is not known. °What increases the risk? °The following factors may make you more likely to develop this condition: °Being older than 50. °Being a woman. °Being Caucasian. °Being of Danish, Swedish, Finnish, Norwegian, or Icelandic ancestry. °Having a family history of the condition. °Having a certain condition that causes muscle pain and stiffness (polymyalgia rheumatica, PMR). °What are the signs or symptoms? °Some people with temporal arteritis have just one symptom, while others have several symptoms. Most symptoms are related to the head and face. These may include: °Headache. °Hard, swollen, or tender temples. This is common. Your temples are the areas on either side of your forehead. °Pain when combing your hair or when laying your head down. °Pain in the jaw when chewing. °Pain in the throat or tongue. °Problems with your vision, such as sudden loss of vision in one eye, or seeing double. °Other symptoms may include: °Fever. °Tiredness (fatigue). °A dry cough. °Pain in the hips and shoulders. °Pain in the arms during exercise. °Depression. °Weight loss. °How is this diagnosed? °This condition may be diagnosed based on: °Your symptoms. °Your medical history. °A physical exam. °Tests, including: °Blood tests. °A test in which a tissue sample is removed from an artery so it can be examined (biopsy). °Imaging tests, such as an ultrasound or MRI. °How is this treated? °This condition may be treated with: °A type of medicine to reduce inflammation (corticosteroid). °Medicines to weaken your immune  system (immunosuppressants). °Other medicines to treat vision problems. °You will need to see your health care provider while you are being treated. The medicines used to treat this condition can increase your risk of problems such as bone loss and diabetes. During follow-up visits, your health care provider will check for problems by: °Doing blood tests and bone density tests. °Checking your blood pressure and blood sugar. °Follow these instructions at home: °Medicines °Take over-the-counter and prescription medicines only as told by your health care provider. °Take any vitamins or supplements recommended by your health care provider. These may include vitamin D and calcium, which help keep your bones from becoming weak. °Eating and drinking ° °Eat a heart-healthy diet. This may include: °Eating high-fiber foods, such as fresh fruits and vegetables, whole grains, and beans. °Eating heart-healthy fats (omega-3 fats), such as fish, flaxseed, and flaxseed oil. °Limiting foods that are high in saturated fat and cholesterol, such as processed and fried foods, fatty meat, and full-fat dairy. °Limiting how much salt (sodium) you eat. °Include calcium and vitamin D in your diet. Good sources of calcium and vitamin D include: °Low-fat dairy products such as milk, yogurt, and cheese. °Certain fish, such as fresh or canned salmon, tuna, and sardines. °Products that have calcium and vitamin D added to them (fortified products), such as fortified cereals or juice. °General instructions °Exercise. Talk with your health care provider about what exercises are okay for you. Exercises that increase your heart rate (aerobic exercise), such as walking, are often recommended. Aerobic exercise helps control your blood pressure and prevent bone loss. °Stay up to date on all vaccines as directed by your health care provider. °Keep all   follow-up visits as told by your health care provider. This is important. °Contact a health care provider  if: °Your symptoms get worse. °You develop signs of infection, such as fever, swelling, redness, warmth, and tenderness. °Get help right away if: °You lose your vision. °Your pain does not go away, even after you take medicine. °You have chest pain. °You have trouble breathing. °One side of your face or body suddenly becomes weak or numb. °These symptoms may represent a serious problem that is an emergency. Do not wait to see if the symptoms will go away. Get medical help right away. Call your local emergency services (911 in the [place] Do not drive yourself to the hospital. °Summary °Temporal arteritis is a condition that causes arteries to become inflamed. It usually affects arteries in your head and face. °This condition can cause serious problems, such as blindness. Treatment can help prevent these problems. °Symptoms may include hard or tender temples, pain in your jaw when chewing, problems with your vision, or pain in your hips and shoulders. °Take over-the-counter and prescription medicines as told by your health care provider. °This information is not intended to replace advice given to you by your health care provider. Make sure you discuss any questions you have with your health care provider. °Document Revised: 04/16/2020 Document Reviewed: 04/16/2020 °Elsevier Patient Education © 2022 [employer] ° °

## 2020-11-01 LAB — CBC WITH DIFFERENTIAL/PLATELET
Basophils Absolute: 0 10*3/uL (ref 0.0–0.2)
Basos: 0 %
EOS (ABSOLUTE): 0.1 10*3/uL (ref 0.0–0.4)
Eos: 1 %
Hematocrit: 42.3 % (ref 34.0–46.6)
Hemoglobin: 14 g/dL (ref 11.1–15.9)
Immature Grans (Abs): 0.1 10*3/uL (ref 0.0–0.1)
Immature Granulocytes: 1 %
Lymphocytes Absolute: 3.7 10*3/uL — ABNORMAL HIGH (ref 0.7–3.1)
Lymphs: 32 %
MCH: 29.5 pg (ref 26.6–33.0)
MCHC: 33.1 g/dL (ref 31.5–35.7)
MCV: 89 fL (ref 79–97)
Monocytes Absolute: 0.5 10*3/uL (ref 0.1–0.9)
Monocytes: 4 %
Neutrophils Absolute: 7.3 10*3/uL — ABNORMAL HIGH (ref 1.4–7.0)
Neutrophils: 62 %
Platelets: 389 10*3/uL (ref 150–450)
RBC: 4.75 x10E6/uL (ref 3.77–5.28)
RDW: 12.5 % (ref 11.7–15.4)
WBC: 11.8 10*3/uL — ABNORMAL HIGH (ref 3.4–10.8)

## 2020-11-01 LAB — COMPREHENSIVE METABOLIC PANEL
ALT: 22 IU/L (ref 0–32)
AST: 23 IU/L (ref 0–40)
Albumin/Globulin Ratio: 1.5 (ref 1.2–2.2)
Albumin: 4.6 g/dL (ref 3.8–4.9)
Alkaline Phosphatase: 128 IU/L — ABNORMAL HIGH (ref 44–121)
BUN/Creatinine Ratio: 20 (ref 9–23)
BUN: 15 mg/dL (ref 6–24)
Bilirubin Total: 0.4 mg/dL (ref 0.0–1.2)
CO2: 26 mmol/L (ref 20–29)
Calcium: 9.4 mg/dL (ref 8.7–10.2)
Chloride: 97 mmol/L (ref 96–106)
Creatinine, Ser: 0.76 mg/dL (ref 0.57–1.00)
Globulin, Total: 3 g/dL (ref 1.5–4.5)
Glucose: 93 mg/dL (ref 65–99)
Potassium: 3.9 mmol/L (ref 3.5–5.2)
Sodium: 137 mmol/L (ref 134–144)
Total Protein: 7.6 g/dL (ref 6.0–8.5)
eGFR: 94 mL/min/{1.73_m2} (ref >59)

## 2020-11-01 LAB — SEDIMENTATION RATE: Sed Rate: 19 mm/hr (ref 0–40)

## 2020-11-01 LAB — ANA W/REFLEX IF POSITIVE: Anti Nuclear Antibody (ANA): NEGATIVE

## 2020-11-01 LAB — C-REACTIVE PROTEIN: CRP: 8 mg/L (ref 0–10)

## 2020-11-07 ENCOUNTER — Encounter: Payer: Self-pay | Admitting: Physician Assistant

## 2020-11-07 ENCOUNTER — Other Ambulatory Visit: Payer: Self-pay

## 2020-11-07 ENCOUNTER — Ambulatory Visit (INDEPENDENT_AMBULATORY_CARE_PROVIDER_SITE_OTHER): Payer: 59 | Admitting: Physician Assistant

## 2020-11-07 VITALS — BP 129/80 | HR 68 | Temp 98.0°F | Ht 60.0 in | Wt 207.5 lb

## 2020-11-07 DIAGNOSIS — R519 Headache, unspecified: Secondary | ICD-10-CM

## 2020-11-07 DIAGNOSIS — D72829 Elevated white blood cell count, unspecified: Secondary | ICD-10-CM | POA: Diagnosis not present

## 2020-11-07 DIAGNOSIS — R6884 Jaw pain: Secondary | ICD-10-CM | POA: Diagnosis not present

## 2020-11-07 NOTE — Progress Notes (Signed)
Established Patient Office Visit  Subjective:  Patient ID: Angela Ramsey, female    DOB: 12/03/1965  Age: 55 y.o. MRN: [id number]  CC:  Chief Complaint  Patient presents with   Follow-up    Jaw Pain    HPI [facility] presents for follow up. States the last 3 days has not had a headache or jaw pain. Reports vision has been fine. Has not experienced double vision episodes. States does have tenderness of right temple but has not felt pulsating sensation as before. Will complete steroid taper tomorrow.   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   ATM gene called c.7187C>G  mutation of unknown significance see genetic counseling note 09/21/2014    Past Surgical History:  Procedure Laterality Date   BREAST BIOPSY Left 2016   BREAST SURGERY     Breast Bx benign   FOOT SURGERY Left    plantar fasciitis   NOVASURE ABLATION  10/2004   PELVIC LAPAROSCOPY  12/2002   endometriosis    Family History  Problem Relation Age of Onset   Hypertension Father    Hyperlipidemia Father    Stroke Father    Breast cancer Maternal Aunt 67       mother's identical twin   Cancer Maternal Aunt        breast   Cancer Maternal Grandfather        lung   Diabetes Paternal Grandmother    Breast cancer Maternal Grandmother        dx in her late 36s to early 33s   Cancer Maternal Grandmother        breast   Breast cancer Maternal Aunt 10       second cancer at  56   Cancer Maternal Aunt        breast x 2   Leukemia Paternal Grandfather    Cancer Paternal Grandfather        leukemia   Hypertension Mother    Thyroid cancer Sister        dx in her early to mid 38s   Cancer Sister        thyroid   Breast cancer Paternal Aunt 86   Colon cancer Neg Hx    Esophageal cancer Neg Hx    Rectal cancer Neg Hx    Stomach cancer Neg Hx     Social History   Socioeconomic History    Marital status: Married    Spouse name: Elberta Fortis   Number of children: 2   Years of education: Not on file   Highest education level: Not on file  Occupational History   Not on file  Tobacco Use   Smoking status: Never   Smokeless tobacco: Never  Vaping Use   Vaping Use: Never used  Substance and Sexual Activity   Alcohol use: Yes    Alcohol/week: 1.0 standard drink    Types: 1 Glasses of wine per week   Drug use: No   Sexual activity: Yes    Partners: Male    Birth control/protection: Surgical    Comment: 1st intercourse 30 yo-5 partners- married- 50 yrs   Other Topics Concern   Not on file  Social History Narrative   Not on file   Social Determinants of Health   Financial Resource Strain: Not on file  Food Insecurity: Not  on file  Transportation Needs: Not on file  Physical Activity: Not on file  Stress: Not on file  Social Connections: Not on file  Intimate Partner Violence: Not on file    Outpatient Medications Prior to Visit  Medication Sig Dispense Refill   Cholecalciferol (VITAMIN D) 50 MCG (2000 UT) tablet Take 1 tablet (2,000 Units total) by mouth daily.     FLUoxetine (PROZAC) 40 MG capsule Take 1 capsule (40 mg total) by mouth daily. 90 capsule 3   predniSONE (DELTASONE) 20 MG tablet Take 3 tablets by mouth x 2 days,  2 tablets x 2 days,  1 tablet x 2 days,  0.5 tablet x 2 days 15 tablet 0   triamcinolone (KENALOG) 0.025 % ointment Apply 1 application topically at bedtime. 30 g 0   triamcinolone cream (KENALOG) 0.1 %      tiZANidine (ZANAFLEX) 2 MG tablet Take 2 mg by mouth at bedtime.     No facility-administered medications prior to visit.    No Known Allergies  ROS Review of Systems A fourteen system review of systems was performed and found to be positive as per HPI.   Objective:    Physical Exam General:  Well Developed, well nourished, appropriate for stated age.  Neuro:  Alert and oriented,  extra-ocular muscles intact  HEENT:   Normocephalic, atraumatic, tenderness of right temple, no mandibular tenderness, no adenopathy, neck supple Skin:  no gross rash, warm, pink. Cardiac:  RRR, S1 S2 Respiratory:  CTA B/L, Not using accessory muscles, speaking in full sentences- unlabored. Vascular:  Ext warm, no cyanosis apprec.; cap RF less 2 sec. Psych:  No HI/SI, judgement and insight good, Euthymic mood. Full Affect.  LMP 12/25/2015 (Exact Date)  Wt Readings from Last 3 Encounters:  10/31/20 208 lb (94.3 kg)  07/03/20 233 lb 11.2 oz (106 kg)  01/10/20 201 lb (91.2 kg)     Health Maintenance Due  Topic Date Due   COVID-19 Vaccine (1) Never done   INFLUENZA VACCINE  09/16/2020    There are no preventive care reminders to display for this patient.  Lab Results  Component Value Date   TSH 3.680 07/03/2020   Lab Results  Component Value Date   WBC 11.8 (H) 10/31/2020   HGB 14.0 10/31/2020   HCT 42.3 10/31/2020   MCV 89 10/31/2020   PLT 389 10/31/2020   Lab Results  Component Value Date   NA 137 10/31/2020   K 3.9 10/31/2020   CO2 26 10/31/2020   GLUCOSE 93 10/31/2020   BUN 15 10/31/2020   CREATININE 0.76 10/31/2020   BILITOT 0.4 10/31/2020   ALKPHOS 128 (H) 10/31/2020   AST 23 10/31/2020   ALT 22 10/31/2020   PROT 7.6 10/31/2020   ALBUMIN 4.6 10/31/2020   CALCIUM 9.4 10/31/2020   ANIONGAP 9 09/26/2015   EGFR 94 10/31/2020   Lab Results  Component Value Date   CHOL 239 (H) 01/04/2020   Lab Results  Component Value Date   HDL 63 01/04/2020   Lab Results  Component Value Date   LDLCALC 160 (H) 01/04/2020   Lab Results  Component Value Date   TRIG 93 01/04/2020   Lab Results  Component Value Date   CHOLHDL 3.8 01/04/2020   Lab Results  Component Value Date   HGBA1C 5.3 07/03/2020      Assessment & Plan:   Problem List Items Addressed This Visit   None Visit Diagnoses     Jaw pain    -  Primary   Unilateral headache          Jaw pain, unilateral headache: -Symptoms  have improved with corticosteroid therapy. Recommend to complete taper as directed. Advised to monitor symptoms and to let me know if symptoms reoccur. Patient is scheduled with rheumatology 12/02/2020.  Leukocytosis: -Patient's WBC has been chronically elevated, most recently at 11.8. Patient denies prior hematology evaluation. Will continue to monitor CBC and if WBC remains elevated when repeated with CPE then recommend referral to hematology. Patient verbalized understanding.   No orders of the defined types were placed in this encounter.   Follow-up: No follow-ups on file.    Lorrene Reid, PA-C

## 2020-11-12 ENCOUNTER — Other Ambulatory Visit: Payer: Self-pay | Admitting: Physician Assistant

## 2020-11-12 ENCOUNTER — Encounter: Payer: Self-pay | Admitting: Physician Assistant

## 2020-11-12 DIAGNOSIS — R6884 Jaw pain: Secondary | ICD-10-CM

## 2020-11-12 MED ORDER — PREDNISONE 20 MG PO TABS: 40.0000 mg | ORAL_TABLET | Freq: Every day | ORAL | 0 refills | Status: DC

## 2020-12-01 NOTE — Progress Notes (Signed)
Office Visit Note  Patient: Angela Ramsey             Date of Birth: May 14, 1965           MRN: [id number]             PCP: Lorrene Reid, PA-C Referring: Lorrene Reid, PA-C Visit Date: 12/02/2020  Subjective:  New Patient (Initial Visit) (Temporal iritis? Abnormal labs, taken last dose of prednisone today, patient has not had COVID vaccines/)   History of Present Illness: Angela Ramsey is a 55 y.o. female here for evaluation of possible temporal arteritis with jaw pain, unilateral headache, and vision change seen at PCP clinic visit 9/15. She was started on high dose prednisone taper finishing the last dose today. Inflammatory serology was unremarkable at initial workup.  Currently she is feeling pretty well since starting the steroids.  She describes before this the left-sided headache was focal mostly over the temporal area persistent constantly without significant radiation or associated symptoms.  She had ophthalmology exam that did not identify any inflammation at that time.  Her jaw pain was also concerning for possible TMJ dysfunction but this was thought to not be the case by her dentist.  She does not have a chronic history of TMJ dysfunction or bruxism.  Labs reviewed 10/2020 ANA neg ESR 19 CRP 8 CBC WBC 11.8 CMP unremarkable  Activities of Daily Living:  Patient reports morning stiffness for 24 hours.   Patient Reports nocturnal pain.  Difficulty dressing/grooming: Denies Difficulty climbing stairs: Reports Difficulty getting out of chair: Denies Difficulty using hands for taps, buttons, cutlery, and/or writing: Denies  Review of Systems  Constitutional:  Positive for fatigue.  HENT:  Negative for mouth dryness.   Eyes:  Positive for dryness.  Respiratory:  Negative for shortness of breath.   Cardiovascular:  Positive for swelling in legs/feet.  Gastrointestinal:  Positive for constipation and diarrhea.  Endocrine: Positive for heat intolerance,  excessive hunger and increased urination.  Genitourinary:  Negative for difficulty urinating.  Musculoskeletal:  Positive for joint pain, joint pain, joint swelling, muscle weakness, morning stiffness and muscle tenderness.  Skin:  Negative for rash.  Allergic/Immunologic: Negative for susceptible to infections.  Neurological:  Positive for numbness and weakness.  Hematological:  Positive for bruising/bleeding tendency.  Psychiatric/Behavioral:  Positive for sleep disturbance.    PMFS History:  Patient Active Problem List   Diagnosis Date Noted   Temporal headache 12/02/2020   Jaw pain 12/02/2020   Vitamin D insufficiency 05/02/2019   Insomnia 05/02/2019   obesity ([place]) 05/02/2019   Eczema 05/02/2019   Unspecified menopausal and perimenopausal disorder 05/18/2018   Hyperlipidemia 05/18/2018   mutation of ATM gene 04/26/2018   Strong Family history of breast cancer in females 04/26/2018   Genital herpes 02/24/2016   Hemorrhoids- small external nonthrombosed hemorrhoid perirectal area at 10 PM 02/24/2016   External hemorrhoids without complication 32/99/2426   Screening for multiple conditions 12/31/2015   Leukocytosis 12/31/2015   Environmental and seasonal allergies 12/01/2015   BMI 37.0-37.9, adult 10/16/2015   GAD (generalized anxiety disorder) 10/16/2015   Family history of early CAD 10/16/2015   Nonspecific chest pain 10/16/2015   Genetic testing 09/21/2014   Family history of breast cancer    Family history of thyroid cancer    h/o ovarian Endometriosis     Past Medical History:  Diagnosis Date   Anxiety    Endometriosis    Heart murmur    as a child  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   ATM gene called c.[id number]  mutation of unknown significance see genetic counseling note 09/21/2014    Family History  Problem Relation Age of Onset   Hypertension Mother    Hypertension Father    Hyperlipidemia Father    Stroke Father    Thyroid cancer Sister        dx  in her early to mid 24s   Cancer Sister        thyroid   Breast cancer Maternal Aunt 104       mother's identical twin   Cancer Maternal Aunt        breast   Breast cancer Maternal Aunt 49       second cancer at  69   Cancer Maternal Aunt        breast x 2   Breast cancer Paternal Aunt 86   Breast cancer Maternal Grandmother        dx in her late 30s to early 44s   Cancer Maternal Grandmother        breast   Cancer Maternal Grandfather        lung   Diabetes Paternal Grandmother    Leukemia Paternal Grandfather    Cancer Paternal Grandfather        leukemia   Colon cancer Neg Hx    Esophageal cancer Neg Hx    Rectal cancer Neg Hx    Stomach cancer Neg Hx    Past Surgical History:  Procedure Laterality Date   BREAST BIOPSY Left 2016   BREAST SURGERY     Breast Bx benign   FOOT SURGERY Left    plantar fasciitis   NOVASURE ABLATION  10/2004   PELVIC LAPAROSCOPY  12/2002   endometriosis   Social History   Social History Narrative   Not on file   Immunization History  Administered Date(s) Administered   Influenza,inj,Quad PF,6+ Mos 12/30/2015, 12/05/2018   Tdap 12/30/2015   Zoster Recombinat (Shingrix) 12/05/2018, 03/03/2019     Objective: Vital Signs: BP 130/84 (BP Location: Right Arm, Patient Position: Sitting, Cuff Size: Normal)   Pulse 69   Resp 16   Ht 5' (1.524 m)   Wt 206 lb (93.4 kg)   LMP 12/25/2015 (Exact Date)   BMI 40.23 kg/m    Physical Exam Constitutional:      Appearance: She is obese.  HENT:     Right Ear: External ear normal.     Left Ear: External ear normal.     Mouth/Throat:     Mouth: Mucous membranes are moist.     Pharynx: Oropharynx is clear.  Eyes:     Conjunctiva/sclera: Conjunctivae normal.  Cardiovascular:     Rate and Rhythm: Normal rate and regular rhythm.  Pulmonary:     Effort: Pulmonary effort is normal.     Breath sounds: Normal breath sounds.  Musculoskeletal:     Right lower leg: No edema.     Left lower leg:  No edema.  Skin:    General: Skin is warm and dry.     Findings: No rash.  Neurological:     General: No focal deficit present.     Mental Status: She is alert.  Psychiatric:        Mood and Affect: Mood normal.  Limited ultrasound inspection of the left superficial branch temporal artery did not demonstrate any halo sign or obvious wall thickening or flow disruptions  Musculoskeletal Exam:  Elbows full ROM  no tenderness or swelling Wrists full ROM no tenderness or swelling Fingers full ROM no tenderness or swelling Knees full ROM no tenderness or swelling   Investigation: No additional findings.  Imaging: No results found.  Recent Labs: Lab Results  Component Value Date   WBC 11.8 (H) 10/31/2020   HGB 14.0 10/31/2020   PLT 389 10/31/2020   NA 137 10/31/2020   K 3.9 10/31/2020   CL 97 10/31/2020   CO2 26 10/31/2020   GLUCOSE 93 10/31/2020   BUN 15 10/31/2020   CREATININE 0.76 10/31/2020   BILITOT 0.4 10/31/2020   ALKPHOS 128 (H) 10/31/2020   AST 23 10/31/2020   ALT 22 10/31/2020   PROT 7.6 10/31/2020   ALBUMIN 4.6 10/31/2020   CALCIUM 9.4 10/31/2020   GFRAA 103 01/04/2020    Speciality Comments: No specialty comments available.  Procedures:  No procedures performed Allergies: Patient has no known allergies.   Assessment / Plan:     Visit Diagnoses: Temporal headache Jaw pain  Symptoms are mildly suspicious but not highly specific and currently hard to say due to being greatly improved on the oral prednisone.  I recommend a quick taper down of the steroid medication by 10 mg increments over the next 2 weeks we will need to follow-up again off the prednisone to recheck for symptoms can also recheck inflammatory markers at that time.  Her age is quite young for giant cell arteritis although not impossible.  I would also maintain a suspicion for alternate headache causes.  Orders: No orders of the defined types were placed in this encounter.  Meds ordered  this encounter  Medications   predniSONE (DELTASONE) 10 MG tablet    Sig: Take 3 tablets (30 mg total) by mouth daily with breakfast for 2 days, THEN 2 tablets (20 mg total) daily with breakfast for 2 days, THEN 1 tablet (10 mg total) daily with breakfast for 2 days.    Dispense:  12 tablet    Refill:  0      Follow-Up Instructions: Return in 3 weeks (on 12/23/2020) for New pt ?TA f/u 3wks.   Collier Salina, MD  Note - This record has been created using [employer].  Chart creation errors have been sought, but may not always  have been located. Such creation errors do not reflect on  the standard of medical care.

## 2020-12-02 ENCOUNTER — Other Ambulatory Visit: Payer: Self-pay

## 2020-12-02 ENCOUNTER — Ambulatory Visit (INDEPENDENT_AMBULATORY_CARE_PROVIDER_SITE_OTHER): Payer: 59 | Admitting: Internal Medicine

## 2020-12-02 ENCOUNTER — Encounter: Payer: Self-pay | Admitting: Internal Medicine

## 2020-12-02 DIAGNOSIS — R519 Headache, unspecified: Secondary | ICD-10-CM | POA: Diagnosis not present

## 2020-12-02 DIAGNOSIS — R6884 Jaw pain: Secondary | ICD-10-CM | POA: Diagnosis not present

## 2020-12-02 MED ORDER — PREDNISONE 10 MG PO TABS: ORAL_TABLET | ORAL | 0 refills | Status: AC

## 2020-12-23 ENCOUNTER — Ambulatory Visit (INDEPENDENT_AMBULATORY_CARE_PROVIDER_SITE_OTHER): Payer: 59 | Admitting: Internal Medicine

## 2020-12-23 ENCOUNTER — Other Ambulatory Visit: Payer: Self-pay

## 2020-12-23 ENCOUNTER — Encounter: Payer: Self-pay | Admitting: Internal Medicine

## 2020-12-23 VITALS — BP 138/94 | HR 75 | Ht 60.0 in | Wt 206.0 lb

## 2020-12-23 DIAGNOSIS — R6884 Jaw pain: Secondary | ICD-10-CM

## 2020-12-23 DIAGNOSIS — R519 Headache, unspecified: Secondary | ICD-10-CM | POA: Diagnosis not present

## 2020-12-23 NOTE — Progress Notes (Signed)
Office Visit Note  Patient: Angela Ramsey             Date of Birth: 05-20-1965           MRN: [id number]             PCP: Lorrene Reid, PA-C Referring: Lorrene Reid, PA-C Visit Date: 12/23/2020   Subjective:   History of Present Illness: Angela Ramsey is a 55 y.o. female here for follow up with new jaw pain and temporal headache concerning for possible temporal arteritis. At last visit she was just finishing prednisone taper so planned to follow up progress off treatment.  Since finishing the prednisone her headache has returned with a vengeance she feels symptoms are may be worse than even before starting treatment.  Also symptoms now involve predominantly the right side compared to being unilaterally just on the left.  She has a constant pain around the temporal area that is present throughout every day.  Also has jaw pain worse on the right side provoked by chewing and moving but worse with just opening her mouth very widely.  She experiences a numb sensation going up towards the scalp intermittently on the right side.  She had a few episodes of double vision but outside of those short episodes no change in visual acuity but she is worried about driving with the intermittent episodes.   Previous HPI 12/02/20 Angela Ramsey is a 55 y.o. female here for evaluation of possible temporal arteritis with jaw pain, unilateral headache, and vision change seen at PCP clinic visit 9/15. She was started on high dose prednisone taper finishing the last dose today. Inflammatory serology was unremarkable at initial workup.  Currently she is feeling pretty well since starting the steroids.  She describes before this the left-sided headache was focal mostly over the temporal area persistent constantly without significant radiation or associated symptoms.  She had ophthalmology exam that did not identify any inflammation at that time.  Her jaw pain was also concerning for possible TMJ  dysfunction but this was thought to not be the case by her dentist.  She does not have a chronic history of TMJ dysfunction or bruxism.   Labs reviewed 10/2020 ANA neg ESR 19 CRP 8 CBC WBC 11.8 CMP unremarkable  Review of Systems  Constitutional:  Positive for fatigue.  HENT:  Negative for mouth sores, mouth dryness and nose dryness.   Eyes:  Positive for visual disturbance. Negative for itching and dryness.  Respiratory:  Negative for shortness of breath and difficulty breathing.   Cardiovascular:  Negative for chest pain and palpitations.  Gastrointestinal:  Negative for blood in stool, constipation and diarrhea.  Endocrine: Negative for increased urination.  Genitourinary:  Negative for difficulty urinating.  Musculoskeletal:  Positive for joint pain, joint pain, myalgias, morning stiffness, muscle tenderness and myalgias. Negative for joint swelling.  Skin:  Negative for color change, rash and redness.  Allergic/Immunologic: Negative for susceptible to infections.  Neurological:  Positive for headaches and parasthesias. Negative for dizziness, numbness and memory loss.  Hematological:  Positive for bruising/bleeding tendency.  Psychiatric/Behavioral:  Negative for confusion.    PMFS History:  Patient Active Problem List   Diagnosis Date Noted   Temporal headache 12/02/2020   Jaw pain 12/02/2020   Vitamin D insufficiency 05/02/2019   Insomnia 05/02/2019   obesity ([place]) 05/02/2019   Eczema 05/02/2019   Unspecified menopausal and perimenopausal disorder 05/18/2018   Hyperlipidemia 05/18/2018   mutation of ATM gene 04/26/2018  Strong Family history of breast cancer in females 04/26/2018   Genital herpes 02/24/2016   Hemorrhoids- small external nonthrombosed hemorrhoid perirectal area at 10 PM 02/24/2016   External hemorrhoids without complication 45/36/4680   Screening for multiple conditions 12/31/2015   Leukocytosis 12/31/2015   Environmental and seasonal allergies  12/01/2015   BMI 37.0-37.9, adult 10/16/2015   GAD (generalized anxiety disorder) 10/16/2015   Family history of early CAD 10/16/2015   Nonspecific chest pain 10/16/2015   Genetic testing 09/21/2014   Family history of breast cancer    Family history of thyroid cancer    h/o ovarian Endometriosis     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   ATM gene called c.7187C>G  mutation of unknown significance see genetic counseling note 09/21/2014    Family History  Problem Relation Age of Onset   Hypertension Mother    Hypertension Father    Hyperlipidemia Father    Stroke Father    Thyroid cancer Sister        dx in her early to mid 39s   Cancer Sister        thyroid   Breast cancer Maternal Aunt 67       mother's identical twin   Cancer Maternal Aunt        breast   Breast cancer Maternal Aunt 49       second cancer at  75   Cancer Maternal Aunt        breast x 2   Breast cancer Paternal Aunt 86   Breast cancer Maternal Grandmother        dx in her late 72s to early 41s   Cancer Maternal Grandmother        breast   Cancer Maternal Grandfather        lung   Diabetes Paternal Grandmother    Leukemia Paternal Grandfather    Cancer Paternal Grandfather        leukemia   Colon cancer Neg Hx    Esophageal cancer Neg Hx    Rectal cancer Neg Hx    Stomach cancer Neg Hx    Past Surgical History:  Procedure Laterality Date   BREAST BIOPSY Left 2016   BREAST SURGERY     Breast Bx benign   FOOT SURGERY Left    plantar fasciitis   NOVASURE ABLATION  10/2004   PELVIC LAPAROSCOPY  12/2002   endometriosis   Social History   Social History Narrative   Not on file   Immunization History  Administered Date(s) Administered   Influenza,inj,Quad PF,6+ Mos 12/30/2015, 12/05/2018   Tdap 12/30/2015   Zoster Recombinat (Shingrix) 12/05/2018, 03/03/2019     Objective: Vital  Signs: BP (!) 138/94 (BP Location: Left Arm, Patient Position: Sitting, Cuff Size: Large)   Pulse 75   Ht 5' (1.524 m)   Wt 206 lb (93.4 kg)   LMP 12/25/2015 (Exact Date)   BMI 40.23 kg/m    Physical Exam Constitutional:      Appearance: She is obese.  HENT:     Right Ear: External ear normal.     Left Ear: External ear normal.     Mouth/Throat:     Mouth: Mucous membranes are moist.     Pharynx: Oropharynx is clear.  Eyes:     Conjunctiva/sclera: Conjunctivae normal.     Pupils:  Pupils are equal, round, and reactive to light.  Cardiovascular:     Rate and Rhythm: Normal rate and regular rhythm.  Pulmonary:     Effort: Pulmonary effort is normal.     Breath sounds: Normal breath sounds.  Skin:    General: Skin is warm and dry.  Neurological:     General: No focal deficit present.     Mental Status: She is alert.  Psychiatric:        Mood and Affect: Mood normal.  Limited ultrasound inspection of bilateral temporal artery superficial branches negative for halo sign no obvious wall thickening or flow disruptions  Musculoskeletal Exam:  Shoulders full ROM no tenderness or swelling Elbows full ROM no tenderness or swelling Wrists full ROM no tenderness or swelling Fingers full ROM no tenderness or swelling Knees full ROM no tenderness or swelling     Investigation: No additional findings.  Imaging: No results found.  Recent Labs: Lab Results  Component Value Date   WBC 11.8 (H) 10/31/2020   HGB 14.0 10/31/2020   PLT 389 10/31/2020   NA 137 10/31/2020   K 3.9 10/31/2020   CL 97 10/31/2020   CO2 26 10/31/2020   GLUCOSE 93 10/31/2020   BUN 15 10/31/2020   CREATININE 0.76 10/31/2020   BILITOT 0.4 10/31/2020   ALKPHOS 128 (H) 10/31/2020   AST 23 10/31/2020   ALT 22 10/31/2020   PROT 7.6 10/31/2020   ALBUMIN 4.6 10/31/2020   CALCIUM 9.4 10/31/2020   GFRAA 103 01/04/2020    Speciality Comments: No specialty comments available.  Procedures:  No  procedures performed Allergies: Patient has no known allergies.   Assessment / Plan:     Visit Diagnoses: Temporal headache - Plan: Sedimentation rate, C-reactive protein Jaw pain - Plan: Sedimentation rate, C-reactive protein  Symptoms remain slightly atypical for temporal arteritis will recheck for sedimentation rate and CRP today.  If these are not significantly elevated I believe next best step would be neurology evaluation for new severe headache with associated symptoms.  If inflammatory markers are very high would need to consider temporal artery biopsy but I have a lower pretest suspicion for this problem.  Orders: Orders Placed This Encounter  Procedures   Sedimentation rate   C-reactive protein    No orders of the defined types were placed in this encounter.    Follow-Up Instructions: No follow-ups on file.   Collier Salina, MD  Note - This record has been created using [employer].  Chart creation errors have been sought, but may not always  have been located. Such creation errors do not reflect on  the standard of medical care.

## 2020-12-24 ENCOUNTER — Telehealth: Payer: Self-pay | Admitting: *Deleted

## 2020-12-24 DIAGNOSIS — R519 Headache, unspecified: Secondary | ICD-10-CM

## 2020-12-24 LAB — SEDIMENTATION RATE: Sed Rate: 28 mm/h (ref 0–30)

## 2020-12-24 LAB — C-REACTIVE PROTEIN: CRP: 8.4 mg/L — ABNORMAL HIGH (ref <8.0)

## 2020-12-24 NOTE — Telephone Encounter (Signed)
-----   Message from Collier Salina, MD sent at 12/24/2020  7:43 AM EST ----- Lab results look good, the CRP of 8.4 is only very slightly outside of normal I do not suspect temporal arteritis based on this. If she is agreeable we can refer to neurology for new persistent headache.

## 2020-12-24 NOTE — Progress Notes (Signed)
Lab results look good, the CRP of 8.4 is only very slightly outside of normal I do not suspect temporal arteritis based on this. If she is agreeable we can refer to neurology for new persistent headache.

## 2021-01-01 ENCOUNTER — Encounter: Payer: Self-pay | Admitting: Psychiatry

## 2021-01-01 ENCOUNTER — Ambulatory Visit (INDEPENDENT_AMBULATORY_CARE_PROVIDER_SITE_OTHER): Payer: 59 | Admitting: Psychiatry

## 2021-01-01 VITALS — BP 122/78 | HR 68 | Ht 60.0 in | Wt 205.8 lb

## 2021-01-01 DIAGNOSIS — H532 Diplopia: Secondary | ICD-10-CM

## 2021-01-01 DIAGNOSIS — G43009 Migraine without aura, not intractable, without status migrainosus: Secondary | ICD-10-CM | POA: Diagnosis not present

## 2021-01-01 MED ORDER — TOPIRAMATE 25 MG PO TABS: ORAL_TABLET | ORAL | 3 refills | Status: DC

## 2021-01-01 NOTE — Progress Notes (Signed)
Referring:  Collier Salina, MD [address] [place],  [place] [address]  PCP: Lorrene Reid, PA-C  Neurology was asked to evaluate a Angela Ramsey, 55 year old female for a chief complaint of headaches.  Our recommendations of care will be communicated by shared medical record.    CC:  headaches  HPI:  Medical co-morbidities: depression, anxiety  The patient presents for evaluation of headaches, double vision, and jaw pain which began in April 2022. Saw her eye doctor who noted a normal eye exam.  Saw her dentist in April who did an X-ray which showed some calcification of her TMJ ligament. Dentist prescribed a muscle relaxer and ibuprofen which helped reduce the pain temporarily but pain returned. Saw PCP in September who started a steroid taper and ordered ESR/CRP which were within normal limits.  Was referred to Rheumatology and Ophthalmology for further evaluation. Eye exam did not show signs of inflammation. Headaches did improve with steroids , but returned after she completed the taper. ESR/CRP were repeated with only very mildly elevated CRP (8.4) and normal ESR.  Headache and jaw pain are described as constant 2/10 R>L throbbing. Has 10/10 pain flares which can last several hours at a time. Headaches are associated with photophobia, has not noticed phonophobia or nausea. Occasionally has a crawling sensation on the right side of her scalp. She continues to have intermittent double vision lasting for a few seconds at a time. This occurs about twice per week and happens at any time of day.   Headache History: Onset: April 2022 Triggers: eating, opening jaw Aura: no Location: bitemporal R>L, right jaw Quality/Description: throbbing Severity: 2/10 daily, can get up to 10/10 Associated Symptoms:  Photophobia: yes  Phonophobia: no  Nausea: no Worse with activity?: yes Duration of headaches: constant Red flags:   New onset age>50  Headache days per  month: 30 Headache free days per month: 0  Current Treatment: Abortive none  Preventative none  Prior Therapies                                 Gabapentin - constipation   Headache Risk Factors: Headache risk factors and/or co-morbidities (+) Neck Pain (+) Back Pain (+) Obesity  Body mass index is 40.19 kg/m.  LABS: ESR 28, CRP 8.4  IMAGING:  CTH 2017 unremarkable (Reviewed personally on 01/01/21)  MRI brain 2013 unremarkable   Current Outpatient Medications on File Prior to Visit  Medication Sig Dispense Refill   B Complex Vitamins (VITAMIN B COMPLEX PO) Take by mouth.     Cholecalciferol (VITAMIN D) 50 MCG (2000 UT) tablet Take 1 tablet (2,000 Units total) by mouth daily.     FLUoxetine (PROZAC) 40 MG capsule Take 1 capsule (40 mg total) by mouth daily. 90 capsule 3   Multiple Vitamins-Minerals (ALIVE MULTI-VITAMIN) CHEW Chew by mouth.     triamcinolone (KENALOG) 0.025 % ointment Apply 1 application topically at bedtime. (Patient taking differently: Apply 1 application topically as needed.) 30 g 0   No current facility-administered medications on file prior to visit.     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   ATM gene called [id number]  mutation of unknown significance see genetic counseling note 09/21/2014    Past Surgical History Past Surgical History:  Procedure Laterality Date   BREAST BIOPSY Left 2016   BREAST SURGERY     Breast Bx benign   FOOT SURGERY Left    plantar fasciitis   NOVASURE ABLATION  10/2004   PELVIC LAPAROSCOPY  12/2002   endometriosis     Social History: Social History   Tobacco Use   Smoking status: Never   Smokeless tobacco: Never  Vaping Use   Vaping Use: Never used  Substance Use Topics   Alcohol use: Not Currently    Comment: 1 every other week or less   Drug use: No    ROS: Negative for fevers, chills. Positive for headaches, double vision, jaw pain. All other systems reviewed and negative unless stated otherwise in HPI.   Physical Exam:   Vital Signs: BP 122/78   Pulse 68   Ht 5' (1.524 m)   Wt 205 lb 12.8 oz (93.4 kg)   LMP 12/25/2015 (Exact Date)   BMI 40.19 kg/m  GENERAL: well appearing,in no acute distress,alert SKIN:  Color, texture, turgor normal. No rashes or lesions HEAD:  Normocephalic/atraumatic. CV:  RRR RESP: Normal respiratory effort MSK: +tenderness to palpation over right temple and jaw  NEUROLOGICAL: Mental Status: Alert, oriented to person, place and time,Follows commands Cranial Nerves: PERRL, optic discs sharp OU, visual fields intact to confrontation,extraocular movements intact, no nystagmus, facial sensation intact,no facial droop or ptosis,hearing intact to finger rub bilaterally,no dysarthria,palate elevate symmetrically,tongue protrudes midline,shoulder shrug intact and symmetric Motor: muscle strength 5/5 both upper and lower extremities,no drift, normal tone Reflexes: 2+ throughout Sensation: intact to light touch all 4 extremities Coordination: Finger-to- nose-finger intact bilaterally, Heel-to-shin intact bilaterally Gait: normal-based   IMPRESSION: 54 year old female with a history of depression and anxiety who presents for evaluation of right sided headaches, jaw pain, and double vision since April 2022. Inflammatory markers and eye exam have been normal. Will order MRI brain to assess for underlying structural causes of unilateral headaches and double vision. Headaches have some migrainous features including throbbing pain and photophobia. She may also have a  component of right occipital neuralgia as she describes a crawling sensation on her scalp. Will start Topamax for headache prevention. Discussed alternate treatment options including neck PT and occipital nerve block, and she would prefer to try oral medication first.  PLAN: -MRI brain -Start Topamax. Take 25 mg once daily; increase dose by 25 mg each week as tolerated -- up to 100 mg/day - consider neck PT, occipital nerve block, gabapentin or lyrica  I spent a total of 50 minutes chart reviewing and counseling the patient. Headache education was done. Discussed treatment options including preventive and acute medications, natural supplements, and physical therapy. Discussed medication side effects, adverse reactions and drug interactions. Written educational materials and patient instructions outlining all of the above were given.  Follow-up: 3 months   Genia Harold, MD 01/01/2021   2:52 PM

## 2021-01-01 NOTE — Patient Instructions (Signed)
Start Topamax 25 mg (1 pill) at bedtime for one week, then increase to 50 mg (2 pills) at bedtime for one week, then increase to 75 mg (3 pills) at bedtime for one week, then increase to 100 mg (4 pills) at bedtime MRI brain

## 2021-01-03 ENCOUNTER — Encounter: Payer: Self-pay | Admitting: Physician Assistant

## 2021-01-03 ENCOUNTER — Other Ambulatory Visit: Payer: Self-pay

## 2021-01-03 ENCOUNTER — Ambulatory Visit (INDEPENDENT_AMBULATORY_CARE_PROVIDER_SITE_OTHER): Payer: 59 | Admitting: Physician Assistant

## 2021-01-03 VITALS — BP 121/76 | HR 80 | Temp 97.8°F | Ht 60.0 in | Wt 208.0 lb

## 2021-01-03 DIAGNOSIS — Z0000 Encounter for general adult medical examination without abnormal findings: Secondary | ICD-10-CM

## 2021-01-03 DIAGNOSIS — E559 Vitamin D deficiency, unspecified: Secondary | ICD-10-CM | POA: Diagnosis not present

## 2021-01-03 DIAGNOSIS — E785 Hyperlipidemia, unspecified: Secondary | ICD-10-CM

## 2021-01-03 NOTE — Patient Instructions (Signed)
Preventive Care 92-53 Years Old, Female Preventive care refers to lifestyle choices and visits with your health care provider that can promote health and wellness. Preventive care visits are also called wellness exams. What can I expect for my preventive care visit? Counseling Your health care provider may ask you questions about your: Medical history, including: Past medical problems. Family medical history. Pregnancy history. Current health, including: Menstrual cycle. Method of birth control. Emotional well-being. Home life and relationship well-being. Sexual activity and sexual health. Lifestyle, including: Alcohol, nicotine or tobacco, and drug use. Access to firearms. Diet, exercise, and sleep habits. Work and work Statistician. Sunscreen use. Safety issues such as seatbelt and bike helmet use. Physical exam Your health care provider will check your: Height and weight. These may be used to calculate your BMI (body mass index). BMI is a measurement that tells if you are at a healthy weight. Waist circumference. This measures the distance around your waistline. This measurement also tells if you are at a healthy weight and may help predict your risk of certain diseases, such as type 2 diabetes and high blood pressure. Heart rate and blood pressure. Body temperature. Skin for abnormal spots. What immunizations do I need? Vaccines are usually given at various ages, according to a schedule. Your health care provider will recommend vaccines for you based on your age, medical history, and lifestyle or other factors, such as travel or where you work. What tests do I need? Screening Your health care provider may recommend screening tests for certain conditions. This may include: Lipid and cholesterol levels. Diabetes screening. This is done by checking your blood sugar (glucose) after you have not eaten for a while (fasting). Pelvic exam and Pap test. Hepatitis B test. Hepatitis C  test. HIV (human immunodeficiency virus) test. STI (sexually transmitted infection) testing, if you are at risk. Lung cancer screening. Colorectal cancer screening. Mammogram. Talk with your health care provider about when you should start having regular mammograms. This may depend on whether you have a family history of breast cancer. BRCA-related cancer screening. This may be done if you have a family history of breast, ovarian, tubal, or peritoneal cancers. Bone density scan. This is done to screen for osteoporosis. Talk with your health care provider about your test results, treatment options, and if necessary, the need for more tests. Follow these instructions at home: Eating and drinking  Eat a diet that includes fresh fruits and vegetables, whole grains, lean protein, and low-fat dairy products. Take vitamin and mineral supplements as recommended by your health care provider. Do not drink alcohol if: Your health care provider tells you not to drink. You are pregnant, may be pregnant, or are planning to become pregnant. If you drink alcohol: Limit how much you have to 0-1 drink a day. Know how much alcohol is in your drink. In the [place] one drink equals one 12 oz bottle of beer (355 mL), one 5 oz glass of wine (148 mL), or one 1 oz glass of hard liquor (44 mL). Lifestyle Brush your teeth every morning and night with fluoride toothpaste. Floss one time each day. Exercise for at least 30 minutes 5 or more days each week. Do not use any products that contain nicotine or tobacco. These products include cigarettes, chewing tobacco, and vaping devices, such as e-cigarettes. If you need help quitting, ask your health care provider. Do not use drugs. If you are sexually active, practice safe sex. Use a condom or other form of protection to prevent  STIs. If you do not wish to become pregnant, use a form of birth control. If you plan to become pregnant, see your health care provider for a  prepregnancy visit. Take aspirin only as told by your health care provider. Make sure that you understand how much to take and what form to take. Work with your health care provider to find out whether it is safe and beneficial for you to take aspirin daily. Find healthy ways to manage stress, such as: Meditation, yoga, or listening to music. Journaling. Talking to a trusted person. Spending time with friends and family. Minimize exposure to UV radiation to reduce your risk of skin cancer. Safety Always wear your seat belt while driving or riding in a vehicle. Do not drive: If you have been drinking alcohol. Do not ride with someone who has been drinking. When you are tired or distracted. While texting. If you have been using any mind-altering substances or drugs. Wear a helmet and other protective equipment during sports activities. If you have firearms in your house, make sure you follow all gun safety procedures. Seek help if you have been physically or sexually abused. What's next? Visit your health care provider once a year for an annual wellness visit. Ask your health care provider how often you should have your eyes and teeth checked. Stay up to date on all vaccines. This information is not intended to replace advice given to you by your health care provider. Make sure you discuss any questions you have with your health care provider. Document Revised: 07/31/2020 Document Reviewed: 07/31/2020 Elsevier Patient Education  [employer].

## 2021-01-03 NOTE — Progress Notes (Signed)
Subjective:     Angela Ramsey is a 55 y.o. female and is here for a comprehensive physical exam. The patient reports  continues to have headache, double vision and jaw pain .  Social History   Socioeconomic History   Marital status: Married    Spouse name: Elberta Fortis   Number of children: 2   Years of education: Not on file   Highest education level: High school graduate  Occupational History   Not on file  Tobacco Use   Smoking status: Never   Smokeless tobacco: Never  Vaping Use   Vaping Use: Never used  Substance and Sexual Activity   Alcohol use: Not Currently    Comment: 1 every other week or less   Drug use: No   Sexual activity: Yes    Partners: Male    Birth control/protection: Surgical    Comment: 1st intercourse 66 yo-5 partners- married- 52 yrs   Other Topics Concern   Not on file  Social History Narrative   Lives with husband   Caffeine- 30 oz Coke a day   Social Determinants of Health   Financial Resource Strain: Not on file  Food Insecurity: Not on file  Transportation Needs: Not on file  Physical Activity: Not on file  Stress: Not on file  Social Connections: Not on file  Intimate Partner Violence: Not on file   Health Maintenance  Topic Date Due   COVID-19 Vaccine (1) Never done   INFLUENZA VACCINE  09/16/2020   MAMMOGRAM  06/18/2021   COLONOSCOPY (Pts 45-60yrs Insurance coverage will need to be confirmed)  07/27/2021   PAP SMEAR-Modifier  01/02/2022   TETANUS/TDAP  12/29/2025   Hepatitis C Screening  Completed   HIV Screening  Completed   Zoster Vaccines- Shingrix  Completed   Pneumococcal Vaccine 10-64 Years old  Aged Out   HPV VACCINES  Aged Out    The following portions of the patient's history were reviewed and updated as appropriate: allergies, current medications, past family history, past medical history, past social history, past surgical history, and problem list.  Review of Systems Pertinent items noted in HPI and remainder  of comprehensive ROS otherwise negative.   Objective:    BP 121/76   Pulse 80   Temp 97.8 F (36.6 C)   Ht 5' (1.524 m)   Wt 208 lb (94.3 kg)   LMP 12/25/2015 (Exact Date)   SpO2 97%   BMI 40.62 kg/m  General appearance: alert, cooperative, and no distress Head: Normocephalic, without obvious abnormality, atraumatic Eyes: conjunctivae/corneas clear. PERRL, EOM's intact. Fundi benign. Ears: normal TM's and external ear canals both ears Nose: Nares normal. Septum midline. Mucosa normal. No drainage or sinus tenderness. Throat: lips, mucosa, and tongue normal; teeth and gums normal Neck: no adenopathy, no JVD, supple, symmetrical, trachea midline, and thyroid: normal to inspection and palpation Back: symmetric, no curvature. ROM normal. No CVA tenderness. Lungs: clear to auscultation bilaterally Heart: regular rate and rhythm and S1, S2 normal Abdomen: soft, non-tender; bowel sounds normal; no masses,  no organomegaly Extremities: extremities normal, atraumatic, no cyanosis or edema Pulses: 2+ and symmetric Skin: Skin color, texture, turgor normal. No rashes or lesions Lymph nodes: Cervical adenopathy: normal and Supraclavicular adenopathy: normal Neurologic: Grossly normal    Assessment:    Healthy female exam.     Plan:  -UTD mammogram, pap (followed by Ob/Gyn), colonoscopy, Tdap, Shingrix, Hep C and HIV screenings. Declined influenza. -Will collect routing fasting labs. -Recommend to continue weight  loss efforts with dietary changes. Discussed heart healthy diet. -Stay as active as possible. -Stay well hydrated. -Continue to follow up with neurology. -Follow up in 6 months for HLD, Vit D def, HA See After Visit Summary for Counseling Recommendations

## 2021-01-04 LAB — VITAMIN D 25 HYDROXY (VIT D DEFICIENCY, FRACTURES): Vit D, 25-Hydroxy: 65.5 ng/mL (ref 30.0–100.0)

## 2021-01-04 LAB — CBC
Hematocrit: 44.9 % (ref 34.0–46.6)
Hemoglobin: 14.5 g/dL (ref 11.1–15.9)
MCH: 28.9 pg (ref 26.6–33.0)
MCHC: 32.3 g/dL (ref 31.5–35.7)
MCV: 89 fL (ref 79–97)
Platelets: 446 x10E3/uL (ref 150–450)
RBC: 5.02 x10E6/uL (ref 3.77–5.28)
RDW: 12.7 % (ref 11.7–15.4)
WBC: 12 x10E3/uL — ABNORMAL HIGH (ref 3.4–10.8)

## 2021-01-04 LAB — LIPID PANEL
Chol/HDL Ratio: 4 ratio (ref 0.0–4.4)
Cholesterol, Total: 259 mg/dL — ABNORMAL HIGH (ref 100–199)
HDL: 65 mg/dL (ref >39)
LDL Chol Calc (NIH): 173 mg/dL — ABNORMAL HIGH (ref 0–99)
Triglycerides: 118 mg/dL (ref 0–149)
VLDL Cholesterol Cal: 21 mg/dL (ref 5–40)

## 2021-01-04 LAB — COMPREHENSIVE METABOLIC PANEL WITH GFR
ALT: 18 IU/L (ref 0–32)
AST: 21 IU/L (ref 0–40)
Albumin/Globulin Ratio: 1.6 (ref 1.2–2.2)
Albumin: 4.4 g/dL (ref 3.8–4.9)
Alkaline Phosphatase: 134 IU/L — ABNORMAL HIGH (ref 44–121)
BUN/Creatinine Ratio: 13 (ref 9–23)
BUN: 12 mg/dL (ref 6–24)
Bilirubin Total: 0.4 mg/dL (ref 0.0–1.2)
CO2: 25 mmol/L (ref 20–29)
Calcium: 9.6 mg/dL (ref 8.7–10.2)
Chloride: 102 mmol/L (ref 96–106)
Creatinine, Ser: 0.94 mg/dL (ref 0.57–1.00)
Globulin, Total: 2.8 g/dL (ref 1.5–4.5)
Glucose: 93 mg/dL (ref 70–99)
Potassium: 4.1 mmol/L (ref 3.5–5.2)
Sodium: 143 mmol/L (ref 134–144)
Total Protein: 7.2 g/dL (ref 6.0–8.5)
eGFR: 73 mL/min/1.73

## 2021-01-04 LAB — HEMOGLOBIN A1C
Est. average glucose Bld gHb Est-mCnc: 114 mg/dL
Hgb A1c MFr Bld: 5.6 % (ref 4.8–5.6)

## 2021-01-04 LAB — TSH: TSH: 1.83 u[IU]/mL (ref 0.450–4.500)

## 2021-01-13 ENCOUNTER — Telehealth: Payer: Self-pay | Admitting: Psychiatry

## 2021-01-13 NOTE — Telephone Encounter (Signed)
MR Brain w/wo contrast Dr. Dewitt Hoes Josem Kaufmann: [place] via [facility]. Patient is scheduled at [facility] for 01/14/21.

## 2021-01-14 ENCOUNTER — Ambulatory Visit: Payer: 59

## 2021-01-14 DIAGNOSIS — H532 Diplopia: Secondary | ICD-10-CM

## 2021-01-14 MED ORDER — GADOBENATE DIMEGLUMINE 529 MG/ML IV SOLN
20.0000 mL | Freq: Once | INTRAVENOUS | Status: AC | PRN
  Administered 2021-01-14: 20 mL via INTRAVENOUS

## 2021-01-19 ENCOUNTER — Other Ambulatory Visit: Payer: Self-pay | Admitting: Obstetrics & Gynecology

## 2021-01-20 NOTE — Telephone Encounter (Signed)
Medication refill request: prozac 40mg   Last AEX:  01/10/20  Next AEX: 01/24/21  Last MMG (if hormonal medication request): 06/18/20 Bi-rads 1 neg  Refill authorized: #[id number] with 0 rf pended for today.

## 2021-01-21 ENCOUNTER — Encounter: Payer: Self-pay | Admitting: Psychiatry

## 2021-01-21 DIAGNOSIS — M542 Cervicalgia: Secondary | ICD-10-CM

## 2021-01-21 DIAGNOSIS — R6884 Jaw pain: Secondary | ICD-10-CM

## 2021-01-23 ENCOUNTER — Ambulatory Visit: Payer: 59 | Admitting: Psychiatry

## 2021-01-24 ENCOUNTER — Other Ambulatory Visit: Payer: Self-pay

## 2021-01-24 ENCOUNTER — Encounter: Payer: Self-pay | Admitting: Obstetrics & Gynecology

## 2021-01-24 ENCOUNTER — Ambulatory Visit (INDEPENDENT_AMBULATORY_CARE_PROVIDER_SITE_OTHER): Payer: 59 | Admitting: Obstetrics & Gynecology

## 2021-01-24 VITALS — BP 118/74 | HR 71 | Resp 16 | Ht 59.75 in | Wt 203.0 lb

## 2021-01-24 DIAGNOSIS — Z6839 Body mass index (BMI) 39.0-39.9, adult: Secondary | ICD-10-CM

## 2021-01-24 DIAGNOSIS — Z780 Asymptomatic menopausal state: Secondary | ICD-10-CM

## 2021-01-24 DIAGNOSIS — Z803 Family history of malignant neoplasm of breast: Secondary | ICD-10-CM

## 2021-01-24 DIAGNOSIS — Z01419 Encounter for gynecological examination (general) (routine) without abnormal findings: Secondary | ICD-10-CM

## 2021-01-24 DIAGNOSIS — E6609 Other obesity due to excess calories: Secondary | ICD-10-CM

## 2021-01-24 MED ORDER — FLUOXETINE HCL 40 MG PO CAPS: ORAL_CAPSULE | ORAL | 4 refills | Status: DC

## 2021-01-24 NOTE — Progress Notes (Signed)
Angela Ramsey [birth year] [id number]   History:    55 y.o.  G2P2L2 Married.  Son coming for Christmas with his family.  Daughter is pregnant with twins.   RP: Established patient presenting for annual gynecologic exam   HPI: Postmenopausal, well on no HRT.   [place] was 56 in June 2020.  No PMB.  S/P Endometrial Ablation.  Applied Silver Nitrate on Cervix on 02/24/2019.  No longer having any post coital spotting.  No pelvic pain, occasional cramping.  Pap negative 12/2019.  Breasts normal.  Mammo Neg 06/2020.  Strong fam h/o Breast Ca, patient does MRI of breasts annually.  [place] 2020. Health labs with Fam MD.  BMI 39.98.  Depression stable on Prozac.  Past medical history,surgical history, family history and social history were all reviewed and documented in the EPIC chart.  Gynecologic History Patient's last menstrual period was 12/25/2015 (exact date).  Obstetric History OB History  Gravida Para Term Preterm AB Living  2 2 2     2   SAB IAB Ectopic Multiple Live Births               # Outcome Date GA Lbr Len/2nd Weight Sex Delivery Anes PTL Lv  2 Term           1 Term              ROS: A ROS was performed and pertinent positives and negatives are included in the history.  GENERAL: No fevers or chills. HEENT: No change in vision, no earache, sore throat or sinus congestion. NECK: No pain or stiffness. CARDIOVASCULAR: No chest pain or pressure. No palpitations. PULMONARY: No shortness of breath, cough or wheeze. GASTROINTESTINAL: No abdominal pain, nausea, vomiting or diarrhea, melena or bright red blood per rectum. GENITOURINARY: No urinary frequency, urgency, hesitancy or dysuria. MUSCULOSKELETAL: No joint or muscle pain, no back pain, no recent trauma. DERMATOLOGIC: No rash, no itching, no lesions. ENDOCRINE: No polyuria, polydipsia, no heat or cold intolerance. No recent change in weight. HEMATOLOGICAL: No anemia or easy bruising or bleeding. NEUROLOGIC: No headache, seizures,  numbness, tingling or weakness. PSYCHIATRIC: No depression, no loss of interest in normal activity or change in sleep pattern.     Exam:   BP 118/74   Pulse 71   Resp 16   Ht 4' 11.75" (1.518 m)   Wt 203 lb (92.1 kg)   LMP 12/25/2015 (Exact Date)   BMI 39.98 kg/m   Body mass index is 39.98 kg/m.  General appearance : Well developed well nourished female. No acute distress HEENT: Eyes: no retinal hemorrhage or exudates,  Neck supple, trachea midline, no carotid bruits, no thyroidmegaly Lungs: Clear to auscultation, no rhonchi or wheezes, or rib retractions  Heart: Regular rate and rhythm, no murmurs or gallops Breast:Examined in sitting and supine position were symmetrical in appearance, no palpable masses or tenderness,  no skin retraction, no nipple inversion, no nipple discharge, no skin discoloration, no axillary or supraclavicular lymphadenopathy Abdomen: no palpable masses or tenderness, no rebound or guarding Extremities: no edema or skin discoloration or tenderness  Pelvic: Vulva: Normal             Vagina: No gross lesions or discharge  Cervix: No gross lesions or discharge  Uterus  AV, normal size, shape and consistency, non-tender and mobile  Adnexa  Without masses or tenderness  Anus: Normal   Assessment/Plan:  55 y.o. female for annual exam   1. Well female exam with routine  gynecological exam Postmenopausal, well on no HRT.   [place] was 86 in June 2020.  No PMB.  S/P Endometrial Ablation.  Applied Silver Nitrate on Cervix on 02/24/2019.  No longer having any post coital spotting.  No pelvic pain, occasional cramping.  Pap negative 12/2019.  Breasts normal.  Mammo Neg 06/2020.  Strong fam h/o Breast Ca, patient does MRI of breasts annually.  [place] 2020. Health labs with Fam MD.  BMI 39.98.  Depression stable on Prozac. Prescription sent to pharmacy.  2. Postmenopause Postmenopausal, well on no HRT.   [place] was 22 in June 2020.  No PMB.  S/P Endometrial Ablation. Vit D  supplements, Ca++ 1.5 g/d total, regular weight bearing physical activities.  3. Family history of breast cancer MRI of breasts requested.  4. Class 2 obesity due to excess calories without serious comorbidity with body mass index (BMI) of 39.0 to 39.9 in adult  Recommend a lower calorie/carb diet.  Increase fitness activities.  Other orders - FLUoxetine (PROZAC) 40 MG capsule; TAKE 1 CAPSULE BY MOUTH EVERY DAY   Princess Bruins MD, 2:49 PM 01/24/2021

## 2021-01-25 ENCOUNTER — Encounter: Payer: Self-pay | Admitting: Obstetrics & Gynecology

## 2021-01-28 ENCOUNTER — Other Ambulatory Visit: Payer: Self-pay

## 2021-01-28 ENCOUNTER — Telehealth: Payer: Self-pay

## 2021-01-28 DIAGNOSIS — Z803 Family history of malignant neoplasm of breast: Secondary | ICD-10-CM

## 2021-01-28 NOTE — Telephone Encounter (Signed)
Pt returned call. Notified that order for MRI was placed and she would need to call and schedule. Number to [facility] provided.

## 2021-01-28 NOTE — Telephone Encounter (Signed)
Schedule Breast MRI Received: 4 days ago Angela Bruins, MD  P [facility] Triage  Strong family h/o Breast Ca.  MRI of breasts annually

## 2021-01-28 NOTE — Telephone Encounter (Signed)
MRI order placed. Left message for patient to call.

## 2021-02-03 ENCOUNTER — Other Ambulatory Visit: Payer: 59

## 2021-02-03 ENCOUNTER — Other Ambulatory Visit: Payer: Self-pay

## 2021-02-03 DIAGNOSIS — D72829 Elevated white blood cell count, unspecified: Secondary | ICD-10-CM

## 2021-02-04 LAB — CBC WITH DIFFERENTIAL/PLATELET
Basophils Absolute: 0 10*3/uL (ref 0.0–0.2)
Basos: 0 %
EOS (ABSOLUTE): 0.1 10*3/uL (ref 0.0–0.4)
Eos: 2 %
Hematocrit: 41.4 % (ref 34.0–46.6)
Hemoglobin: 13.7 g/dL (ref 11.1–15.9)
Immature Grans (Abs): 0 10*3/uL (ref 0.0–0.1)
Immature Granulocytes: 0 %
Lymphocytes Absolute: 3 10*3/uL (ref 0.7–3.1)
Lymphs: 33 %
MCH: 29.7 pg (ref 26.6–33.0)
MCHC: 33.1 g/dL (ref 31.5–35.7)
MCV: 90 fL (ref 79–97)
Monocytes Absolute: 0.4 10*3/uL (ref 0.1–0.9)
Monocytes: 4 %
Neutrophils Absolute: 5.5 10*3/uL (ref 1.4–7.0)
Neutrophils: 61 %
Platelets: 360 10*3/uL (ref 150–450)
RBC: 4.61 x10E6/uL (ref 3.77–5.28)
RDW: 12.6 % (ref 11.7–15.4)
WBC: 9 10*3/uL (ref 3.4–10.8)

## 2021-02-06 ENCOUNTER — Ambulatory Visit: Payer: 59 | Admitting: Physical Therapy

## 2021-02-12 NOTE — Telephone Encounter (Signed)
MRI scheduled 02/18/21 at [facility]. They have a note in the appt notes that PA not required. Routed to [facility] for her review and just FYI.

## 2021-02-16 ENCOUNTER — Other Ambulatory Visit: Payer: 59

## 2021-02-18 ENCOUNTER — Other Ambulatory Visit: Payer: 59

## 2021-03-05 ENCOUNTER — Other Ambulatory Visit: Payer: Self-pay

## 2021-03-05 ENCOUNTER — Ambulatory Visit (INDEPENDENT_AMBULATORY_CARE_PROVIDER_SITE_OTHER): Payer: 59 | Admitting: Physician Assistant

## 2021-03-05 ENCOUNTER — Encounter: Payer: Self-pay | Admitting: Physician Assistant

## 2021-03-05 VITALS — BP 123/83 | HR 72 | Temp 98.9°F | Ht 60.0 in | Wt 200.0 lb

## 2021-03-05 DIAGNOSIS — R051 Acute cough: Secondary | ICD-10-CM

## 2021-03-05 DIAGNOSIS — J0120 Acute ethmoidal sinusitis, unspecified: Secondary | ICD-10-CM

## 2021-03-05 MED ORDER — HYDROCOD POLI-CHLORPHE POLI ER 10-8 MG/5ML PO SUER: 5.0000 mL | Freq: Two times a day (BID) | ORAL | 0 refills | Status: DC | PRN

## 2021-03-05 MED ORDER — AZITHROMYCIN 250 MG PO TABS: ORAL_TABLET | ORAL | 0 refills | Status: AC

## 2021-03-05 NOTE — Progress Notes (Signed)
Acute Office Visit  Subjective:    Patient ID: Angela Ramsey, female    DOB: 1965/04/03, 56 y.o.   MRN: [id number]  Chief Complaint  Patient presents with   Acute Visit   Nasal Congestion   Generalized Body Aches   Cough    HPI Patient is in today for c/o cough, chest pain, sore throat, chills, body aches, fatigue and congestion, runny nose, and sinus pressure. Reports low grade fever (less than 102). Reports started 6 weeks ago. Was feeling better until this past weekend, felt worse. Also reports nausea and vomiting related to the coughing attacks. Has tried Mucinex, Copywriter, advertising Plus, Nyquil and Dayquil, and cold/flu symptom relief which have been ineffective. Does report some chest wall pain with deep breathing. Reports did an at-home Covid test today which resulted negative.   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   ATM gene called c.7187C>G  mutation of unknown significance see genetic counseling note 09/21/2014    Past Surgical History:  Procedure Laterality Date   BREAST BIOPSY Left 2016   BREAST SURGERY     Breast Bx benign   FOOT SURGERY Left    plantar fasciitis   NOVASURE ABLATION  10/2004   PELVIC LAPAROSCOPY  12/2002   endometriosis    Family History  Problem Relation Age of Onset   Hypertension Mother    Hypertension Father    Hyperlipidemia Father    Stroke Father    Thyroid cancer Sister        dx in her early to mid 39s   Cancer Sister        thyroid   Breast cancer Maternal Aunt 67       mother's identical twin   Cancer Maternal Aunt        breast   Breast cancer Maternal Aunt 49       second cancer at  58   Cancer Maternal Aunt        breast x 2   Breast cancer Paternal Aunt 86   Breast cancer Maternal Grandmother        dx in her late 79s to early 19s   Cancer Maternal Grandmother        breast   Cancer Maternal  Grandfather        lung   Diabetes Paternal Grandmother    Leukemia Paternal Grandfather    Cancer Paternal Grandfather        leukemia   Colon cancer Neg Hx    Esophageal cancer Neg Hx    Rectal cancer Neg Hx    Stomach cancer Neg Hx     Social History   Socioeconomic History   Marital status: Married    Spouse name: Elberta Fortis   Number of children: 2   Years of education: Not on file   Highest education level: High school graduate  Occupational History   Not on file  Tobacco Use   Smoking status: Never   Smokeless tobacco: Never  Vaping Use   Vaping Use: Never used  Substance and Sexual Activity   Alcohol use: Yes    Comment: 1 every other week or less   Drug use: No   Sexual activity: Yes    Partners: Male    Birth control/protection: Other-see comments, Post-menopausal    Comment: 1st  intercourse 89 yo-5 partners- married- 2 yrs, husband vasectomy, ablation  Other Topics Concern   Not on file  Social History Narrative   Lives with husband   Caffeine- 30 oz Coke a day   Social Determinants of Health   Financial Resource Strain: Not on file  Food Insecurity: Not on file  Transportation Needs: Not on file  Physical Activity: Not on file  Stress: Not on file  Social Connections: Not on file  Intimate Partner Violence: Not on file    Outpatient Medications Prior to Visit  Medication Sig Dispense Refill   B Complex Vitamins (VITAMIN B COMPLEX PO) Take by mouth.     Cholecalciferol (VITAMIN D) 50 MCG (2000 UT) tablet Take 1 tablet (2,000 Units total) by mouth daily.     FLUoxetine (PROZAC) 40 MG capsule TAKE 1 CAPSULE BY MOUTH EVERY DAY 90 capsule 4   Multiple Vitamins-Minerals (ALIVE MULTI-VITAMIN) CHEW Chew by mouth.     topiramate (TOPAMAX) 25 MG tablet Start Topamax 25 mg (1 pill) at bedtime for one week, then increase to 50 mg (2 pills) at bedtime for one week, then increase to 75 mg (3 pills) at bedtime for one week, then increase to 100 mg (4 pills) at  bedtime 120 tablet 3   triamcinolone (KENALOG) 0.025 % ointment Apply 1 application topically at bedtime. (Patient taking differently: Apply 1 application topically as needed.) 30 g 0   No facility-administered medications prior to visit.    No Known Allergies  Review of Systems A fourteen system review of systems was performed and found to be positive as per HPI.    Objective:    Physical Exam General:  Well Developed, well nourished, appropriate for stated age.  Neuro:  Alert and oriented,  extra-ocular muscles intact  HEENT:  Normocephalic, atraumatic, tenderness of ethmoid sinus, PERRL, normal TM's of both ears, boggy and erythematous turbinates (L>R), mild erythema of posterior oropharynx, +cervical adenopathy  Skin:  no gross rash, warm, pink. Cardiac:  RRR, S1 S2 Respiratory:  CTA B/L w/o wheezing, crackles or rales. Vascular:  Ext warm, no cyanosis apprec.; cap RF less 2 sec. Psych:  No HI/SI, judgement and insight good, Euthymic mood. Full Affect.  BP 123/83    Pulse 72    Temp 98.9 F (37.2 C)    Ht 5' (1.524 m)    Wt 200 lb (90.7 kg)    LMP 12/25/2015 (Exact Date)    SpO2 99%    BMI 39.06 kg/m  Wt Readings from Last 3 Encounters:  03/05/21 200 lb (90.7 kg)  01/24/21 203 lb (92.1 kg)  01/03/21 208 lb (94.3 kg)    Health Maintenance Due  Topic Date Due   COVID-19 Vaccine (1) Never done   INFLUENZA VACCINE  09/16/2020    There are no preventive care reminders to display for this patient.   Lab Results  Component Value Date   TSH 1.830 01/03/2021   Lab Results  Component Value Date   WBC 9.0 02/03/2021   HGB 13.7 02/03/2021   HCT 41.4 02/03/2021   MCV 90 02/03/2021   PLT 360 02/03/2021   Lab Results  Component Value Date   NA 143 01/03/2021   K 4.1 01/03/2021   CO2 25 01/03/2021   GLUCOSE 93 01/03/2021   BUN 12 01/03/2021   CREATININE 0.94 01/03/2021   BILITOT 0.4 01/03/2021   ALKPHOS 134 (H) 01/03/2021   AST 21 01/03/2021   ALT 18 01/03/2021    PROT 7.2  01/03/2021   ALBUMIN 4.4 01/03/2021   CALCIUM 9.6 01/03/2021   ANIONGAP 9 09/26/2015   EGFR 73 01/03/2021   Lab Results  Component Value Date   CHOL 259 (H) 01/03/2021   Lab Results  Component Value Date   HDL 65 01/03/2021   Lab Results  Component Value Date   LDLCALC 173 (H) 01/03/2021   Lab Results  Component Value Date   TRIG 118 01/03/2021   Lab Results  Component Value Date   CHOLHDL 4.0 01/03/2021   Lab Results  Component Value Date   HGBA1C 5.6 01/03/2021       Assessment & Plan:   Problem List Items Addressed This Visit   None Visit Diagnoses     Acute cough    -  Primary   Relevant Medications   chlorpheniramine-HYDROcodone (TUSSIONEX PENNKINETIC ER) 10-8 MG/5ML   Other Relevant Orders   POCT Influenza A/B   Subacute ethmoidal sinusitis       Relevant Medications   azithromycin (ZITHROMAX) 250 MG tablet   chlorpheniramine-HYDROcodone (TUSSIONEX PENNKINETIC ER) 10-8 MG/5ML      Influenza swab collected to recent worsening symptoms to evaluate for potential exposure which resulted negative. Will start antibiotic therapy for bacterial sinusitis due to worsening symptoms which started >10 days ago. Patient has tried several OTC cough medications and reports tessalon Perles have not worked in the past so recommend cautious use of Tussionex for cough relief. If symptoms fail to improve or worsen recommend further evaluation with chest x-ray and will consider corticosteroid therapy.    Meds ordered this encounter  Medications   azithromycin (ZITHROMAX) 250 MG tablet    Sig: Take 2 tablets on day 1, then 1 tablet daily on days 2 through 5    Dispense:  6 tablet    Refill:  0    Order Specific Question:   Supervising Provider    Answer:   Beatrice Lecher D [[id number]]   chlorpheniramine-HYDROcodone (TUSSIONEX PENNKINETIC ER) 10-8 MG/5ML    Sig: Take 5 mLs by mouth every 12 (twelve) hours as needed for cough.    Dispense:  70 mL    Refill:  0     Order Specific Question:   Supervising Provider    Answer:   Beatrice Lecher D [[id number]]     Lorrene Reid, PA-C

## 2021-03-05 NOTE — Patient Instructions (Signed)

## 2021-03-06 LAB — POCT INFLUENZA A/B
Influenza A, POC: NEGATIVE
Influenza B, POC: NEGATIVE

## 2021-03-10 ENCOUNTER — Encounter: Payer: Self-pay | Admitting: Physician Assistant

## 2021-03-10 NOTE — Progress Notes (Signed)
Acute Office Visit  Subjective:    Patient ID: Angela Ramsey, female    DOB: 02-17-1966, 56 y.o.   MRN: [id number]  Chief Complaint  Patient presents with   Acute Visit   Cough   Dizziness    HPI Patient is in today for c/o dizziness and cough. Patient reports gets dizzy when getting up in the mornings and sometimes with bending over. Dizziness was worst over the weekend but has improved, it started when she started the antibiotic and cough syrup. Cough is worse is at night. Still has postnasal drainage and nasal congestion.   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   ATM gene called c.7187C>G  mutation of unknown significance see genetic counseling note 09/21/2014    Past Surgical History:  Procedure Laterality Date   BREAST BIOPSY Left 2016   BREAST SURGERY     Breast Bx benign   FOOT SURGERY Left    plantar fasciitis   NOVASURE ABLATION  10/2004   PELVIC LAPAROSCOPY  12/2002   endometriosis    Family History  Problem Relation Age of Onset   Hypertension Mother    Hypertension Father    Hyperlipidemia Father    Stroke Father    Thyroid cancer Sister        dx in her early to mid 44s   Cancer Sister        thyroid   Breast cancer Maternal Aunt 67       mother's identical twin   Cancer Maternal Aunt        breast   Breast cancer Maternal Aunt 49       second cancer at  7   Cancer Maternal Aunt        breast x 2   Breast cancer Paternal Aunt 86   Breast cancer Maternal Grandmother        dx in her late 67s to early 74s   Cancer Maternal Grandmother        breast   Cancer Maternal Grandfather        lung   Diabetes Paternal Grandmother    Leukemia Paternal Grandfather    Cancer Paternal Grandfather        leukemia   Colon cancer Neg Hx    Esophageal cancer Neg Hx    Rectal cancer Neg Hx    Stomach cancer Neg Hx     Social  History   Socioeconomic History   Marital status: Married    Spouse name: Elberta Fortis   Number of children: 2   Years of education: Not on file   Highest education level: High school graduate  Occupational History   Not on file  Tobacco Use   Smoking status: Never   Smokeless tobacco: Never  Vaping Use   Vaping Use: Never used  Substance and Sexual Activity   Alcohol use: Yes    Comment: 1 every other week or less   Drug use: No   Sexual activity: Yes    Partners: Male    Birth control/protection: Other-see comments, Post-menopausal    Comment: 1st intercourse 60 yo-5 partners- married- 40 yrs, husband vasectomy, ablation  Other Topics Concern   Not on file  Social History Narrative   Lives with husband   Caffeine- 30 oz Coke a day   Social  Determinants of Health   Financial Resource Strain: Not on file  Food Insecurity: Not on file  Transportation Needs: Not on file  Physical Activity: Not on file  Stress: Not on file  Social Connections: Not on file  Intimate Partner Violence: Not on file    Outpatient Medications Prior to Visit  Medication Sig Dispense Refill   B Complex Vitamins (VITAMIN B COMPLEX PO) Take by mouth.     Cholecalciferol (VITAMIN D) 50 MCG (2000 UT) tablet Take 1 tablet (2,000 Units total) by mouth daily.     FLUoxetine (PROZAC) 40 MG capsule TAKE 1 CAPSULE BY MOUTH EVERY DAY 90 capsule 4   Multiple Vitamins-Minerals (ALIVE MULTI-VITAMIN) CHEW Chew by mouth.     topiramate (TOPAMAX) 25 MG tablet Start Topamax 25 mg (1 pill) at bedtime for one week, then increase to 50 mg (2 pills) at bedtime for one week, then increase to 75 mg (3 pills) at bedtime for one week, then increase to 100 mg (4 pills) at bedtime 120 tablet 3   triamcinolone (KENALOG) 0.025 % ointment Apply 1 application topically at bedtime. (Patient taking differently: Apply 1 application topically as needed.) 30 g 0   chlorpheniramine-HYDROcodone (TUSSIONEX PENNKINETIC ER) 10-8 MG/5ML Take 5  mLs by mouth every 12 (twelve) hours as needed for cough. 70 mL 0   No facility-administered medications prior to visit.    No Known Allergies  Review of Systems Review of Systems:  A fourteen system review of systems was performed and found to be positive as per HPI.   Objective:   Physical Exam General:  Well Developed, well nourished, appropriate for stated age.  Neuro:  Alert and oriented,  extra-ocular muscles intact, no nystagmus noted with peripheral gaze  HEENT:  Normocephalic, atraumatic, no frontal or maxillary sinus tenderness, mild ethmoid sinus tenderness, PERRL, conjunctiva normal, fluid behind both TM's, no adenopathy  Skin:  no gross rash, warm, pink. Cardiac:  RRR, S1 S2 Respiratory: CTA B/L w/o wheezing, rhonchi, crackles or rales. Vascular:  Ext warm, no cyanosis apprec.; cap RF less 2 sec. Psych:  No HI/SI, judgement and insight good, Euthymic mood. Full Affect.    BP 113/78    Pulse 86    Temp 98.2 F (36.8 C)    Ht 5' (1.524 m)    Wt 199 lb (90.3 kg)    LMP 12/25/2015 (Exact Date)    SpO2 97%    BMI 38.86 kg/m  Wt Readings from Last 3 Encounters:  03/11/21 199 lb (90.3 kg)  03/05/21 200 lb (90.7 kg)  01/24/21 203 lb (92.1 kg)    Health Maintenance Due  Topic Date Due   COVID-19 Vaccine (1) Never done   INFLUENZA VACCINE  09/16/2020    There are no preventive care reminders to display for this patient.   Lab Results  Component Value Date   TSH 1.830 01/03/2021   Lab Results  Component Value Date   WBC 9.0 02/03/2021   HGB 13.7 02/03/2021   HCT 41.4 02/03/2021   MCV 90 02/03/2021   PLT 360 02/03/2021   Lab Results  Component Value Date   NA 143 01/03/2021   K 4.1 01/03/2021   CO2 25 01/03/2021   GLUCOSE 93 01/03/2021   BUN 12 01/03/2021   CREATININE 0.94 01/03/2021   BILITOT 0.4 01/03/2021   ALKPHOS 134 (H) 01/03/2021   AST 21 01/03/2021   ALT 18 01/03/2021   PROT 7.2 01/03/2021   ALBUMIN 4.4 01/03/2021   CALCIUM  9.6 01/03/2021    ANIONGAP 9 09/26/2015   EGFR 73 01/03/2021   Lab Results  Component Value Date   CHOL 259 (H) 01/03/2021   Lab Results  Component Value Date   HDL 65 01/03/2021   Lab Results  Component Value Date   LDLCALC 173 (H) 01/03/2021   Lab Results  Component Value Date   TRIG 118 01/03/2021   Lab Results  Component Value Date   CHOLHDL 4.0 01/03/2021   Lab Results  Component Value Date   HGBA1C 5.6 01/03/2021       Assessment & Plan:   Problem List Items Addressed This Visit   None Visit Diagnoses     Subacute ethmoidal sinusitis    -  Primary   Relevant Medications   methylPREDNISolone (MEDROL DOSEPAK) 4 MG TBPK tablet      Reassurance provided there is clinical improvement. Patient reports dizziness has improved and has been manageable so deferred medication therapy at this time. If dizziness becomes debilitating or worsens then will send rx for meclizine. Patient verbalized understanding. Will start corticosteroid taper to help improve congestion. Advised to continue with supportive care for cough. If cough fails to improve or worsen then recommend further evaluation with chest x-ray. Pt verbalized understanding.   Meds ordered this encounter  Medications   methylPREDNISolone (MEDROL DOSEPAK) 4 MG TBPK tablet    Sig: Take as directed on package.    Dispense:  21 tablet    Refill:  0    Order Specific Question:   Supervising Provider    Answer:   Beatrice Lecher D [[id number]]     Lorrene Reid, PA-C

## 2021-03-11 ENCOUNTER — Other Ambulatory Visit: Payer: Self-pay

## 2021-03-11 ENCOUNTER — Encounter: Payer: Self-pay | Admitting: Physician Assistant

## 2021-03-11 ENCOUNTER — Ambulatory Visit (INDEPENDENT_AMBULATORY_CARE_PROVIDER_SITE_OTHER): Payer: 59 | Admitting: Physician Assistant

## 2021-03-11 VITALS — BP 113/78 | HR 86 | Temp 98.2°F | Ht 60.0 in | Wt 199.0 lb

## 2021-03-11 DIAGNOSIS — R42 Dizziness and giddiness: Secondary | ICD-10-CM | POA: Diagnosis not present

## 2021-03-11 DIAGNOSIS — J0120 Acute ethmoidal sinusitis, unspecified: Secondary | ICD-10-CM

## 2021-03-11 MED ORDER — METHYLPREDNISOLONE 4 MG PO TBPK: ORAL_TABLET | ORAL | 0 refills | Status: DC

## 2021-03-24 ENCOUNTER — Ambulatory Visit: Payer: 59 | Admitting: Physician Assistant

## 2021-03-24 ENCOUNTER — Other Ambulatory Visit: Payer: Self-pay

## 2021-03-24 ENCOUNTER — Emergency Department
Admission: EM | Admit: 2021-03-24 | Discharge: 2021-03-24 | Disposition: A | Discharge status: Home / Self Care | Payer: 59 | Attending: Emergency Medicine | Admitting: Emergency Medicine

## 2021-03-24 ENCOUNTER — Encounter: Payer: Self-pay | Admitting: Emergency Medicine

## 2021-03-24 ENCOUNTER — Emergency Department: Payer: 59

## 2021-03-24 DIAGNOSIS — R079 Chest pain, unspecified: Secondary | ICD-10-CM

## 2021-03-24 DIAGNOSIS — J205 Acute bronchitis due to respiratory syncytial virus: Secondary | ICD-10-CM | POA: Diagnosis not present

## 2021-03-24 DIAGNOSIS — R0789 Other chest pain: Secondary | ICD-10-CM | POA: Insufficient documentation

## 2021-03-24 LAB — CBC
HCT: 42.6 % (ref 36.0–46.0)
Hemoglobin: 13.5 g/dL (ref 12.0–15.0)
MCH: 29.3 pg (ref 26.0–34.0)
MCHC: 31.7 g/dL (ref 30.0–36.0)
MCV: 92.6 fL (ref 80.0–100.0)
Platelets: 325 10*3/uL (ref 150–400)
RBC: 4.6 MIL/uL (ref 3.87–5.11)
RDW: 13.9 % (ref 11.5–15.5)
WBC: 10.7 10*3/uL — ABNORMAL HIGH (ref 4.0–10.5)
nRBC: 0 % (ref 0.0–0.2)

## 2021-03-24 LAB — BASIC METABOLIC PANEL
Anion gap: 8 (ref 5–15)
BUN: 18 mg/dL (ref 6–20)
CO2: 22 mmol/L (ref 22–32)
Calcium: 9 mg/dL (ref 8.9–10.3)
Chloride: 106 mmol/L (ref 98–111)
Creatinine, Ser: 0.88 mg/dL (ref 0.44–1.00)
GFR, Estimated: >60 mL/min (ref >60)
Glucose, Bld: 122 mg/dL — ABNORMAL HIGH (ref 70–99)
Potassium: 3.5 mmol/L (ref 3.5–5.1)
Sodium: 136 mmol/L (ref 135–145)

## 2021-03-24 LAB — TROPONIN I (HIGH SENSITIVITY)
Troponin I (High Sensitivity): 4 ng/L (ref <18)
Troponin I (High Sensitivity): 5 ng/L (ref <18)

## 2021-03-24 LAB — D-DIMER, QUANTITATIVE: D-Dimer, Quant: <0.27 ug/mL-FEU (ref 0.00–0.50)

## 2021-03-24 NOTE — ED Provider Notes (Addendum)
[facility] Provider Note    Event Date/Time   First MD Initiated Contact with Patient 03/24/21 1023     (approximate)   History   Chest Pain   HPI  Angela Ramsey is a 56 y.o. female with past medical history of temporal arteritis diagnosed over a month ago not currently on steroids and high cholesterol who presents for assessment of 4 days of left-sided chest pressure associate with some lightheadedness.  Patient states she has had about 7 weeks of cough and congestion seems to be a little better but is not completely gone away.  She states he was tested for flu and COVID but these were negative.  She was never diagnosed with pneumonia but was told she had a viral bronchitis.  She denies any history of COPD or asthma or any cardiac history.  No history of DVT/PE.  She has had a couple episodes of Perry tussive emesis but not any different today than usual last couple weeks.  She denies any abdominal pain, diarrhea, constipation, urinary symptoms, rash or extremity pain.  No other acute concerns at this time.      Physical Exam  Triage Vital Signs: ED Triage Vitals  Enc Vitals Group     BP 03/24/21 1012 140/88     Pulse Rate 03/24/21 1012 71     Resp 03/24/21 1012 18     Temp 03/24/21 1012 98.7 F (37.1 C)     Temp Source 03/24/21 1012 Oral     SpO2 03/24/21 1012 96 %     Weight 03/24/21 1011 199 lb (90.3 kg)     Height 03/24/21 1011 4\' 11"  (1.499 m)     Head Circumference --      Peak Flow --      Pain Score 03/24/21 1011 5     Pain Loc --      Pain Edu? --      Excl. in [place]? --     Most recent vital signs: Vitals:   03/24/21 1012  BP: 140/88  Pulse: 71  Resp: 18  Temp: 98.7 F (37.1 C)  SpO2: 96%    General: Awake, no distress.  CV:  Good peripheral perfusion.  No murmurs rubs or gallops. Resp:  Normal effort.  Abd:  No distention.  Soft throughout. Other:  Cranial nerves II through XII grossly intact.  No pronator drift.  No  finger dysmetria.  Symmetric 5/5 strength of all extremities.  Sensation intact to light touch in all extremities.  Unremarkable unassisted gait.    ED Results / Procedures / Treatments  Labs (all labs ordered are listed, but only abnormal results are displayed) Labs Reviewed  BASIC METABOLIC PANEL - Abnormal; Notable for the following components:      Result Value   Glucose, Bld 122 (*)    All other components within normal limits  CBC - Abnormal; Notable for the following components:   WBC 10.7 (*)    All other components within normal limits  D-DIMER, QUANTITATIVE  TROPONIN I (HIGH SENSITIVITY)  TROPONIN I (HIGH SENSITIVITY)     EKG  ECG is remarkable for sinus rhythm with a ventricular rate of 75, normal axis, nonspecific ST change in lead III and aVF without other clear evidence of acute ischemia or significant arrhythmia.   RADIOLOGY Chest reviewed by myself shows no focal consoidation, effusion, edema, pneumothorax or other clear acute thoracic process. I also reviewed radiology interpretation and agree with findings described.  PROCEDURES:  Critical Care performed: No  .1-3 Lead EKG Interpretation Performed by: Lucrezia Starch, MD Authorized by: Lucrezia Starch, MD     Interpretation: non-specific     ECG rate assessment: normal     Rhythm: sinus rhythm     Ectopy: none     Conduction: normal    The patient is on the cardiac monitor to evaluate for evidence of arrhythmia and/or significant heart rate changes.   MEDICATIONS ORDERED IN ED: Medications - No data to display   IMPRESSION / MDM / [place] / ED COURSE  I reviewed the triage vital signs and the nursing notes.                              Differential diagnosis includes, but is not limited to ACS, PE, pericarditis, myocarditis, pleurisy, GI etiologies, symptomatic effusion, pneumonia, ongoing bronchitis symptoms anemia and metabolic derangements.  ECG is remarkable for sinus  rhythm with a ventricular rate of 75, normal axis, nonspecific ST change in lead III and aVF without other clear evidence of acute ischemia or significant arrhythmia.  Given nonelevated troponin x2 Evalose suspicion for ACS or myocarditis.  Low suspicion for PE or other acute thrombotic process as D-dimer is undetectable  Chest reviewed by myself shows no focal consoidation, effusion, edema, pneumothorax or other clear acute thoracic process. I also reviewed radiology interpretation and agree with findings described.   BMP without any significant electrolyte or metabolic derangements.  CBC is unremarkable.  Given absence of fever and clear chest x-ray with very low suspicion for bacterial pneumonia.  Certainly possible patient has some ongoing bronchitic symptoms from a viral process although given stable vitals with otherwise reassuring exam work-up I do not believe she requires any additional emergent diagnostic studies or interventions and I think she is stable for discharge with continued outpatient evaluation.  Discharged in stable condition.  Strict return precautions advised and discussed pain      FINAL CLINICAL IMPRESSION(S) / ED DIAGNOSES   Final diagnoses:  Chest pain, unspecified type     Rx / DC Orders   ED Discharge Orders     None        Note:  This document was prepared using Dragon voice recognition software and may include unintentional dictation errors.   Lucrezia Starch, MD 03/24/21 1317    Lucrezia Starch, MD 03/24/21 [phone]

## 2021-03-24 NOTE — ED Triage Notes (Signed)
Pt via POV from home. Pt c/o L sided CP that started intermittently on Friday. Denies radiation to arms or jaw. Pt states she recently gotten over a cough. Pt also endorses mild SOB. Denies any significant cardiac hx. Pt is A&OX4 and NAD.

## 2021-03-28 ENCOUNTER — Other Ambulatory Visit: Payer: Self-pay | Admitting: Psychiatry

## 2021-03-31 ENCOUNTER — Other Ambulatory Visit: Payer: Self-pay

## 2021-03-31 ENCOUNTER — Other Ambulatory Visit: Payer: Self-pay | Admitting: Physician Assistant

## 2021-03-31 ENCOUNTER — Encounter: Payer: Self-pay | Admitting: Physician Assistant

## 2021-03-31 ENCOUNTER — Ambulatory Visit (INDEPENDENT_AMBULATORY_CARE_PROVIDER_SITE_OTHER): Payer: 59 | Admitting: Physician Assistant

## 2021-03-31 VITALS — BP 129/77 | HR 71 | Temp 97.1°F | Ht 59.0 in | Wt 205.0 lb

## 2021-03-31 DIAGNOSIS — R42 Dizziness and giddiness: Secondary | ICD-10-CM | POA: Diagnosis not present

## 2021-03-31 DIAGNOSIS — R0789 Other chest pain: Secondary | ICD-10-CM | POA: Diagnosis not present

## 2021-03-31 NOTE — Progress Notes (Signed)
Established Patient Office Visit  Subjective:  Patient ID: Angela Ramsey, female    DOB: [birth year]  Age: 56 y.o. MRN: [id number]  CC:  Chief Complaint  Patient presents with   Follow-up    HPI Angela Ramsey presents for ED follow up. Patient complains of lightheadedness and "weird chest feelings" which she describes as grabbing sensation. Lightheadedness happens off and on which can occur at rest or walking. Episodes last a brief seconds and feels like she cannot get enough oxygen to her brain. Denies anxiety or panic attacks, no spinning sensation, palpitations, vision changes, headache, nausea, vomiting, ear ringing, earache, or syncope. Cough has significantly improved.   ED HPI: Angela Ramsey is a 56 y.o. female with past medical history of temporal arteritis diagnosed over a month ago not currently on steroids and high cholesterol who presents for assessment of 4 days of left-sided chest pressure associate with some lightheadedness.  Patient states she has had about 7 weeks of cough and congestion seems to be a little better but is not completely gone away.  She states he was tested for flu and COVID but these were negative.  She was never diagnosed with pneumonia but was told she had a viral bronchitis.  She denies any history of COPD or asthma or any cardiac history.  No history of DVT/PE.  She has had a couple episodes of Perry tussive emesis but not any different today than usual last couple weeks.  She denies any abdominal pain, diarrhea, constipation, urinary symptoms, rash or extremity pain.  No other acute concerns at this time.  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   ATM gene called c.[id number]  mutation of unknown significance see genetic counseling note 09/21/2014    Past Surgical History:  Procedure Laterality Date   BREAST BIOPSY Left  2016   BREAST SURGERY     Breast Bx benign   FOOT SURGERY Left    plantar fasciitis   NOVASURE ABLATION  10/2004   PELVIC LAPAROSCOPY  12/2002   endometriosis    Family History  Problem Relation Age of Onset   Hypertension Mother    Hypertension Father    Hyperlipidemia Father    Stroke Father    Thyroid cancer Sister        dx in her early to mid 29s   Cancer Sister        thyroid   Breast cancer Maternal Aunt 53       mother's identical twin   Cancer Maternal Aunt        breast   Breast cancer Maternal Aunt 49       second cancer at  65   Cancer Maternal Aunt        breast x 2   Breast cancer Paternal Aunt 86   Breast cancer Maternal Grandmother        dx in her late 10s to early 70s   Cancer Maternal Grandmother        breast   Cancer Maternal Grandfather        lung   Diabetes Paternal Grandmother    Leukemia Paternal Grandfather    Cancer Paternal Grandfather        leukemia   Colon cancer Neg Hx    Esophageal cancer Neg Hx  Rectal cancer Neg Hx    Stomach cancer Neg Hx     Social History   Socioeconomic History   Marital status: Married    Spouse name: Angela Ramsey   Number of children: 2   Years of education: Not on file   Highest education level: High school graduate  Occupational History   Not on file  Tobacco Use   Smoking status: Never   Smokeless tobacco: Never  Vaping Use   Vaping Use: Never used  Substance and Sexual Activity   Alcohol use: Yes    Comment: 1 every other week or less   Drug use: No   Sexual activity: Yes    Partners: Male    Birth control/protection: Other-see comments, Post-menopausal    Comment: 1st intercourse 10 yo-5 partners- married- 22 yrs, husband vasectomy, ablation  Other Topics Concern   Not on file  Social History Narrative   Lives with husband   Caffeine- 30 oz Coke a day   Social Determinants of Health   Financial Resource Strain: Not on file  Food Insecurity: Not on file  Transportation Needs: Not  on file  Physical Activity: Not on file  Stress: Not on file  Social Connections: Not on file  Intimate Partner Violence: Not on file    Outpatient Medications Prior to Visit  Medication Sig Dispense Refill   B Complex Vitamins (VITAMIN B COMPLEX PO) Take by mouth.     Cholecalciferol (VITAMIN D) 50 MCG (2000 UT) tablet Take 1 tablet (2,000 Units total) by mouth daily.     FLUoxetine (PROZAC) 40 MG capsule TAKE 1 CAPSULE BY MOUTH EVERY DAY 90 capsule 4   Multiple Vitamins-Minerals (ALIVE MULTI-VITAMIN) CHEW Chew by mouth.     topiramate (TOPAMAX) 25 MG tablet Start Topamax 25 mg (1 pill) at bedtime for one week, then increase to 50 mg (2 pills) at bedtime for one week, then increase to 75 mg (3 pills) at bedtime for one week, then increase to 100 mg (4 pills) at bedtime 120 tablet 3   triamcinolone (KENALOG) 0.025 % ointment Apply 1 application topically at bedtime. (Patient taking differently: Apply 1 application topically as needed.) 30 g 0   methylPREDNISolone (MEDROL DOSEPAK) 4 MG TBPK tablet Take as directed on package. 21 tablet 0   No facility-administered medications prior to visit.    No Known Allergies  ROS Review of Systems Review of Systems:  A fourteen system review of systems was performed and found to be positive as per HPI.    Objective:    Physical Exam General:  Well Developed, well nourished, in no acute distress Neuro:  Alert and oriented,  extra-ocular muscles intact, horizontal nystagmus with peripheral gaze noted, CN II-XII grossly intact   HEENT:  Normocephalic, atraumatic, neck supple  Skin:  no gross rash, warm, pink. Cardiac:  RRR, S1 S2, no murmur  Chest: normal excursion, tenderness of chest wall  Respiratory: CTA B/L  Vascular:  Ext warm, no cyanosis apprec.; cap RF less 2 sec. Psych:  No HI/SI, judgement and insight good, Euthymic mood. Full Affect.  BP 129/77    Pulse 71    Temp (!) 97.1 F (36.2 C)    Ht '4\' 11"'  (1.499 m)    Wt 205 lb (93 kg)     LMP 12/25/2015 (Exact Date)    SpO2 98%    BMI 41.40 kg/m  Wt Readings from Last 3 Encounters:  04/01/21 203 lb 9.6 oz (92.4 kg)  03/31/21 205 lb (  93 kg)  03/24/21 199 lb (90.3 kg)     Health Maintenance Due  Topic Date Due   COVID-19 Vaccine (1) Never done   INFLUENZA VACCINE  09/16/2020    There are no preventive care reminders to display for this patient.  Lab Results  Component Value Date   TSH 1.830 01/03/2021   Lab Results  Component Value Date   WBC 10.7 (H) 03/24/2021   HGB 13.5 03/24/2021   HCT 42.6 03/24/2021   MCV 92.6 03/24/2021   PLT 325 03/24/2021   Lab Results  Component Value Date   NA 136 03/24/2021   K 3.5 03/24/2021   CO2 22 03/24/2021   GLUCOSE 122 (H) 03/24/2021   BUN 18 03/24/2021   CREATININE 0.88 03/24/2021   BILITOT 0.4 01/03/2021   ALKPHOS 134 (H) 01/03/2021   AST 21 01/03/2021   ALT 18 01/03/2021   PROT 7.2 01/03/2021   ALBUMIN 4.4 01/03/2021   CALCIUM 9.0 03/24/2021   ANIONGAP 8 03/24/2021   EGFR 73 01/03/2021   Lab Results  Component Value Date   CHOL 259 (H) 01/03/2021   Lab Results  Component Value Date   HDL 65 01/03/2021   Lab Results  Component Value Date   LDLCALC 173 (H) 01/03/2021   Lab Results  Component Value Date   TRIG 118 01/03/2021   Lab Results  Component Value Date   CHOLHDL 4.0 01/03/2021   Lab Results  Component Value Date   HGBA1C 5.6 01/03/2021      Assessment & Plan:   Problem List Items Addressed This Visit   None Visit Diagnoses     Atypical chest pain    -  Primary   Relevant Orders   TSH   Comp Met (CMET)   Ambulatory referral to Cardiology   Intermittent lightheadedness       Relevant Orders   TSH   Comp Met (CMET)   Ambulatory referral to Cardiology      Atypical chest pain, intermittent lightheadedness: -Reviewed ED notes, labs and/or imaging. Patient does have chest wall tenderness more prominent on left side so possibly also MSK related. However, with symptoms of  abrupt intermittent lightheadedness recommend referral to cardiology for further evaluation. Patient is agreeable. Discussed adequate hydration. Will obtain additional labs to r/o endocrine or metabolic etiology. On exam mild nystagmus noted and patient became symptomatic feeling dizzy so discussed possibly vertigo-related. Patient is established with neurologist and also recommend discussing lightheadedness symptoms.  No orders of the defined types were placed in this encounter.   Follow-up: Return if symptoms worsen or fail to improve.    Lorrene Reid, PA-C

## 2021-03-31 NOTE — Patient Instructions (Addendum)
Dizziness Dizziness is a common problem. It is a feeling of unsteadiness or light-headedness. You may feel like you are about to faint. Dizziness can lead to injury if you stumble or fall. Anyone can become dizzy, but dizziness is more common in older adults. This condition can be caused by a number of things, including medicines, dehydration, or illness. Follow these instructions at home: Eating and drinking  Drink enough fluid to keep your urine pale yellow. This helps to keep you from becoming dehydrated. Try to drink more clear fluids, such as water. Do not drink alcohol. Limit your caffeine intake if told to do so by your health care provider. Check ingredients and nutrition facts to see if a food or beverage contains caffeine. Limit your salt (sodium) intake if told to do so by your health care provider. Check ingredients and nutrition facts to see if a food or beverage contains sodium. Activity  Avoid making quick movements. Rise slowly from chairs and steady yourself until you feel okay. In the morning, first sit up on the side of the bed. When you feel okay, stand slowly while you hold onto something until you know that your balance is good. If you need to stand in one place for a long time, move your legs often. Tighten and relax the muscles in your legs while you are standing. Do not drive or use machinery if you feel dizzy. Avoid bending down if you feel dizzy. Place items in your home so that they are easy for you to reach without leaning over. Lifestyle Do not use any products that contain nicotine or tobacco. These products include cigarettes, chewing tobacco, and vaping devices, such as e-cigarettes. If you need help quitting, ask your health care provider. Try to reduce your stress level by using methods such as yoga or meditation. Talk with your health care provider if you need help to manage your stress. General instructions Watch your dizziness for any changes. Take  over-the-counter and prescription medicines only as told by your health care provider. Talk with your health care provider if you think that your dizziness is caused by a medicine that you are taking. Tell a friend or a family member that you are feeling dizzy. If he or she notices any changes in your behavior, have this person call your health care provider. Keep all follow-up visits. This is important. Contact a health care provider if: Your dizziness does not go away or you have new symptoms. Your dizziness or light-headedness gets worse. You feel nauseous. You have reduced hearing. You have a fever. You have neck pain or a stiff neck. Your dizziness leads to an injury or a fall. Get help right away if: You vomit or have diarrhea and are unable to eat or drink anything. You have problems talking, walking, swallowing, or using your arms, hands, or legs. You feel generally weak. You have any bleeding. You are not thinking clearly or you have trouble forming sentences. It may take a friend or family member to notice this. You have chest pain, abdominal pain, shortness of breath, or sweating. Your vision changes or you develop a severe headache. These symptoms may represent a serious problem that is an emergency. Do not wait to see if the symptoms will go away. Get medical help right away. Call your local emergency services (911 in the [place] Do not drive yourself to the hospital. Summary Dizziness is a feeling of unsteadiness or light-headedness. This condition can be caused by a number of   things, including medicines, dehydration, or illness. Anyone can become dizzy, but dizziness is more common in older adults. Drink enough fluid to keep your urine pale yellow. Do not drink alcohol. Avoid making quick movements if you feel dizzy. Monitor your dizziness for any changes. This information is not intended to replace advice given to you by your health care provider. Make sure you discuss any  questions you have with your health care provider. Document Revised: 01/08/2020 Document Reviewed: 01/08/2020 Elsevier Patient Education  2022 Elsevier Inc[employer]  

## 2021-04-01 ENCOUNTER — Encounter: Payer: Self-pay | Admitting: Internal Medicine

## 2021-04-01 ENCOUNTER — Ambulatory Visit (INDEPENDENT_AMBULATORY_CARE_PROVIDER_SITE_OTHER): Payer: 59 | Admitting: Internal Medicine

## 2021-04-01 VITALS — BP 108/70 | HR 78 | Ht 59.0 in | Wt 203.6 lb

## 2021-04-01 DIAGNOSIS — R42 Dizziness and giddiness: Secondary | ICD-10-CM | POA: Diagnosis not present

## 2021-04-01 DIAGNOSIS — R072 Precordial pain: Secondary | ICD-10-CM

## 2021-04-01 MED ORDER — METOPROLOL TARTRATE 100 MG PO TABS: 100.0000 mg | ORAL_TABLET | Freq: Once | ORAL | 0 refills | Status: DC

## 2021-04-01 NOTE — Patient Instructions (Signed)
Medication Instructions:  No changes today *If you need a refill on your cardiac medications before your next appointment, please call your pharmacy*   Lab Work: None ordered   Testing/Procedures: Your physician has requested that you have an echocardiogram. Echocardiography is a painless test that uses sound waves to create images of your heart. It provides your doctor with information about the size and shape of your heart and how well your hearts chambers and valves are working. This procedure takes approximately one hour. There are no restrictions for this procedure.  Cardiac CTA - see instructions below.   Follow-Up: As  needed :1}    Other Instructions   Your cardiac CT will be scheduled at one of the below locations:   [facility] [address] [place], [place] [address] [phone]  Please arrive at the [facility] main entrance (entrance A) of [facility] 30 minutes prior to test start time. You can use the FREE valet parking offered at the main entrance (encouraged to control the heart rate for the test) Proceed to the [facility] Radiology Department (first floor) to check-in and test prep.  Please follow these instructions carefully (unless otherwise directed):   On the Night Before the Test: Be sure to Drink plenty of water. Do not consume any caffeinated/decaffeinated beverages or chocolate 12 hours prior to your test. Do not take any antihistamines 12 hours prior to your test.  On the Day of the Test: Drink plenty of water until 1 hour prior to the test. Do not eat any food 4 hours prior to the test. You may take your regular medications prior to the test.  Take metoprolol (Lopressor) two hours prior to test. FEMALES- please wear underwire-free bra if available, avoid dresses & tight clothing  After the Test: Drink plenty of water. After receiving IV contrast, you may experience a mild flushed feeling. This is normal. On  occasion, you may experience a mild rash up to 24 hours after the test. This is not dangerous. If this occurs, you can take Benadryl 25 mg and increase your fluid intake. If you experience trouble breathing, this can be serious. If it is severe call 911 IMMEDIATELY. If it is mild, please call our office. If you take any of these medications: Glipizide/Metformin, Avandament, Glucavance, please do not take 48 hours after completing test unless otherwise instructed.  We will call to schedule your test 2-4 weeks out understanding that some insurance companies will need an authorization prior to the service being performed.   For non-scheduling related questions, please contact the cardiac imaging nurse navigator should you have any questions/concerns: Marchia Bond, Cardiac Imaging Nurse Navigator Gordy Clement, Cardiac Imaging Nurse Navigator [facility] Direct Office Dial: [phone]   For scheduling needs, including cancellations and rescheduling, please call [place], [phone].

## 2021-04-01 NOTE — Progress Notes (Signed)
°Cardiology Office Note:   ° °Date:  04/01/2021  ° °ID:  Angela Ramsey, DOB 07/29/1965, MRN [id number] ° °PCP:  Abonza, Maritza, PA-C  ° °[facility] °Cardiologist:  Thukkani, Arun, MD °Referring MD: Abonza, Maritza, PA-C  ° °Chief Complaint/Reason for Referral: Chest pain ° °ASSESSMENT:   ° °Precordial pain - Plan: ECHOCARDIOGRAM COMPLETE ° °Lightheadedness ° ° °PLAN:   ° °In order of problems listed above: ° °1.  Angela Ramsey has developed a chest pain syndrome with some atypical features.  We will obtain a coronary CTA and echocardiogram to evaluate further.  If Angela Ramsey has mild obstructive coronary artery disease, they will require a statin (with goal LDL < 70) and aspirin, if they have high-grade disease we will need to consider optimal medical therapy and if symptoms are refractory to medical therapy, then a cardiac catheterization with possible PCI will be pursued to alleviate symptoms.  If they have high risk disease we will proceed directly to cardiac catheterization.  We will keep follow-up with me open-ended depending on Angela results of this testing.   ° °2.  Encourage p.o. fluid intake and if very symptomatic could consider midodrine.  Follow-up echocardiogram. °  ° °Dispo:  No follow-ups on file.  °  ° °Medication Adjustments/Labs and Tests Ordered: °Current medicines are reviewed at length with Angela Ramsey today.  Concerns regarding medicines are outlined above.  ° °Tests Ordered: °Orders Placed This Encounter  °Procedures  ° ECHOCARDIOGRAM COMPLETE  ° ° °Medication Changes: °No orders of Angela defined types were placed in this encounter. ° ° °History of Present Illness:   ° °FOCUSED PROBLEM LIST:   °1.  Temporal arteritis °2.  Hyperlipidemia ° °Angela Ramsey is a 56 y.o. female with Angela indicated medical history here for emergency room follow-up due to chest pain.  Ramsey presented last week with chest pain.  Angela Ramsey tells me that Angela Ramsey had a URI that lasted about 6 or 7 weeks over Angela  holidays.  Angela Ramsey was coughing quite a bit.  Angela Ramsey noticed that Angela chest pain happened after Angela Ramsey was coughing.  Angela Ramsey denies any significant shortness of breath.  Angela Ramsey has been lightheaded at times.  Angela Ramsey denies any paroxysmal nocturnal dyspnea or orthopnea. ° °In Angela emergency department Angela Ramsey troponins were negative and Angela Ramsey EKG was unremarkable.  Angela Ramsey chest x-ray was also free of acute cardiopulmonary disease.  Angela Ramsey was eventually discharged home.  Angela Ramsey saw Angela Ramsey primary care provider yesterday and orthostatics were tested and were positive. 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  ° Cholecalciferol (VITAMIN D) 50 MCG (2000 UT) tablet Take 1 tablet (2,000 Units total) by mouth daily.  ° FLUoxetine (PROZAC) 40 MG capsule TAKE 1 CAPSULE BY MOUTH EVERY DAY  ° Multiple Vitamins-Minerals (ALIVE MULTI-VITAMIN) CHEW Chew by mouth.  ° Omega-3 Fatty Acids (FISH OIL PO) Take 1 capsule by mouth daily. Unsure of dosage  ° topiramate (TOPAMAX) 25 MG tablet Start Topamax 25 mg (1 pill) at bedtime for one week, then increase to 50 mg (2 pills)   at bedtime for one week, then increase to 75 mg (3 pills) at bedtime for one week, then increase to 100 mg (4 pills) at bedtime  ° triamcinolone (KENALOG) 0.025 % ointment Apply 1 application topically at bedtime. (Ramsey taking differently: Apply 1 application topically as needed.)  °  ° °Allergies:    °Ramsey has no known allergies.  ° °Social History:   °Social History  ° °Tobacco Use  ° Smoking status: Never  ° Smokeless tobacco: Never  °Vaping Use  ° Vaping Use: Never used  °Substance Use Topics  ° Alcohol use: Yes  °  Comment:  1 every other week or less  ° Drug use: No  °  ° °Family Hx: °Family History  °Problem Relation Age of Onset  ° Hypertension Mother   ° Hypertension Father   ° Hyperlipidemia Father   ° Stroke Father   ° Thyroid cancer Sister   °     dx in Angela Ramsey early to mid 40s  ° Cancer Sister   °     thyroid  ° Breast cancer Maternal Aunt 67  °     mother's identical twin  ° Cancer Maternal Aunt   °     breast  ° Breast cancer Maternal Aunt 49  °     second cancer at  66  ° Cancer Maternal Aunt   °     breast x 2  ° Breast cancer Paternal Aunt 86  ° Breast cancer Maternal Grandmother   °     dx in Angela Ramsey late 60s to early 70s  ° Cancer Maternal Grandmother   °     breast  ° Cancer Maternal Grandfather   °     lung  ° Diabetes Paternal Grandmother   ° Leukemia Paternal Grandfather   ° Cancer Paternal Grandfather   °     leukemia  ° Colon cancer Neg Hx   ° Esophageal cancer Neg Hx   ° Rectal cancer Neg Hx   ° Stomach cancer Neg Hx   °  ° °Review of Systems:   °Please see Angela history of present illness.    °All other systems reviewed and are negative. °  ° ° °EKGs/Labs/Other Test Reviewed:   ° °EKG: Sinus rhythm ° °Prior CV studies: ° °Exercise echo stress test 2017 °- LV size was normal.  °- LV global systolic function was vigorous.  °- No evidence for new LV regional wall motion abnormalities.  ° °Imaging studies that I have independently reviewed today: Chest x-ray ° °Recent Labs: °01/03/2021: ALT 18; TSH 1.830 °03/24/2021: BUN 18; Creatinine, Ser 0.88; Hemoglobin 13.5; Platelets 325; Potassium 3.5; Sodium 136  ° °Recent Lipid Panel °Lab Results  °Component Value Date/Time  ° CHOL 259 (H) 01/03/2021 11:15 AM  ° TRIG 118 01/03/2021 11:15 AM  ° HDL 65 01/03/2021 11:15 AM  ° LDLCALC 173 (H) 01/03/2021 11:15 AM  ° LDLCALC 142 (H) 12/30/2017 11:20 AM  ° ° °Risk Assessment/Calculations:   ° ° °    ° °Physical Exam:   ° °VS:  BP 108/70    Pulse 78    Ht 4' 11" (1.499 m)    Wt 203 lb 9.6 oz (92.4 kg)    LMP 12/25/2015 (Exact Date)    SpO2 98%     BMI 41.12 kg/m²    °Wt Readings from Last 3 Encounters:  °04/01/21 203 lb 9.6 oz (92.4 kg)  °03/31/21 205 lb (93 kg)  °03/24/21 199 lb (90.3   kg)  °  °GENERAL:  No apparent distress, AOx3 °HEENT:  No carotid bruits, +2 carotid impulses, no scleral icterus °CAR: RRR no murmurs, gallops, rubs, or thrills °RES:  Clear to auscultation bilaterally °ABD:  Soft, nontender, nondistended, positive bowel sounds x 4 °VASC:  +2 radial pulses, +2 carotid pulses, palpable pedal pulses °NEURO:  CN 2-12 grossly intact; motor and sensory grossly intact °PSYCH:  No active depression or anxiety °EXT:  No edema, ecchymosis, or cyanosis ° °Signed, °Arun K Thukkani, MD  °04/01/2021 8:24 AM    °[facility] °[address], [place], [place]  [address] °Phone: [phone]; Fax: [phone]  ° °Note:  This document was prepared using Dragon voice recognition software and may include unintentional dictation errors. °

## 2021-04-03 ENCOUNTER — Encounter: Payer: Self-pay | Admitting: Physician Assistant

## 2021-04-03 LAB — COMPREHENSIVE METABOLIC PANEL
ALT: 16 IU/L
AST: 17 IU/L (ref 0–40)
Albumin/Globulin Ratio: 1.8 (ref 1.2–2.2)
Albumin: 4.5 g/dL (ref 3.8–4.9)
Alkaline Phosphatase: 127 IU/L — ABNORMAL HIGH (ref 44–121)
BUN/Creatinine Ratio: 21
BUN: 15 mg/dL
Bilirubin Total: 0.3 mg/dL (ref 0.0–1.2)
CO2: 25 mmol/L (ref 20–29)
Calcium: 9.5 mg/dL
Chloride: 105 mmol/L (ref 96–106)
Creatinine, Ser: 0.71 mg/dL
Globulin, Total: 2.5 g/dL (ref 1.5–4.5)
Glucose: 98 mg/dL (ref 70–99)
Potassium: 4.6 mmol/L (ref 3.5–5.2)
Sodium: 142 mmol/L (ref 134–144)
Total Protein: 7 g/dL (ref 6.0–8.5)
eGFR: 102 mL/min/{1.73_m2} (ref >59)

## 2021-04-03 LAB — TSH: TSH: 1.58 u[IU]/mL (ref 0.450–4.500)

## 2021-04-07 ENCOUNTER — Ambulatory Visit (INDEPENDENT_AMBULATORY_CARE_PROVIDER_SITE_OTHER): Payer: 59 | Admitting: Psychiatry

## 2021-04-07 ENCOUNTER — Other Ambulatory Visit: Payer: Self-pay

## 2021-04-07 VITALS — BP 130/80 | HR 61 | Ht 60.0 in | Wt 205.0 lb

## 2021-04-07 DIAGNOSIS — G43009 Migraine without aura, not intractable, without status migrainosus: Secondary | ICD-10-CM

## 2021-04-07 MED ORDER — CYCLOBENZAPRINE HCL 5 MG PO TABS: 5.0000 mg | ORAL_TABLET | Freq: Every evening | ORAL | 3 refills | Status: DC | PRN

## 2021-04-07 NOTE — Patient Instructions (Addendum)
Try taking Topamax 75 mg (3 pills) at bedtime. If no difference in dizziness can increase back up to 100 mg Take Flexeril as needed at bedtime for jaw pain Physical therapy when able Try compression stockings to help with lightheadedness

## 2021-04-07 NOTE — Progress Notes (Signed)
° °  CC:  headaches  Follow-up Visit  Last visit: 01/01/21  Brief HPI: 56 year old female with a history of depression and anxiety who follows in clinic for chronic headaches and jaw pain.  At her last visit MRI brain was ordered. She was started on Topamax for headache prevention.  Interval History: Headaches resolved with Topamax. She did not notice any side effects while starting it. She continues to have jaw pain. Was not able to attend PT due to a bad URI  7 weeks ago.  Notes she has been very lightheaded for the past 2 weeks. Was told she had orthostatic hypotension by her PCP. She has been trying to stay hydrated.  MRI brain 01/14/21 was unremarkable (reviewed personally 04/07/21).  Headache days per month: 0 Headache free days per month: 30  Current Headache Regimen: Preventative: Topamax 100 mg QHS  Prior Therapies                                  Gabapentin - constipation Topamax 100 mg QHS  Physical Exam:   Vital Signs: BP 130/80    Pulse 61    Ht 5' (1.524 m)    Wt 205 lb (93 kg)    LMP 12/25/2015 (Exact Date)    BMI 40.04 kg/m  GENERAL:  well appearing, in no acute distress, alert  SKIN:  Color, texture, turgor normal. No rashes or lesions HEAD:  Normocephalic/atraumatic. RESP: normal respiratory effort MSK:  No gross joint deformities.   NEUROLOGICAL: Mental Status: Alert, oriented to person, place and time, Follows commands, and Speech fluent and appropriate. Cranial Nerves: PERRL, face symmetric, no dysarthria, hearing grossly intact Motor: moves all extremities equally Gait: normal-based.  IMPRESSION: 57 year old female with a history of depression and anxiety who presents for follow up of headaches and jaw pain. MRI brain was normal. Headaches have resolved on Topamax. She does report new lightheadedness, though she had been on Topamax for 2 months prior to this without symptoms. She will try decreasing Topamax to 75 mg QHS and see if this improves her  lightheadedness at all. She plans to start PT once she has completed her cardiology workup for lightheadedness. Will start Flexeril as needed for her jaw pain in the meantime.   PLAN: -Prevention: Decrease Topamax to 75 mg QHS to see if this reduces dizziness -Rescue: Flexeril 5 mg QHS PRN for jaw pain/muscle spasms -Counseled on maintaining good hydration and trying compression stockings for orthostatic hypotension   Follow-up: 1 year or sooner if needed  I spent a total of 27 minutes on the date of the service. Headache education was done. Discussed lifestyle modification including increased oral hydration. Discussed treatment options including preventive and acute medications. Discussed medication side effects, adverse reactions and drug interactions. Written educational materials and patient instructions outlining all of the above were given.  Genia Harold, MD 04/07/21 2:04 PM

## 2021-04-11 ENCOUNTER — Other Ambulatory Visit: Payer: Self-pay

## 2021-04-11 ENCOUNTER — Telehealth (HOSPITAL_COMMUNITY): Payer: Self-pay | Admitting: *Deleted

## 2021-04-11 ENCOUNTER — Ambulatory Visit (HOSPITAL_COMMUNITY): Payer: 59 | Attending: Cardiology

## 2021-04-11 DIAGNOSIS — R072 Precordial pain: Secondary | ICD-10-CM | POA: Insufficient documentation

## 2021-04-11 LAB — ECHOCARDIOGRAM COMPLETE
Area-P 1/2: 3.24 cm2
S' Lateral: 3.1 cm

## 2021-04-11 NOTE — Telephone Encounter (Signed)
Attempted to call patient regarding upcoming cardiac CT appointment. °Left message on voicemail with name and callback number ° °Kattaleya Alia RN Navigator Cardiac Imaging °[facility] °[phone] Office °[phone] Cell ° °

## 2021-04-11 NOTE — Telephone Encounter (Signed)
Patient returning call regarding upcoming cardiac imaging study; pt verbalizes understanding of appt date/time, parking situation and where to check in, pre-test NPO status and medications ordered, and verified current allergies; name and call back number provided for further questions should they arise  Gordy Clement RN Navigator Cardiac Imaging [facility] [phone] office [phone] cell  Patient to take 100mg  metoprolol tartrate two hours prior to her cardiac CT scan.  She is aware to arrive at 2:45pm for her 3:15pm scan.

## 2021-04-14 ENCOUNTER — Ambulatory Visit (HOSPITAL_COMMUNITY)
Admission: RE | Admit: 2021-04-14 | Discharge: 2021-04-14 | Disposition: A | Discharge status: Home / Self Care | Payer: 59 | Source: Ambulatory Visit | Attending: Internal Medicine | Admitting: Internal Medicine

## 2021-04-14 ENCOUNTER — Other Ambulatory Visit: Payer: Self-pay

## 2021-04-14 ENCOUNTER — Encounter (HOSPITAL_COMMUNITY): Payer: Self-pay

## 2021-04-14 DIAGNOSIS — R072 Precordial pain: Secondary | ICD-10-CM

## 2021-04-14 MED ORDER — NITROGLYCERIN 0.4 MG SL SUBL
0.8000 mg | SUBLINGUAL_TABLET | Freq: Once | SUBLINGUAL | Status: AC
Start: 2021-04-14 — End: 2021-04-14
  Administered 2021-04-14: 0.8 mg via SUBLINGUAL

## 2021-04-14 MED ORDER — IOHEXOL 350 MG/ML SOLN
95.0000 mL | Freq: Once | INTRAVENOUS | Status: AC | PRN
  Administered 2021-04-14: 95 mL via INTRAVENOUS

## 2021-04-14 MED ORDER — NITROGLYCERIN 0.4 MG SL SUBL
SUBLINGUAL_TABLET | SUBLINGUAL | Status: AC
  Filled 2021-04-14: qty 2

## 2021-04-18 ENCOUNTER — Other Ambulatory Visit: Payer: Self-pay | Admitting: Obstetrics & Gynecology

## 2021-05-13 ENCOUNTER — Ambulatory Visit: Payer: 59 | Attending: Psychiatry | Admitting: Physical Therapy

## 2021-05-13 ENCOUNTER — Other Ambulatory Visit: Payer: Self-pay

## 2021-05-13 ENCOUNTER — Encounter: Payer: Self-pay | Admitting: Physical Therapy

## 2021-05-13 DIAGNOSIS — R6884 Jaw pain: Secondary | ICD-10-CM | POA: Diagnosis not present

## 2021-05-13 DIAGNOSIS — M6281 Muscle weakness (generalized): Secondary | ICD-10-CM | POA: Diagnosis present

## 2021-05-13 DIAGNOSIS — M542 Cervicalgia: Secondary | ICD-10-CM | POA: Diagnosis present

## 2021-05-13 NOTE — Therapy (Signed)
?OUTPATIENT PHYSICAL THERAPY CERVICAL EVALUATION ? ? ?Patient Name: Angela Ramsey ?MRN: [id number] ?DOB:Dec 30, 1965, 56 y.o., female ?Today's Date: 05/13/2021 ? ? PT End of Session - 05/13/21 1339   ? ? Visit Number 1   ? Number of Visits 12   ? Date for PT Re-Evaluation 06/24/21   ? Authorization Type UHC   ? PT Start Time 1330   ? PT Stop Time 1418   ? PT Time Calculation (min) 48 min   ? Activity Tolerance Patient tolerated treatment well   ? Behavior During Therapy [facility] for tasks assessed/performed   ? ?  ?  ? ?  ? ? ?Past Medical History:  ?Diagnosis Date  ? Anxiety   ? Endometriosis   ? Headache   ? Heart murmur   ? as a child  ? 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 ATM gene called [id number]  mutation of unknown significance see genetic counseling note 09/21/2014  ? ?Past Surgical History:  ?Procedure Laterality Date  ? BREAST BIOPSY Left 2016  ? BREAST SURGERY    ? Breast Bx benign  ? FOOT SURGERY Left   ? plantar fasciitis  ? NOVASURE ABLATION  10/2004  ? PELVIC LAPAROSCOPY  12/2002  ? endometriosis  ? ?Patient Active Problem List  ? Diagnosis Date Noted  ? Temporal headache 12/02/2020  ? Jaw pain 12/02/2020  ? Vitamin D insufficiency 05/02/2019  ? Insomnia 05/02/2019  ? obesity ([place]) 05/02/2019  ? Eczema 05/02/2019  ? Unspecified menopausal and perimenopausal disorder 05/18/2018  ? Hyperlipidemia 05/18/2018  ? mutation of ATM gene 04/26/2018  ? Strong Family history of breast cancer in females 04/26/2018  ? Genital herpes 02/24/2016  ? Hemorrhoids- small external nonthrombosed hemorrhoid perirectal area at 10 PM 02/24/2016  ? External hemorrhoids without complication 93/81/8299  ? Screening for multiple conditions 12/31/2015  ? Leukocytosis 12/31/2015  ? Environmental and seasonal allergies 12/01/2015  ? BMI 37.0-37.9, adult 10/16/2015  ? GAD (generalized anxiety disorder) 10/16/2015  ? Family history of early CAD 10/16/2015  ? Nonspecific chest pain 10/16/2015  ? Genetic testing 09/21/2014  ?  Family history of breast cancer   ? Family history of thyroid cancer   ? h/o ovarian Endometriosis   ? ? ?PCP: Lorrene Reid, PA-C ? ?REFERRING PROVIDER: Genia Harold, MD ? ?REFERRING DIAG:  ?M54.2 (ICD-10-CM) - Cervicalgia  ?R68.84 (ICD-10-CM) - Jaw pain  ? ? ?THERAPY DIAG:  ?Cervicalgia - Plan: PT plan of care cert/re-cert ? ?Muscle weakness (generalized) - Plan: PT plan of care cert/re-cert ? ?ONSET DATE: April 2022 ? ?SUBJECTIVE:                                                                                                                                                                                                        ? ?  SUBJECTIVE STATEMENT: ?I started having this pain in my R jaw and neck about a year a go. My dentist check for TMJ and I dont have that so he sent me on to my medical doctor.  I started having pain with no incident.  I sometimes have headaches with the R jaw pain and neck pain. I was having daily headaches and Dr Billey Gosling has me on meds that help. I still have jaw pain R and neck pain on bil upper trap  R>L. I often have temple and ear pain on the right. I have pain when I eat and chew ? ?PERTINENT HISTORY:  ?Foot surgery, anxiety, endometriosis, headache heart murmur, monoallelic mutation of ATM gene, orthostatic hypotension ? ? ?PAIN:  ?Are you having pain? Yes: NPRS scale: 2 /10 and at worst 7/10 ?Pain location: R jaw and bil neck ?Pain description: throbbing, sharp especially yawning is sharp ?Aggravating factors: chewing, singing, talking , yawning at worst , any neck movement ?Relieving factors: when I dont move my jaw ? ?PRECAUTIONS: None ? ?WEIGHT BEARING RESTRICTIONS No ? ?FALLS:  ?Has patient fallen in last 6 months? No ? ?LIVING ENVIRONMENT: ?Lives with: lives with their spouse ?Lives in: House/apartment ?Stairs: Yes: Internal: 10 steps; on left going up and External: 5 steps; none ?Has following equipment at home: None ? ?OCCUPATION: Homemaker ? ?PLOF: Independent ? ?PATIENT  GOALS I really want to get rid of my jaw pain and neck pain ? ?OBJECTIVE:  ? ?DIAGNOSTIC FINDINGS:  ?01-14-22 Unremarkable MRI brain (with and without).  Few scattered nonspecific foci of T2 hyperintensities / gliosis. No acute findings. ? ?PATIENT SURVEYS:  ?FOTO 58 eval predicted 65 ? ? ?COGNITION: ?Overall cognitive status: Within functional limits for tasks assessed ? ? ?SENSATION: ?WFL  except on face sometimes feels like it goes numb on face only ? ?POSTURE:  ?Obese, forward head and rounded , shoulder  anterior pelvic ?Pt with tightened R jaw compared to Left jaw, visibly shortened jaw line on R ?Jaw protraction veers to R  ?Jaw opening 2.4 cm max  Left 4.5 cm from temporal notch to mandibular notch   5.4 cm on R ?C sign to the R  ?Pt with clicking on opening and protracting jaw ? ?PALPATION: ?TTP on R jaw over masseter , inside jaw marked TTP over pterygoid R , bil tight upper traps  ? ?CERVICAL ROM:  ? ?Active ROM A/PROM (deg) ?05/13/2021  ?Flexion 49  ?Extension 41  ?Right lateral flexion 27 pain  ?Left lateral flexion 20  ?Right rotation 44 ?pain more than left  ?Left rotation 40  ? (Blank rows = not tested) ? ?UE ROM:  WNL bil UE ? ?Active ROM Right ?05/13/2021 Left ?05/13/2021  ?Shoulder flexion    ?Shoulder extension    ?Shoulder abduction    ?Shoulder adduction    ?Shoulder extension    ?Shoulder internal rotation    ?Shoulder external rotation    ?Elbow flexion    ?Elbow extension    ?Wrist flexion    ?Wrist extension    ?Wrist ulnar deviation    ?Wrist radial deviation    ?Wrist pronation    ?Wrist supination    ? (Blank rows = not tested) ? ?UE MMT:   ? ?MMT Right ?05/13/2021 Left ?05/13/2021  ?Shoulder flexion 4+ 4+  ?Shoulder extension    ?Shoulder abduction 4 4  ?Shoulder adduction    ?Shoulder extension    ?Shoulder internal rotation 4+ 4+  ?Shoulder external rotation 4 4  ?  Middle trapezius    ?Lower trapezius    ?Elbow flexion    ?Elbow extension    ?Wrist flexion    ?Wrist extension    ?Wrist  ulnar deviation    ?Wrist radial deviation    ?Wrist pronation    ?Wrist supination    ?Grip strength 44 35.33  ? (Blank rows = not tested) ? ?CERVICAL SPECIAL TESTS:  ?Neck flexor muscle endurance test: Positive, Spurling's test: Negative, and Distraction test: Negative ?Pt holds supine  neck flexion 1 in from mat for 10 sec  35 sec normal ? ?FUNCTIONAL TESTS:  ?5 times sit to stand: 11.12 sec ? ?PATIENT SURVEYS:  ?FOTO 58 %  predicted 65% ? ?TODAY'S TREATMENT:  ?Manual- STW to Right masseter and Upper trap, STW to R pterygoid marked tenderness ?Trigger Point Dry Needling Treatment: ?Pre-treatment instruction: Patient instructed on dry needling rationale, procedures, and possible side effects including pain during treatment (achy,cramping feeling), bruising, drop of blood, lightheadedness, nausea, sweating. ?Patient Consent Given: Yes ?Education handout provided: Yes ?Muscles treated: R upper trap and R masseter  ?Needle size and number:  .25 x 30 mm x 3, .30 x 50 R upper trap ?Electrical stimulation performed: No ?Parameters: N/A ?Treatment response/outcome: Twitch response elicited and Palpable decrease in muscle tension ?Post-treatment instructions: Patient instructed to expect possible mild to moderate muscle soreness later today and/or tomorrow. Patient instructed in methods to reduce muscle soreness and to continue prescribed HEP. If patient was dry needled over the lung field, patient was instructed on signs and symptoms of pneumothorax and, however unlikely, to see immediate medical attention should they occur. Patient was also educated on signs and symptoms of infection and to seek medical attention should they occur. Patient verbalized understanding of these instructions and education.  ? ? ?PATIENT EDUCATION:  ?Education details: POC  Explanation of findings.  FOTO report, initial HEP and TPDN education, intial posture ?Person educated: Patient ?Education method: Explanation, Demonstration, Tactile cues,  Verbal cues, and Handouts ?Education comprehension: verbalized understanding, returned demonstration, verbal cues required, tactile cues required, and needs further education ? ? ?HOME EXERCISE PROGRAM: ?A

## 2021-05-13 NOTE — Patient Instructions (Addendum)
?Trigger Point Dry Needling ? ?What is Trigger Point Dry Needling (DN)? ?DN is a physical therapy technique used to treat muscle pain and dysfunction. Specifically, DN helps deactivate muscle trigger points (muscle knots).  ?A thin filiform needle is used to penetrate the skin and stimulate the underlying trigger point. The goal is for a local twitch response (LTR) to occur and for the trigger point to relax. No medication of any kind is injected during the procedure.  ? ?What Does Trigger Point Dry Needling Feel Like?  ?The procedure feels different for each individual patient. Some patients report that they do not actually feel the needle enter the skin and overall the process is not painful. Very mild bleeding may occur. However, many patients feel a deep cramping in the muscle in which the needle was inserted. This is the local twitch response.  ? ?How Will I feel after the treatment? ?Soreness is normal, and the onset of soreness may not occur for a few hours. Typically this soreness does not last longer than two days.  ?Bruising is uncommon, however; ice can be used to decrease any possible bruising.  ?In rare cases feeling tired or nauseous after the treatment is normal. In addition, your symptoms may get worse before they get better, this period will typically not last longer than 24 hours.  ? ?What Can I do After My Treatment? ?Increase your hydration by drinking more water for the next 24 hours. ?You may place ice or heat on the areas treated that have become sore, however, do not use heat on inflamed or bruised areas. Heat often brings more relief post needling. ?You can continue your regular activities, but vigorous activity is not recommended initially after the treatment for 24 hours. ?DN is best combined with other physical therapy such as strengthening, stretching, and other therapies.   ?Posture Tips ?DO: - stand tall and erect - keep chin tucked in - keep head and shoulders in alignment - check  posture regularly in mirror or large window - pull head back against headrest in car seat;  Change your position often.  Sit with lumbar support. ?DON'T: - slouch or slump while watching TV or reading - sit, stand or lie in one position  for too long;  Sitting is especially hard on the spine so if you sit at a desk/use the computer, then stand up often! ? ? ?Copyright ? VHI. All rights reserved.  ?Posture - Standing ? ? ?Good posture is important. Avoid slouching and forward head thrust. Maintain curve in low back and align ears over shoul- ders, hips over ankles.  Pull your belly button in toward your back bone.  Stand with even weight in toes and ribs lifted up and chin down ? ? ?Copyright ? VHI. All rights reserved.  ?Posture - Sitting ? ? ?Sit upright, head facing forward. Try using a roll to support lower back. Keep shoulders relaxed, and avoid rounded back. Keep hips level with knees. Avoid crossing legs for long periods. Do not cross legs and sit on sit bone and not tail bone ? ? ?Copyright ? VHI. All rights reserved.   ?Access Code: [email or web address] ?URL: [email or web address] ?Date: 05/13/2021 ?Prepared by: Voncille Lo ? ?Exercises ?- External Jaw Release  - 1 x daily - 7 x weekly - 3 sets - 10 reps ?- Jaw Protraction  - 1 x daily - 7 x weekly - 3 sets - 10 reps ?- Supine Deep Neck Flexor Training  - 2-3 x daily -  7 x weekly - 10 reps - 3 hold ?- Seated Cervical Retraction Protraction AROM  - 2-3 x daily - 7 x weekly - 1 sets - 10 reps ?- Seated Gentle Upper Trapezius Stretch  - 1 x daily - 7 x weekly - 1 sets - 3 reps - 30 hold ?- Gentle Levator Scapulae Stretch  - 1 x daily - 7 x weekly - 3 sets - 3 reps - 30 hold ? ?Voncille Lo, PT, Dent ?Certified Exercise Expert for the Aging Adult  ?05/13/21 2:14 PM ?Phone: [phone] ?Fax: [phone]  ?

## 2021-05-20 ENCOUNTER — Other Ambulatory Visit: Payer: Self-pay | Admitting: Obstetrics & Gynecology

## 2021-05-21 ENCOUNTER — Encounter: Payer: Self-pay | Admitting: Physical Therapy

## 2021-05-21 ENCOUNTER — Ambulatory Visit: Payer: 59 | Attending: Psychiatry | Admitting: Physical Therapy

## 2021-05-21 DIAGNOSIS — M6281 Muscle weakness (generalized): Secondary | ICD-10-CM | POA: Diagnosis present

## 2021-05-21 DIAGNOSIS — M542 Cervicalgia: Secondary | ICD-10-CM | POA: Insufficient documentation

## 2021-05-21 NOTE — Therapy (Signed)
?OUTPATIENT PHYSICAL THERAPY TREATMENT NOTE ? ? ?Patient Name: Angela Ramsey ?MRN: [id number] ?DOB:May 08, 1965, 56 y.o., female ?Today's Date: 05/21/2021 ? ?PCP: Lorrene Reid, PA-C ?REFERRING PROVIDER: Genia Harold, MD ? ?END OF SESSION:  ? PT End of Session - 05/21/21 1011   ? ? Visit Number 2   ? Number of Visits 12   ? Date for PT Re-Evaluation 06/24/21   ? Authorization Type UHC   ? PT Start Time 1013   ? PT Stop Time 1100   ? PT Time Calculation (min) 47 min   ? Activity Tolerance Patient tolerated treatment well   ? ?  ?  ? ?  ? ? ?Past Medical History:  ?Diagnosis Date  ? Anxiety   ? Endometriosis   ? Headache   ? Heart murmur   ? as a child  ? 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 ATM gene called [id number]  mutation of unknown significance see genetic counseling note 09/21/2014  ? ?Past Surgical History:  ?Procedure Laterality Date  ? BREAST BIOPSY Left 2016  ? BREAST SURGERY    ? Breast Bx benign  ? FOOT SURGERY Left   ? plantar fasciitis  ? NOVASURE ABLATION  10/2004  ? PELVIC LAPAROSCOPY  12/2002  ? endometriosis  ? ?Patient Active Problem List  ? Diagnosis Date Noted  ? Temporal headache 12/02/2020  ? Jaw pain 12/02/2020  ? Vitamin D insufficiency 05/02/2019  ? Insomnia 05/02/2019  ? obesity ([place]) 05/02/2019  ? Eczema 05/02/2019  ? Unspecified menopausal and perimenopausal disorder 05/18/2018  ? Hyperlipidemia 05/18/2018  ? mutation of ATM gene 04/26/2018  ? Strong Family history of breast cancer in females 04/26/2018  ? Genital herpes 02/24/2016  ? Hemorrhoids- small external nonthrombosed hemorrhoid perirectal area at 10 PM 02/24/2016  ? External hemorrhoids without complication 70/02/7492  ? Screening for multiple conditions 12/31/2015  ? Leukocytosis 12/31/2015  ? Environmental and seasonal allergies 12/01/2015  ? BMI 37.0-37.9, adult 10/16/2015  ? GAD (generalized anxiety disorder) 10/16/2015  ? Family history of early CAD 10/16/2015  ? Nonspecific chest pain 10/16/2015  ? Genetic  testing 09/21/2014  ? Family history of breast cancer   ? Family history of thyroid cancer   ? h/o ovarian Endometriosis   ? ? ?REFERRING DIAG:  ?M54.2 (ICD-10-CM) - Cervicalgia  ?R68.84 (ICD-10-CM) - Jaw pain  ? ? ?THERAPY DIAG:  ?Cervicalgia ? ?Muscle weakness (generalized) ? ?PERTINENT HISTORY: Foot surgery, anxiety, endometriosis, headache heart murmur, monoallelic mutation of ATM gene, orthostatic hypotension ? ?PRECAUTIONS: N/A ? ?SUBJECTIVE: "I've been doing the exercises and it did help after the last session with the DN, it felt looser." ? ?PAIN:  ?Are you having pain? Yes: NPRS scale: 2/10 ?Pain location: R TMJ ?Pain description: sore/ ?Aggravating factors: opening/ closing the mouth ?Relieving factors: exercse,  ? ? ? ? ? ?OBJECTIVE:  ?*Unless otherwise noted by date, all objective measures were captured on initial evaluation.  ?  ?DIAGNOSTIC FINDINGS:  ?01-14-22 Unremarkable MRI brain (with and without).  Few scattered nonspecific foci of T2 hyperintensities / gliosis. No acute findings. ?  ?PATIENT SURVEYS:  ?FOTO 58 eval predicted 65 ?  ?  ?COGNITION: ?Overall cognitive status: Within functional limits for tasks assessed ?  ?  ?SENSATION: ?WFL  except on face sometimes feels like it goes numb on face only ?  ?POSTURE:  ?Obese, forward head and rounded , shoulder  anterior pelvic ?Pt with tightened R jaw compared to Left jaw, visibly shortened jaw line on  R ?Jaw protraction veers to R  ?Jaw opening 2.4 cm max  Left 4.5 cm from temporal notch to mandibular notch   5.4 cm on R ?C sign to the R  ?Pt with clicking on opening and protracting jaw ?  ?PALPATION: ?TTP on R jaw over masseter , inside jaw marked TTP over pterygoid R , bil tight upper traps  ?  ?CERVICAL ROM:  ?  ?Active ROM A/PROM (deg) ?05/13/2021  ?Flexion 49  ?Extension 41  ?Right lateral flexion 27 pain  ?Left lateral flexion 20  ?Right rotation 44 ?pain more than left  ?Left rotation 40  ? (Blank rows = not tested) ?  ?UE ROM:  WNL bil UE ?   ?Active ROM Right ?05/13/2021 Left ?05/13/2021  ?Shoulder flexion      ?Shoulder extension      ?Shoulder abduction      ?Shoulder adduction      ?Shoulder extension      ?Shoulder internal rotation      ?Shoulder external rotation      ?Elbow flexion      ?Elbow extension      ?Wrist flexion      ?Wrist extension      ?Wrist ulnar deviation      ?Wrist radial deviation      ?Wrist pronation      ?Wrist supination      ? (Blank rows = not tested) ?  ?UE MMT:   ?  ?MMT Right ?05/13/2021 Left ?05/13/2021  ?Shoulder flexion 4+ 4+  ?Shoulder extension      ?Shoulder abduction 4 4  ?Shoulder adduction      ?Shoulder extension      ?Shoulder internal rotation 4+ 4+  ?Shoulder external rotation 4 4  ?Middle trapezius      ?Lower trapezius      ?Elbow flexion      ?Elbow extension      ?Wrist flexion      ?Wrist extension      ?Wrist ulnar deviation      ?Wrist radial deviation      ?Wrist pronation      ?Wrist supination      ?Grip strength 44 35.33  ? (Blank rows = not tested) ?  ?CERVICAL SPECIAL TESTS:  ?Neck flexor muscle endurance test: Positive, Spurling's test: Negative, and Distraction test: Negative ?Pt holds supine  neck flexion 1 in from mat for 10 sec  35 sec normal ?  ?FUNCTIONAL TESTS:  ?5 times sit to stand: 11.12 sec ?  ?PATIENT SURVEYS:  ?FOTO 58 %  predicted 65% ?  ?TODAY'S TREATMENT:  ? ?[place] Adult PT Treatment:                                                DATE: 05/21/2021 ?Therapeutic Exercise: Cues to keep tongue in resting position behind front teeth.  ?Mandibular depression keeping tongue in resting position 1 x 10 ?UBE L5 x 4 min (FWD/BWD x 2 min ) ?R Upper trap stretch 1 x 30  ?R levator scapulae stretch 1 x 30 sec ?Lower trap activation seated with elbows propped on bolster 1 x 15 with RTB ?Double ER with scapular retraction 1 x 15 with RTB ?Supine chin tuck head lift 5 x 10 sec hold ?Manual Therapy: ?Trigger Point Dry-Needling  ?Treatment instructions: Expect mild to moderate muscle  soreness. S/S of  pneumothorax if dry needled over a lung field, and to seek immediate medical attention should they occur. Patient verbalized understanding of these instructions and education. ? ?Patient Consent Given: Yes ?Education handout provided: Yes ?Muscles treated: Masseter, upper trap, lateral pterygoid ?Electrical stimulation performed: No ?Parameters: N/A ?Treatment response/outcome: Twitch noted and reduction ?IASTM along the R upper trap, and along the R masseter ? ? ? ?Performed at evaluation ?Manual- STW to Right masseter and Upper trap, STW to R pterygoid marked tenderness ?Trigger Point Dry Needling Treatment: ?Pre-treatment instruction: Patient instructed on dry needling rationale, procedures, and possible side effects including pain during treatment (achy,cramping feeling), bruising, drop of blood, lightheadedness, nausea, sweating. ?Patient Consent Given: Yes ?Education handout provided: Yes ?Muscles treated: R upper trap and R masseter  ?Needle size and number:  .25 x 30 mm x 3, .30 x 50 R upper trap ?Electrical stimulation performed: No ?Parameters: N/A ?Treatment response/outcome: Twitch response elicited and Palpable decrease in muscle tension ?Post-treatment instructions: Patient instructed to expect possible mild to moderate muscle soreness later today and/or tomorrow. Patient instructed in methods to reduce muscle soreness and to continue prescribed HEP. If patient was dry needled over the lung field, patient was instructed on signs and symptoms of pneumothorax and, however unlikely, to see immediate medical attention should they occur. Patient was also educated on signs and symptoms of infection and to seek medical attention should they occur. Patient verbalized understanding of these instructions and education.  ?  ?  ?PATIENT EDUCATION:  ?Education details: POC  Explanation of findings.  FOTO report, initial HEP and TPDN education, intial posture ?Person educated: Patient ?Education method: Explanation,  Demonstration, Tactile cues, Verbal cues, and Handouts ?Education comprehension: verbalized understanding, returned demonstration, verbal cues required, tactile cues required, and needs further educatio

## 2021-05-22 ENCOUNTER — Other Ambulatory Visit: Payer: 59

## 2021-05-28 ENCOUNTER — Ambulatory Visit: Payer: 59 | Admitting: Physical Therapy

## 2021-05-28 DIAGNOSIS — M6281 Muscle weakness (generalized): Secondary | ICD-10-CM

## 2021-05-28 DIAGNOSIS — M542 Cervicalgia: Secondary | ICD-10-CM | POA: Diagnosis not present

## 2021-05-28 NOTE — Therapy (Signed)
?OUTPATIENT PHYSICAL THERAPY TREATMENT NOTE ? ? ?Patient Name: Angela Ramsey ?MRN: [id number] ?DOB:1965-03-22, 56 y.o., female ?Today's Date: 05/28/2021 ? ?PCP: Lorrene Reid, PA-C ?REFERRING PROVIDER: Genia Harold, MD ? ?END OF SESSION:  ? PT End of Session - 05/28/21 1018   ? ? Visit Number 3   ? Number of Visits 12   ? Date for PT Re-Evaluation 06/24/21   ? Authorization Type U[employer]   ? PT Start Time 1018   ? PT Stop Time 1100   ? PT Time Calculation (min) 42 min   ? ?  ?  ? ?  ? ? ? ?Past Medical History:  ?Diagnosis Date  ? Anxiety   ? Endometriosis   ? Headache   ? Heart murmur   ? as a child  ? 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 ATM gene called [id number]  mutation of unknown significance see genetic counseling note 09/21/2014  ? ?Past Surgical History:  ?Procedure Laterality Date  ? BREAST BIOPSY Left 2016  ? BREAST SURGERY    ? Breast Bx benign  ? FOOT SURGERY Left   ? plantar fasciitis  ? NOVASURE ABLATION  10/2004  ? PELVIC LAPAROSCOPY  12/2002  ? endometriosis  ? ?Patient Active Problem List  ? Diagnosis Date Noted  ? Temporal headache 12/02/2020  ? Jaw pain 12/02/2020  ? Vitamin D insufficiency 05/02/2019  ? Insomnia 05/02/2019  ? obesity ([place]) 05/02/2019  ? Eczema 05/02/2019  ? Unspecified menopausal and perimenopausal disorder 05/18/2018  ? Hyperlipidemia 05/18/2018  ? mutation of ATM gene 04/26/2018  ? Strong Family history of breast cancer in females 04/26/2018  ? Genital herpes 02/24/2016  ? Hemorrhoids- small external nonthrombosed hemorrhoid perirectal area at 10 PM 02/24/2016  ? External hemorrhoids without complication 40/98/1191  ? Screening for multiple conditions 12/31/2015  ? Leukocytosis 12/31/2015  ? Environmental and seasonal allergies 12/01/2015  ? BMI 37.0-37.9, adult 10/16/2015  ? GAD (generalized anxiety disorder) 10/16/2015  ? Family history of early CAD 10/16/2015  ? Nonspecific chest pain 10/16/2015  ? Genetic testing 09/21/2014  ? Family history of breast cancer    ? Family history of thyroid cancer   ? h/o ovarian Endometriosis   ? ? ?REFERRING DIAG:  ?M54.2 (ICD-10-CM) - Cervicalgia  ?R68.84 (ICD-10-CM) - Jaw pain  ? ? ?THERAPY DIAG:  ?Cervicalgia ? ?Muscle weakness (generalized) ? ?PERTINENT HISTORY: Foot surgery, anxiety, endometriosis, headache heart murmur, monoallelic mutation of ATM gene, orthostatic hypotension ? ?PRECAUTIONS: N/A ? ?SUBJECTIVE: " I am doing pretty good. The last session I it felt better for about 2 days." ? ?PAIN:  ?Are you having pain? Yes: NPRS scale: 0/10 ?Pain location: R TMJ ?Pain description: sore/ ?Aggravating factors: opening/ closing the mouth ?Relieving factors: exercse,  ? ? ? ? ? ?OBJECTIVE:  ?*Unless otherwise noted by date, all objective measures were captured on initial evaluation.  ?  ?DIAGNOSTIC FINDINGS:  ?01-14-22 Unremarkable MRI brain (with and without).  Few scattered nonspecific foci of T2 hyperintensities / gliosis. No acute findings. ?  ?PATIENT SURVEYS:  ?FOTO 58 eval predicted 65 ?  ?  ?COGNITION: ?Overall cognitive status: Within functional limits for tasks assessed ?  ?  ?SENSATION: ?WFL  except on face sometimes feels like it goes numb on face only ?  ?POSTURE:  ?Obese, forward head and rounded , shoulder  anterior pelvic ?Pt with tightened R jaw compared to Left jaw, visibly shortened jaw line on R ?Jaw protraction veers to R  ?Jaw opening 2.4  cm max  Left 4.5 cm from temporal notch to mandibular notch   5.4 cm on R ?C sign to the R  ?Pt with clicking on opening and protracting jaw ?  ?PALPATION: ?TTP on R jaw over masseter , inside jaw marked TTP over pterygoid R , bil tight upper traps  ?  ?CERVICAL ROM:  ?  ?Active ROM A/PROM (deg) ?05/13/2021  ?Flexion 49  ?Extension 41  ?Right lateral flexion 27 pain  ?Left lateral flexion 20  ?Right rotation 44 ?pain more than left  ?Left rotation 40  ? (Blank rows = not tested) ?  ?UE ROM:  WNL bil UE ?  ?Active ROM Right ?05/13/2021 Left ?05/13/2021  ?Shoulder flexion       ?Shoulder extension      ?Shoulder abduction      ?Shoulder adduction      ?Shoulder extension      ?Shoulder internal rotation      ?Shoulder external rotation      ?Elbow flexion      ?Elbow extension      ?Wrist flexion      ?Wrist extension      ?Wrist ulnar deviation      ?Wrist radial deviation      ?Wrist pronation      ?Wrist supination      ? (Blank rows = not tested) ?  ?UE MMT:   ?  ?MMT Right ?05/13/2021 Left ?05/13/2021  ?Shoulder flexion 4+ 4+  ?Shoulder extension      ?Shoulder abduction 4 4  ?Shoulder adduction      ?Shoulder extension      ?Shoulder internal rotation 4+ 4+  ?Shoulder external rotation 4 4  ?Middle trapezius      ?Lower trapezius      ?Elbow flexion      ?Elbow extension      ?Wrist flexion      ?Wrist extension      ?Wrist ulnar deviation      ?Wrist radial deviation      ?Wrist pronation      ?Wrist supination      ?Grip strength 44 35.33  ? (Blank rows = not tested) ?  ?CERVICAL SPECIAL TESTS:  ?Neck flexor muscle endurance test: Positive, Spurling's test: Negative, and Distraction test: Negative ?Pt holds supine  neck flexion 1 in from mat for 10 sec  35 sec normal ?  ?FUNCTIONAL TESTS:  ?5 times sit to stand: 11.12 sec ?  ?PATIENT SURVEYS:  ?FOTO 58 %  predicted 65% ?  ?TODAY'S TREATMENT:  ? ?[place] Adult PT Treatment:                                                DATE: 05/28/2021 ?Therapeutic Exercise: ?Upper trap stretch 1 x 30 sec ?Lower trap activation 2 x 10 via wall ys ?R TMJ gapping by bitting down on 4 tongue depress under L molars ?Self palpation along mandible combined with opeing to provide external feedback to promote equal openning and closing.  ?Manual Therapy: ?IASTM along the Masseter ?Manibular anterior mobs grade III with pt peformed mandibular depression focusing on the R TMJ ?Self trigger point release and how to perform using tools  ? ?Trigger Point Dry-Needling  ?Treatment instructions: Expect mild to moderate muscle soreness. S/S of pneumothorax if dry  needled over a lung field, and  to seek immediate medical attention should they occur. Patient verbalized understanding of these instructions and education. ? ?Patient Consent Given: Yes ?Education handout provided: Previously provided ?Muscles treated: R masseter, lateral pterygoid ?Electrical stimulation performed: Yes ?Parameters:  for the masseter CPS 20 x 8 min  ?Treatment response/outcome: twitch and muscle lengthening.  ? ? ? ?[facility] Adult PT Treatment:                                                DATE: 05/21/2021 ?Therapeutic Exercise: Cues to keep tongue in resting position behind front teeth.  ?Mandibular depression keeping tongue in resting position 1 x 10 ?UBE L5 x 4 min (FWD/BWD x 2 min ) ?R Upper trap stretch 1 x 30  ?R levator scapulae stretch 1 x 30 sec ?Lower trap activation seated with elbows propped on bolster 1 x 15 with RTB ?Double ER with scapular retraction 1 x 15 with RTB ?Supine chin tuck head lift 5 x 10 sec hold ?Manual Therapy: ?Trigger Point Dry-Needling  ?Treatment instructions: Expect mild to moderate muscle soreness. S/S of pneumothorax if dry needled over a lung field, and to seek immediate medical attention should they occur. Patient verbalized understanding of these instructions and education. ? ?Patient Consent Given: Yes ?Education handout provided: Yes ?Muscles treated: Masseter, upper trap, lateral pterygoid ?Electrical stimulation performed: No ?Parameters: N/A ?Treatment response/outcome: Twitch noted and reduction ?IASTM along the R upper trap, and along the R masseter ? ? ?Performed at evaluation ?Manual- STW to Right masseter and Upper trap, STW to R pterygoid marked tenderness ?Trigger Point Dry Needling Treatment: ?Pre-treatment instruction: Patient instructed on dry needling rationale, procedures, and possible side effects including pain during treatment (achy,cramping feeling), bruising, drop of blood, lightheadedness, nausea, sweating. ?Patient Consent Given:  Yes ?Education handout provided: Yes ?Muscles treated: R upper trap and R masseter  ?Needle size and number:  .25 x 30 mm x 3, .30 x 50 R upper trap ?Electrical stimulation performed: No ?Parameters: N/A ?Treatment response/ou

## 2021-06-03 ENCOUNTER — Encounter: Payer: Self-pay | Admitting: Physical Therapy

## 2021-06-03 ENCOUNTER — Ambulatory Visit: Payer: 59 | Admitting: Physical Therapy

## 2021-06-03 DIAGNOSIS — M6281 Muscle weakness (generalized): Secondary | ICD-10-CM

## 2021-06-03 DIAGNOSIS — M542 Cervicalgia: Secondary | ICD-10-CM

## 2021-06-03 NOTE — Therapy (Signed)
?OUTPATIENT PHYSICAL THERAPY TREATMENT NOTE ? ? ?Patient Name: Angela Ramsey ?MRN: [id number] ?DOB:[birth year], 56 y.o., female ?Today's Date: 06/03/2021 ? ?PCP: Lorrene Reid, PA-C ?REFERRING PROVIDER: Lorrene Reid, PA-C ? ?END OF SESSION:  ? PT End of Session - 06/03/21 1018   ? ? Visit Number 4   ? Number of Visits 12   ? Date for PT Re-Evaluation 06/24/21   ? Authorization Type UHC   ? PT Start Time 1018   ? PT Stop Time 1058   ? PT Time Calculation (min) 40 min   ? Activity Tolerance Patient tolerated treatment well   ? Behavior During Therapy [facility] for tasks assessed/performed   ? ?  ?  ? ?  ? ? ? ? ?Past Medical History:  ?Diagnosis Date  ? Anxiety   ? Endometriosis   ? Headache   ? Heart murmur   ? as a child  ? 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 ATM gene called [id number]  mutation of unknown significance see genetic counseling note 09/21/2014  ? ?Past Surgical History:  ?Procedure Laterality Date  ? BREAST BIOPSY Left 2016  ? BREAST SURGERY    ? Breast Bx benign  ? FOOT SURGERY Left   ? plantar fasciitis  ? NOVASURE ABLATION  10/2004  ? PELVIC LAPAROSCOPY  12/2002  ? endometriosis  ? ?Patient Active Problem List  ? Diagnosis Date Noted  ? Temporal headache 12/02/2020  ? Jaw pain 12/02/2020  ? Vitamin D insufficiency 05/02/2019  ? Insomnia 05/02/2019  ? obesity ([place]) 05/02/2019  ? Eczema 05/02/2019  ? Unspecified menopausal and perimenopausal disorder 05/18/2018  ? Hyperlipidemia 05/18/2018  ? mutation of ATM gene 04/26/2018  ? Strong Family history of breast cancer in females 04/26/2018  ? Genital herpes 02/24/2016  ? Hemorrhoids- small external nonthrombosed hemorrhoid perirectal area at 10 PM 02/24/2016  ? External hemorrhoids without complication 94/17/4081  ? Screening for multiple conditions 12/31/2015  ? Leukocytosis 12/31/2015  ? Environmental and seasonal allergies 12/01/2015  ? BMI 37.0-37.9, adult 10/16/2015  ? GAD (generalized anxiety disorder) 10/16/2015  ? Family history of  early CAD 10/16/2015  ? Nonspecific chest pain 10/16/2015  ? Genetic testing 09/21/2014  ? Family history of breast cancer   ? Family history of thyroid cancer   ? h/o ovarian Endometriosis   ? ? ?REFERRING DIAG:  ?M54.2 (ICD-10-CM) - Cervicalgia  ?R68.84 (ICD-10-CM) - Jaw pain  ? ? ?THERAPY DIAG:  ?Cervicalgia ? ?Muscle weakness (generalized) ? ?PERTINENT HISTORY: Foot surgery, anxiety, endometriosis, headache heart murmur, monoallelic mutation of ATM gene, orthostatic hypotension ? ?PRECAUTIONS: N/A ? ?SUBJECTIVE: " I am doing pretty good. Last Wednesday I was surprised I was able do my exercises the next." ? ?PAIN:  ?Are you having pain? Yes: NPRS scale: 0/10 ?Pain location: R TMJ ?Pain description: sore/ ?Aggravating factors: opening/ closing the mouth ?Relieving factors: exercse,  ? ? ? ? ? ?OBJECTIVE:  ?*Unless otherwise noted by date, all objective measures were captured on initial evaluation.  ?  ?DIAGNOSTIC FINDINGS:  ?01-14-22 Unremarkable MRI brain (with and without).  Few scattered nonspecific foci of T2 hyperintensities / gliosis. No acute findings. ?  ?PATIENT SURVEYS:  ?FOTO 58 eval predicted 65 ?  ?  ?COGNITION: ?Overall cognitive status: Within functional limits for tasks assessed ?  ?  ?SENSATION: ?WFL  except on face sometimes feels like it goes numb on face only ?  ?POSTURE:  ?Obese, forward head and rounded , shoulder  anterior pelvic ?Pt with  tightened R jaw compared to Left jaw, visibly shortened jaw line on R ?Jaw protraction veers to R  ?Jaw opening 2.4 cm max  Left 4.5 cm from temporal notch to mandibular notch   5.4 cm on R ?C sign to the R  ?Pt with clicking on opening and protracting jaw ?  ?PALPATION: ?TTP on R jaw over masseter , inside jaw marked TTP over pterygoid R , bil tight upper traps  ?  ?CERVICAL ROM:  ?  ?Active ROM A/PROM (deg) ?05/13/2021  ?Flexion 49  ?Extension 41  ?Right lateral flexion 27 pain  ?Left lateral flexion 20  ?Right rotation 44 ?pain more than left  ?Left  rotation 40  ? (Blank rows = not tested) ?  ?UE ROM:  WNL bil UE ?  ?Active ROM Right ?05/13/2021 Left ?05/13/2021  ?Shoulder flexion      ?Shoulder extension      ?Shoulder abduction      ?Shoulder adduction      ?Shoulder extension      ?Shoulder internal rotation      ?Shoulder external rotation      ?Elbow flexion      ?Elbow extension      ?Wrist flexion      ?Wrist extension      ?Wrist ulnar deviation      ?Wrist radial deviation      ?Wrist pronation      ?Wrist supination      ? (Blank rows = not tested) ?  ?UE MMT:   ?  ?MMT Right ?05/13/2021 Left ?05/13/2021  ?Shoulder flexion 4+ 4+  ?Shoulder extension      ?Shoulder abduction 4 4  ?Shoulder adduction      ?Shoulder extension      ?Shoulder internal rotation 4+ 4+  ?Shoulder external rotation 4 4  ?Middle trapezius      ?Lower trapezius      ?Elbow flexion      ?Elbow extension      ?Wrist flexion      ?Wrist extension      ?Wrist ulnar deviation      ?Wrist radial deviation      ?Wrist pronation      ?Wrist supination      ?Grip strength 44 35.33  ? (Blank rows = not tested) ?  ?CERVICAL SPECIAL TESTS:  ?Neck flexor muscle endurance test: Positive, Spurling's test: Negative, and Distraction test: Negative ?Pt holds supine  neck flexion 1 in from mat for 10 sec  35 sec normal ?  ?FUNCTIONAL TESTS:  ?5 times sit to stand: 11.12 sec ?  ?PATIENT SURVEYS:  ?FOTO 58 %  predicted 65% ?  ?TODAY'S TREATMENT:  ? ? ?[place] Adult PT Treatment:                                                DATE: 06/03/2021 ?Therapeutic Exercise: ?R SCM stretch 1 x 30 sec  ?R Upper trap stretch 1 x 30 sec ?R Levator scapulae stretch 1 x 30  ?UBE L5 x 6 min (FWD/ BWD x 3 min ) ?Double ER 2 x 15 with RTB ?Seaed horizontal abduction 2 x 10  ?Manual Therapy: ?MTPR along the R masseter, and temporalis, upper trap and scapulae ?Tack and stretch for upper trap/ levator scapuale ? ? ?[facility] Adult PT Treatment:  DATE: 05/28/2021 ?Therapeutic  Exercise: ?Upper trap stretch 1 x 30 sec ?Lower trap activation 2 x 10 via wall ys ?R TMJ gapping by bitting down on 4 tongue depress under L molars ?Self palpation along mandible combined with opeing to provide external feedback to promote equal openning and closing.  ?Manual Therapy: ?IASTM along the Masseter ?Manibular anterior mobs grade III with pt peformed mandibular depression focusing on the R TMJ ?Self trigger point release and how to perform using tools  ? ?Trigger Point Dry-Needling  ?Treatment instructions: Expect mild to moderate muscle soreness. S/S of pneumothorax if dry needled over a lung field, and to seek immediate medical attention should they occur. Patient verbalized understanding of these instructions and education. ? ?Patient Consent Given: Yes ?Education handout provided: Previously provided ?Muscles treated: R masseter, lateral pterygoid ?Electrical stimulation performed: Yes ?Parameters:  for the masseter CPS 20 x 8 min  ?Treatment response/outcome: twitch and muscle lengthening.  ? ? ? ?[facility] Adult PT Treatment:                                                DATE: 05/21/2021 ?Therapeutic Exercise: Cues to keep tongue in resting position behind front teeth.  ?Mandibular depression keeping tongue in resting position 1 x 10 ?UBE L5 x 4 min (FWD/BWD x 2 min ) ?R Upper trap stretch 1 x 30  ?R levator scapulae stretch 1 x 30 sec ?Lower trap activation seated with elbows propped on bolster 1 x 15 with RTB ?Double ER with scapular retraction 1 x 15 with RTB ?Supine chin tuck head lift 5 x 10 sec hold ?Manual Therapy: ?Trigger Point Dry-Needling  ?Treatment instructions: Expect mild to moderate muscle soreness. S/S of pneumothorax if dry needled over a lung field, and to seek immediate medical attention should they occur. Patient verbalized understanding of these instructions and education. ? ?Patient Consent Given: Yes ?Education handout provided: Yes ?Muscles treated: Masseter, upper trap, lateral  pterygoid ?Electrical stimulation performed: No ?Parameters: N/A ?Treatment response/outcome: Twitch noted and reduction ?IASTM along the R upper trap, and along the R masseter ? ?  ?  ?PATIENT EDUCATION:  ?Education detai

## 2021-06-04 ENCOUNTER — Ambulatory Visit
Admission: RE | Admit: 2021-06-04 | Discharge: 2021-06-04 | Disposition: A | Discharge status: Home / Self Care | Payer: 59 | Source: Ambulatory Visit | Attending: Obstetrics & Gynecology | Admitting: Obstetrics & Gynecology

## 2021-06-04 DIAGNOSIS — Z803 Family history of malignant neoplasm of breast: Secondary | ICD-10-CM

## 2021-06-04 MED ORDER — GADOBUTROL 1 MMOL/ML IV SOLN
10.0000 mL | Freq: Once | INTRAVENOUS | Status: AC | PRN
  Administered 2021-06-04: 10 mL via INTRAVENOUS

## 2021-06-04 NOTE — Therapy (Signed)
?OUTPATIENT PHYSICAL THERAPY TREATMENT NOTE / DISCHARGE ? ? ?Patient Name: Angela Ramsey ?MRN: [id number] ?DOB:02-Apr-1965, 56 y.o., female ?Today's Date: 06/05/2021 ? ?PCP: Lorrene Reid, PA-C ?REFERRING PROVIDER: Genia Harold, MD ? ?END OF SESSION:  ? PT End of Session - 06/05/21 1058   ? ? Visit Number 5   ? Number of Visits 12   ? Date for PT Re-Evaluation 06/24/21   ? Authorization Type [employer]   ? PT Start Time 1059   ? PT Stop Time 1140   ? PT Time Calculation (min) 41 min   ? ?  ?  ? ?  ? ? ? ? ? ?Past Medical History:  ?Diagnosis Date  ? Anxiety   ? Endometriosis   ? Headache   ? Heart murmur   ? as a child  ? 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 ATM gene called [id number]  mutation of unknown significance see genetic counseling note 09/21/2014  ? ?Past Surgical History:  ?Procedure Laterality Date  ? BREAST BIOPSY Left 2016  ? BREAST SURGERY    ? Breast Bx benign  ? FOOT SURGERY Left   ? plantar fasciitis  ? NOVASURE ABLATION  10/2004  ? PELVIC LAPAROSCOPY  12/2002  ? endometriosis  ? ?Patient Active Problem List  ? Diagnosis Date Noted  ? Temporal headache 12/02/2020  ? Jaw pain 12/02/2020  ? Vitamin D insufficiency 05/02/2019  ? Insomnia 05/02/2019  ? obesity ([place]) 05/02/2019  ? Eczema 05/02/2019  ? Unspecified menopausal and perimenopausal disorder 05/18/2018  ? Hyperlipidemia 05/18/2018  ? mutation of ATM gene 04/26/2018  ? Strong Family history of breast cancer in females 04/26/2018  ? Genital herpes 02/24/2016  ? Hemorrhoids- small external nonthrombosed hemorrhoid perirectal area at 10 PM 02/24/2016  ? External hemorrhoids without complication 40/81/4481  ? Screening for multiple conditions 12/31/2015  ? Leukocytosis 12/31/2015  ? Environmental and seasonal allergies 12/01/2015  ? BMI 37.0-37.9, adult 10/16/2015  ? GAD (generalized anxiety disorder) 10/16/2015  ? Family history of early CAD 10/16/2015  ? Nonspecific chest pain 10/16/2015  ? Genetic testing 09/21/2014  ? Family history of  breast cancer   ? Family history of thyroid cancer   ? h/o ovarian Endometriosis   ? ? ?REFERRING DIAG:  ?M54.2 (ICD-10-CM) - Cervicalgia  ?R68.84 (ICD-10-CM) - Jaw pain  ? ? ?THERAPY DIAG:  ?Cervicalgia ? ?Muscle weakness (generalized) ? ?PERTINENT HISTORY: Foot surgery, anxiety, endometriosis, headache heart murmur, monoallelic mutation of ATM gene, orthostatic hypotension ? ?PRECAUTIONS: N/A ? ?SUBJECTIVE: " I am alittle sore in the R shoulder but otherwise I feel like I am doing pretty good." ? ?PAIN:  ?Are you having pain? Yes: NPRS scale: 0/10 ?Pain location: R TMJ ?Pain description: sore/ ?Aggravating factors: opening/ closing the mouth ?Relieving factors: exercse,  ? ? ? ? ? ?OBJECTIVE:  ?*Unless otherwise noted by date, all objective measures were captured on initial evaluation.  ?  ?DIAGNOSTIC FINDINGS:  ?01-14-22 Unremarkable MRI brain (with and without).  Few scattered nonspecific foci of T2 hyperintensities / gliosis. No acute findings. ?  ?PATIENT SURVEYS:  ?FOTO 58 eval predicted 65 ? 06/05/2021 68% ?  ?COGNITION: ?Overall cognitive status: Within functional limits for tasks assessed ?  ?  ?SENSATION: ?WFL  except on face sometimes feels like it goes numb on face only ?  ?POSTURE:  ?Obese, forward head and rounded , shoulder  anterior pelvic ?Pt with tightened R jaw compared to Left jaw, visibly shortened jaw line on R ?Jaw protraction  veers to R  ?Jaw opening 2.4 cm max  Left 4.5 cm from temporal notch to mandibular notch   5.4 cm on R ?C sign to the R  ?Pt with clicking on opening and protracting jaw ?  ?PALPATION: ?TTP on R jaw over masseter , inside jaw marked TTP over pterygoid R , bil tight upper traps  ?  ?CERVICAL ROM:  ?  ?Active ROM A/PROM (deg) ?05/13/2021 AROM ?06/05/2021  ?Flexion 49 52  ?Extension 41 60  ?Right lateral flexion 27 pain 42  ?Left lateral flexion 20 42  ?Right rotation 44 ?pain more than left 78  ?Left rotation 40 78  ? (Blank rows = not tested) ?  ?UE ROM:  WNL bil UE ?   ?Active ROM Right ?05/13/2021 Left ?05/13/2021  ?Shoulder flexion      ?Shoulder extension      ?Shoulder abduction      ?Shoulder adduction      ?Shoulder extension      ?Shoulder internal rotation      ?Shoulder external rotation      ?Elbow flexion      ?Elbow extension      ?Wrist flexion      ?Wrist extension      ?Wrist ulnar deviation      ?Wrist radial deviation      ?Wrist pronation      ?Wrist supination      ? (Blank rows = not tested) ?  ?UE MMT:   ?  ?MMT Right ?05/13/2021 Left ?05/13/2021  ?Shoulder flexion 4+ 4+  ?Shoulder extension      ?Shoulder abduction 4 4  ?Shoulder adduction      ?Shoulder extension      ?Shoulder internal rotation 4+ 4+  ?Shoulder external rotation 4 4  ?Middle trapezius      ?Lower trapezius      ?Elbow flexion      ?Elbow extension      ?Wrist flexion      ?Wrist extension      ?Wrist ulnar deviation      ?Wrist radial deviation      ?Wrist pronation      ?Wrist supination      ?Grip strength 44 35.33  ? (Blank rows = not tested) ?  ?CERVICAL SPECIAL TESTS:  ?Neck flexor muscle endurance test: Positive, Spurling's test: Negative, and Distraction test: Negative ?Pt holds supine  neck flexion 1 in from mat for 10 sec  35 sec normal ?  ?FUNCTIONAL TESTS:  ?5 times sit to stand: 11.12 sec ?  ?PATIENT SURVEYS:  ?FOTO 58 %  predicted 65% ?  ?TODAY'S TREATMENT:  ?[facility] Adult PT Treatment:                                                DATE: 06/05/2021 ?Therapeutic Exercise: ?UBE L5 x 4 min (FWD/ BWD x 2 min ea) ?Rows 2 x 12 with GTB cues to avoid shoulder hiking ?Upper trap stretch, levator scapuale stretch and R SCM 1 x 30 sec ea ?Manual Therapy: ?Trigger point release along the R upper trap/ levator scapulae utilizing theracne ? ? ?[facility] Adult PT Treatment:  DATE: 06/03/2021 ?Therapeutic Exercise: ?R SCM stretch 1 x 30 sec  ?R Upper trap stretch 1 x 30 sec ?R Levator scapulae stretch 1 x 30  ?UBE L5 x 6 min (FWD/ BWD x 3 min ) ?Double ER 2 x  15 with RTB ?Seaed horizontal abduction 2 x 10  ?Manual Therapy: ?MTPR along the R masseter, and temporalis, upper trap and scapulae ?Tack and stretch for upper trap/ levator scapuale ? ? ?[facility] Adult PT Treatment:                                                DATE: 05/28/2021 ?Therapeutic Exercise: ?Upper trap stretch 1 x 30 sec ?Lower trap activation 2 x 10 via wall ys ?R TMJ gapping by bitting down on 4 tongue depress under L molars ?Self palpation along mandible combined with opeing to provide external feedback to promote equal openning and closing.  ?Manual Therapy: ?IASTM along the Masseter ?Manibular anterior mobs grade III with pt peformed mandibular depression focusing on the R TMJ ?Self trigger point release and how to perform using tools  ? ?Trigger Point Dry-Needling  ?Treatment instructions: Expect mild to moderate muscle soreness. S/S of pneumothorax if dry needled over a lung field, and to seek immediate medical attention should they occur. Patient verbalized understanding of these instructions and education. ? ?Patient Consent Given: Yes ?Education handout provided: Previously provided ?Muscles treated: R masseter, lateral pterygoid ?Electrical stimulation performed: Yes ?Parameters:  for the masseter CPS 20 x 8 min  ?Treatment response/outcome: twitch and muscle lengthening.  ? ?  ?  ?PATIENT EDUCATION:  ?Education details: POC  Explanation of findings.  FOTO report, initial HEP and TPDN education, intial posture ?Person educated: Patient ?Education method: Explanation, Demonstration, Tactile cues, Verbal cues, and Handouts ?Education comprehension: verbalized understanding, returned demonstration, verbal cues required, tactile cues required, and needs further education ?  ?  ?HOME EXERCISE PROGRAM: ?Access Code: [email or web address] ?URL: [email or web address] ?Date: 06/03/2021 ?Prepared by: Starr Lake ? ?Exercises ?- External Jaw Release  - 1 x daily - 7 x weekly - 3 sets - 10 reps ?- Jaw  Protraction  - 1 x daily - 7 x weekly - 3 sets - 10 reps ?- Supine Deep Neck Flexor Training  - 2-3 x daily - 7 x weekly - 10 reps - 3 hold ?- Seated Cervical Retraction Protraction AROM  - 2-3 x daily -

## 2021-06-05 ENCOUNTER — Ambulatory Visit: Payer: 59 | Admitting: Physical Therapy

## 2021-06-05 ENCOUNTER — Encounter: Payer: Self-pay | Admitting: Physical Therapy

## 2021-06-05 DIAGNOSIS — M542 Cervicalgia: Secondary | ICD-10-CM | POA: Diagnosis not present

## 2021-06-05 DIAGNOSIS — M6281 Muscle weakness (generalized): Secondary | ICD-10-CM

## 2021-06-09 ENCOUNTER — Ambulatory Visit: Payer: 59 | Admitting: Physical Therapy

## 2021-06-19 ENCOUNTER — Encounter: Payer: 59 | Admitting: Physical Therapy

## 2021-06-23 ENCOUNTER — Other Ambulatory Visit: Payer: Self-pay | Admitting: Obstetrics & Gynecology

## 2021-06-24 ENCOUNTER — Encounter: Payer: 59 | Admitting: Physical Therapy

## 2021-07-01 ENCOUNTER — Ambulatory Visit (INDEPENDENT_AMBULATORY_CARE_PROVIDER_SITE_OTHER): Payer: 59 | Admitting: Physician Assistant

## 2021-07-01 ENCOUNTER — Encounter: Payer: Self-pay | Admitting: Physician Assistant

## 2021-07-01 VITALS — BP 112/74 | HR 78 | Temp 97.7°F | Ht 59.0 in | Wt 208.0 lb

## 2021-07-01 DIAGNOSIS — E785 Hyperlipidemia, unspecified: Secondary | ICD-10-CM

## 2021-07-01 DIAGNOSIS — G43009 Migraine without aura, not intractable, without status migrainosus: Secondary | ICD-10-CM | POA: Diagnosis not present

## 2021-07-01 DIAGNOSIS — F411 Generalized anxiety disorder: Secondary | ICD-10-CM

## 2021-07-01 DIAGNOSIS — M25562 Pain in left knee: Secondary | ICD-10-CM

## 2021-07-01 DIAGNOSIS — M25561 Pain in right knee: Secondary | ICD-10-CM | POA: Diagnosis not present

## 2021-07-01 DIAGNOSIS — G8929 Other chronic pain: Secondary | ICD-10-CM

## 2021-07-01 NOTE — Assessment & Plan Note (Signed)
-  Stable. -Continue current medication regimen.  -Will continue to monitor. 

## 2021-07-01 NOTE — Assessment & Plan Note (Signed)
-  Followed by neurology. ?-Controlled with medication therapy. Recommend to continue with Topiramate 100 mg. ?

## 2021-07-01 NOTE — Patient Instructions (Signed)
Vitamin D Deficiency Vitamin D deficiency is when your body does not have enough vitamin D. Vitamin D is important because: It helps your body use certain minerals. It helps to keep your bones healthy. It lessens irritation and swelling (inflammation). It helps the body's defense system (immune system) work better. Not getting enough vitamin D can make your bones soft. What are the causes? Not eating enough foods that have vitamin D in them. Not getting enough sun. Having diseases that make it hard for your body to take in vitamin D. Having had part of your stomach or part of your small intestine taken out. What increases the risk? Being an older adult. Not spending much time outdoors. Living in a long-term care center. Having dark skin. Taking certain medicines. Being overweight or very overweight (obese). Having long-term (chronic) kidney or liver disease. What are the signs or symptoms? In mild cases, there may be no symptoms. If the condition is very bad, symptoms may include: Bone pain. Muscle pain. Not being able to walk normally. Bones that break easily. Joint pain. How is this treated? Treatment may include taking supplements as told by your doctor. Your doctor will tell you what dose is best for you. This may include taking: Vitamin D. Calcium. Follow these instructions at home: Eating and drinking Eat foods that have vitamin D in them, such as: Dairy products, cereals, or juices that have vitamin D added to them (are fortified). Check the label. Fish, such as salmon or trout. Eggs. The vitamin D is in the yolk. Mushrooms that were treated with UV light. Beef liver. The items listed above may not be a complete list of foods and beverages you can eat and drink. Contact a dietitian for more information. General instructions Take over-the-counter and prescription medicines only as told by your doctor. Take supplements only as told by your doctor. Get sunlight in a  safe way. Do not use a tanning bed. Stay at a healthy weight. Lose weight if you need to. Keep all follow-up visits. How is this prevented? Eating foods that naturally have vitamin D in them. Eating or drinking foods and drinks that have vitamin D added to them, such as cereals, juices, and milk. Taking vitamin D or a multivitamin that has vitamin D in it. Being in the sun. Your body makes vitamin D when your skin gets sunlight. Contact a doctor if: Your symptoms do not go away. You feel like you may vomit (nauseous). You vomit. You poop less often than normal, or you have trouble pooping (constipation). Summary Vitamin D deficiency is when your body does not have enough vitamin D. Vitamin D helps to keep your bones healthy. This condition is often treated by taking a supplement. Your doctor will tell you what dose is best for you. This information is not intended to replace advice given to you by your health care provider. Make sure you discuss any questions you have with your health care provider. Document Revised: 11/08/2020 Document Reviewed: 11/08/2020 Elsevier Patient Education  2023 Elsevier Inc.  

## 2021-07-01 NOTE — Progress Notes (Signed)
?Established patient visit ? ? ?Patient: Angela Ramsey   DOB: 1965/08/27   56 y.o. Female  MRN: [id number] ?Visit Date: 07/01/2021 ? ?No chief complaint on file. ? ?Subjective  ?  ?HPI  ?Patient presents for follow-up on mood, hyperlipidemia and headache. Reports has not experienced a headache since her Topamax was increased. No longer having dizziness or blurred vision.  ? ?HLD: Patient trying to manage with diet and lifestyle. Patient reports not able to exercise as much due to bilateral knee pain and clicking sensation which has progressively worsened.  ? ?Mood: Reports mood has been stable. Continues with Prozac 40 mg. No SI/HI. ? ? ?  07/01/2021  ?  9:32 AM 03/31/2021  ?  9:25 AM 03/11/2021  ?  1:38 PM 03/05/2021  ?  4:15 PM 01/03/2021  ? 10:42 AM  ?Depression screen PHQ 2/9  ?Decreased Interest 0 0 '1 1 1  ' ?Down, Depressed, Hopeless 0 0 0 0 1  ?PHQ - 2 Score 0 0 '1 1 2  ' ?Altered sleeping '1 1 1 1 3  ' ?Tired, decreased energy '1 1 3 3 3  ' ?Change in appetite 0 0 0 0 0  ?Feeling bad or failure about yourself  0 0 0 0 0  ?Trouble concentrating 0 0 1 1 0  ?Moving slowly or fidgety/restless 0 0 0 0 0  ?Suicidal thoughts 0 0 0 0 0  ?PHQ-9 Score '2 2 6 6 8  ' ?Difficult doing work/chores Not difficult at all Not difficult at all Not difficult at all Somewhat difficult Somewhat difficult  ? ? ?  07/01/2021  ?  9:32 AM 03/11/2021  ?  1:38 PM 03/05/2021  ?  4:15 PM 01/03/2021  ? 10:42 AM  ?GAD 7 : Generalized Anxiety Score  ?Nervous, Anxious, on Edge 0 0 0 0  ?Control/stop worrying 0 0 0 0  ?Worry too much - different things 0 0 0 1  ?Trouble relaxing 0 0 0 0  ?Restless 0 0 0 0  ?Easily annoyed or irritable 0 0 1 1  ?Afraid - awful might happen 0 0 0 0  ?Total GAD 7 Score 0 0 1 2  ?Anxiety Difficulty Not difficult at all Not difficult at all Somewhat difficult   ? ? ?  ? ? ?Medications: ?Outpatient Medications Prior to Visit  ?Medication Sig  ? B Complex Vitamins (VITAMIN B COMPLEX PO) Take by mouth.  ? Cholecalciferol (VITAMIN  D) 50 MCG (2000 UT) tablet Take 1 tablet (2,000 Units total) by mouth daily.  ? cyclobenzaprine (FLEXERIL) 5 MG tablet Take 1 tablet (5 mg total) by mouth at bedtime as needed for muscle spasms.  ? FLUoxetine (PROZAC) 40 MG capsule TAKE 1 CAPSULE BY MOUTH EVERY DAY  ? Multiple Vitamins-Minerals (ALIVE MULTI-VITAMIN) CHEW Chew by mouth.  ? Omega-3 Fatty Acids (FISH OIL PO) Take 1 capsule by mouth daily. Unsure of dosage  ? topiramate (TOPAMAX) 100 MG tablet Take 1 tablet (100 mg total) by mouth at bedtime.  ? triamcinolone (KENALOG) 0.025 % ointment Apply 1 application topically at bedtime. (Patient taking differently: Apply 1 application. topically as needed.)  ? [DISCONTINUED] metoprolol tartrate (LOPRESSOR) 100 MG tablet Take 1 tablet (100 mg total) by mouth once for 1 dose. Take 90-120 minutes prior to scan.  ? ?No facility-administered medications prior to visit.  ? ? ?Review of Systems ?Review of Systems:  ?A fourteen system review of systems was performed and found to be positive as per HPI. ? ?Last CBC ?Lab  Results  ?Component Value Date  ? WBC 10.7 (H) 03/24/2021  ? HGB 13.5 03/24/2021  ? HCT 42.6 03/24/2021  ? MCV 92.6 03/24/2021  ? MCH 29.3 03/24/2021  ? RDW 13.9 03/24/2021  ? PLT 325 03/24/2021  ? ?Last metabolic panel ?Lab Results  ?Component Value Date  ? GLUCOSE 98 03/31/2021  ? NA 142 03/31/2021  ? K 4.6 03/31/2021  ? CL 105 03/31/2021  ? CO2 25 03/31/2021  ? BUN 15 03/31/2021  ? CREATININE 0.71 03/31/2021  ? EGFR 102 03/31/2021  ? CALCIUM 9.5 03/31/2021  ? PROT 7.0 03/31/2021  ? ALBUMIN 4.5 03/31/2021  ? LABGLOB 2.5 03/31/2021  ? AGRATIO 1.8 03/31/2021  ? BILITOT 0.3 03/31/2021  ? ALKPHOS 127 (H) 03/31/2021  ? AST 17 03/31/2021  ? ALT 16 03/31/2021  ? ANIONGAP 8 03/24/2021  ? ?Last lipids ?Lab Results  ?Component Value Date  ? CHOL 259 (H) 01/03/2021  ? HDL 65 01/03/2021  ? LDLCALC 173 (H) 01/03/2021  ? TRIG 118 01/03/2021  ? CHOLHDL 4.0 01/03/2021  ? ?Last hemoglobin A1c ?Lab Results  ?Component  Value Date  ? HGBA1C 5.6 01/03/2021  ? ?Last thyroid functions ?Lab Results  ?Component Value Date  ? TSH 1.580 03/31/2021  ? T3TOTAL 121 12/05/2018  ? ?Last vitamin D ?Lab Results  ?Component Value Date  ? VD25OH 65.5 01/03/2021  ? ?  Objective  ?  ?BP 112/74   Pulse 78   Temp 97.7 ?F (36.5 ?C)   Ht '4\' 11"'  (1.499 m)   Wt 208 lb (94.3 kg)   LMP 12/25/2015 (Exact Date)   SpO2 99%   BMI 42.01 kg/m?  ?BP Readings from Last 3 Encounters:  ?07/01/21 112/74  ?04/14/21 120/80  ?04/07/21 130/80  ? ?Wt Readings from Last 3 Encounters:  ?07/01/21 208 lb (94.3 kg)  ?04/07/21 205 lb (93 kg)  ?04/01/21 203 lb 9.6 oz (92.4 kg)  ? ? ?Physical Exam  ?General:  Well Developed, well nourished, appropriate for stated age.  ?Neuro:  Alert and oriented,  extra-ocular muscles intact  ?HEENT:  Normocephalic, atraumatic, neck supple  ?Skin:  no gross rash, warm, pink. ?Cardiac:  RRR, S1 S2 ?Respiratory: CTA B/L  ?Vascular:  Ext warm, no cyanosis apprec.; cap RF less 2 sec. ?Psych:  No HI/SI, judgement and insight good, Euthymic mood. Full Affect. ? ? ?No results found for any visits on 07/01/21. ? Assessment & Plan  ?  ? ? ?Problem List Items Addressed This Visit   ? ?  ? Cardiovascular and Mediastinum  ? Migraine without aura and without status migrainosus, not intractable  ?  -Followed by neurology. ?-Controlled with medication therapy. Recommend to continue with Topiramate 100 mg. ? ?  ?  ?  ? Other  ? GAD (generalized anxiety disorder) (Chronic)  ?  -Stable. ?-Continue current medication regimen. ?-Will continue to monitor. ? ?  ?  ? Hyperlipidemia - Primary  ?  -Last lipid panel: HDL 65, LDL 173 ?-04/14/2021: Coronary calcium score of 0. This was 1st percentile for age, sex, and race matched control. Will continue with diet and lifestyle interventions. Will repeat lipid panel today. ? ?  ?  ? Relevant Orders  ? CBC w/Diff  ? Comp Met (CMET)  ? Lipid Profile  ? ?Other Visit Diagnoses   ? ? Chronic pain of both knees      ?  Relevant Orders  ? Ambulatory referral to Orthopedic Surgery  ? ?  ? ?Chronic pain of both  knees: ?-Discussed with patient likely OA. Due to progressive symptoms will place orthopedic referral. Recommend to continue with topical anti-inflammatory as needed.  ? ? ?Return in about 6 months (around 01/01/2022) for CPE with [facility]W include Vit D few days before.  ?   ? ? ? ?Lorrene Reid, PA-C  ?[facility] ?[phone] (phone) ?[phone] (fax) ? ?[facility] ?

## 2021-07-01 NOTE — Assessment & Plan Note (Signed)
-  Last lipid panel: HDL 65, LDL 173 ?-04/14/2021: Coronary calcium score of 0. This was 1st percentile for age, sex, and race matched control. Will continue with diet and lifestyle interventions. Will repeat lipid panel today. ?

## 2021-07-02 LAB — COMPREHENSIVE METABOLIC PANEL
ALT: 19 IU/L (ref 0–32)
AST: 21 IU/L (ref 0–40)
Albumin/Globulin Ratio: 1.6 (ref 1.2–2.2)
Albumin: 4.5 g/dL (ref 3.8–4.9)
Alkaline Phosphatase: 140 IU/L — ABNORMAL HIGH (ref 44–121)
BUN/Creatinine Ratio: 18 (ref 9–23)
BUN: 15 mg/dL (ref 6–24)
Bilirubin Total: 0.4 mg/dL (ref 0.0–1.2)
CO2: 22 mmol/L (ref 20–29)
Calcium: 9.6 mg/dL (ref 8.7–10.2)
Chloride: 103 mmol/L (ref 96–106)
Creatinine, Ser: 0.84 mg/dL (ref 0.57–1.00)
Globulin, Total: 2.8 g/dL (ref 1.5–4.5)
Glucose: 88 mg/dL (ref 70–99)
Potassium: 4.7 mmol/L (ref 3.5–5.2)
Sodium: 140 mmol/L (ref 134–144)
Total Protein: 7.3 g/dL (ref 6.0–8.5)
eGFR: 83 mL/min/{1.73_m2} (ref >59)

## 2021-07-02 LAB — CBC WITH DIFFERENTIAL/PLATELET
Basophils Absolute: 0.1 10*3/uL (ref 0.0–0.2)
Basos: 1 %
EOS (ABSOLUTE): 0.4 10*3/uL (ref 0.0–0.4)
Eos: 4 %
Hematocrit: 42.7 % (ref 34.0–46.6)
Hemoglobin: 13.9 g/dL (ref 11.1–15.9)
Immature Grans (Abs): 0.1 10*3/uL (ref 0.0–0.1)
Immature Granulocytes: 1 %
Lymphocytes Absolute: 3.1 10*3/uL (ref 0.7–3.1)
Lymphs: 30 %
MCH: 29.3 pg (ref 26.6–33.0)
MCHC: 32.6 g/dL (ref 31.5–35.7)
MCV: 90 fL (ref 79–97)
Monocytes Absolute: 0.4 10*3/uL (ref 0.1–0.9)
Monocytes: 4 %
Neutrophils Absolute: 6.2 10*3/uL (ref 1.4–7.0)
Neutrophils: 60 %
Platelets: 337 10*3/uL (ref 150–450)
RBC: 4.75 x10E6/uL (ref 3.77–5.28)
RDW: 12.4 % (ref 11.7–15.4)
WBC: 10.2 10*3/uL (ref 3.4–10.8)

## 2021-07-02 LAB — LIPID PANEL
Chol/HDL Ratio: 3.9 ratio (ref 0.0–4.4)
Cholesterol, Total: 242 mg/dL — ABNORMAL HIGH (ref 100–199)
HDL: 62 mg/dL (ref >39)
LDL Chol Calc (NIH): 155 mg/dL — ABNORMAL HIGH (ref 0–99)
Triglycerides: 139 mg/dL (ref 0–149)
VLDL Cholesterol Cal: 25 mg/dL (ref 5–40)

## 2021-07-10 ENCOUNTER — Ambulatory Visit: Payer: Self-pay

## 2021-07-10 ENCOUNTER — Ambulatory Visit (INDEPENDENT_AMBULATORY_CARE_PROVIDER_SITE_OTHER): Payer: 59

## 2021-07-10 ENCOUNTER — Ambulatory Visit (INDEPENDENT_AMBULATORY_CARE_PROVIDER_SITE_OTHER): Payer: 59 | Admitting: Orthopedic Surgery

## 2021-07-10 DIAGNOSIS — M25562 Pain in left knee: Secondary | ICD-10-CM

## 2021-07-10 DIAGNOSIS — M25561 Pain in right knee: Secondary | ICD-10-CM

## 2021-07-10 DIAGNOSIS — G8929 Other chronic pain: Secondary | ICD-10-CM

## 2021-07-16 ENCOUNTER — Encounter: Payer: Self-pay | Admitting: Orthopedic Surgery

## 2021-07-16 DIAGNOSIS — M25561 Pain in right knee: Secondary | ICD-10-CM

## 2021-07-16 NOTE — Progress Notes (Signed)
Office Visit Note   Patient: Angela Ramsey           Date of Birth: 17-May-1965           MRN: [id number] Visit Date: 07/10/2021              Requested by: Lorrene Reid, PA-C [place],  [place] [address] PCP: Lorrene Reid, PA-C  Chief Complaint  Patient presents with   Left Knee - Pain   Right Knee - Pain      HPI: Patient is a 56 year old woman with osteoarthritis both knees.  Patient states she has clicking while going up and down stairs pain is worst in the right than the left.  Patient states she fell on her right knee about a month ago.  Assessment & Plan: Visit Diagnoses:  1. Bilateral chronic knee pain     Plan: Both knees were injected she tolerated this well  Follow-Up Instructions: Return if symptoms worsen or fail to improve.   Ortho Exam  Patient is alert, oriented, no adenopathy, well-dressed, normal affect, normal respiratory effort. Examination patient has crepitation in the patellofemoral joint with range of motion of both knees.  The medial joint line of both knees is tender to palpation.  Collaterals and cruciates are stable bilaterally.  Imaging: No results found. No images are attached to the encounter.  Labs: Lab Results  Component Value Date   HGBA1C 5.6 01/03/2021   HGBA1C 5.3 07/03/2020   HGBA1C 5.5 01/04/2020   ESRSEDRATE 28 12/23/2020   ESRSEDRATE 19 10/31/2020   CRP 8.4 (H) 12/23/2020   CRP 8 10/31/2020   CRP 6 07/03/2020   LABORGA  10/30/2015    Three or more organisms present,each greater than 10,000 CFU/mL.These organisms,commonly found on external and internal genitalia,are considered to be colonizers.No further testing performed.      Lab Results  Component Value Date   ALBUMIN 4.5 07/01/2021   ALBUMIN 4.5 03/31/2021   ALBUMIN 4.4 01/03/2021    No results found for: MG Lab Results  Component Value Date   VD25OH 65.5 01/03/2021   VD25OH 43.6 07/03/2020   VD25OH 43.6 01/04/2020     No results found for: PREALBUMIN    Latest Ref Rng & Units 07/01/2021   10:06 AM 03/24/2021   10:14 AM 02/03/2021   10:33 AM  CBC EXTENDED  WBC 3.4 - 10.8 x10E3/uL 10.2   10.7   9.0    RBC 3.77 - 5.28 x10E6/uL 4.75   4.60   4.61    Hemoglobin 11.1 - 15.9 g/dL 13.9   13.5   13.7    HCT 34.0 - 46.6 % 42.7   42.6   41.4    Platelets 150 - 450 x10E3/uL 337   325   360    NEUT# 1.4 - 7.0 x10E3/uL 6.2    5.5    Lymph# 0.7 - 3.1 x10E3/uL 3.1    3.0       There is no height or weight on file to calculate BMI.  Orders:  Orders Placed This Encounter  Procedures   XR Knee 1-2 Views Right   XR Knee 1-2 Views Left   No orders of the defined types were placed in this encounter.    Procedures: Large Joint Inj: bilateral knee on 07/16/2021 4:20 PM Indications: pain and diagnostic evaluation Details: 22 G 1.5 in needle, anteromedial approach  Arthrogram: No  Outcome: tolerated well, no immediate complications Procedure, treatment alternatives, risks  and benefits explained, specific risks discussed. Consent was given by the patient. Immediately prior to procedure a time out was called to verify the correct patient, procedure, equipment, support staff and site/side marked as required. Patient was prepped and draped in the usual sterile fashion.     Clinical Data: No additional findings.  ROS:  All other systems negative, except as noted in the HPI. Review of Systems  Objective: Vital Signs: LMP 12/25/2015 (Exact Date)   Specialty Comments:  No specialty comments available.  PMFS History: Patient Active Problem List   Diagnosis Date Noted   Migraine without aura and without status migrainosus, not intractable 07/01/2021   Temporal headache 12/02/2020   Jaw pain 12/02/2020   Vitamin D insufficiency 05/02/2019   Insomnia 05/02/2019   obesity ([place]) 05/02/2019   Eczema 05/02/2019   Unspecified menopausal and perimenopausal disorder 05/18/2018   Hyperlipidemia 05/18/2018    mutation of ATM gene 04/26/2018   Strong Family history of breast cancer in females 04/26/2018   Genital herpes 02/24/2016   Hemorrhoids- small external nonthrombosed hemorrhoid perirectal area at 10 PM 02/24/2016   External hemorrhoids without complication 95/62/1308   Screening for multiple conditions 12/31/2015   Leukocytosis 12/31/2015   Environmental and seasonal allergies 12/01/2015   BMI 37.0-37.9, adult 10/16/2015   GAD (generalized anxiety disorder) 10/16/2015   Family history of early CAD 10/16/2015   Nonspecific chest pain 10/16/2015   Genetic testing 09/21/2014   Family history of breast cancer    Family history of thyroid cancer    h/o ovarian Endometriosis    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   ATM gene called c.7187C>G  mutation of unknown significance see genetic counseling note 09/21/2014    Family History  Problem Relation Age of Onset   Hypertension Mother    Hypertension Father    Hyperlipidemia Father    Stroke Father    Thyroid cancer Sister        dx in her early to mid 1s   Cancer Sister        thyroid   Breast cancer Maternal Aunt 67       mother's identical twin   Cancer Maternal Aunt        breast   Breast cancer Maternal Aunt 49       second cancer at  20   Cancer Maternal Aunt        breast x 2   Breast cancer Paternal Aunt 86   Breast cancer Maternal Grandmother        dx in her late 44s to early 63s   Cancer Maternal Grandmother        breast   Cancer Maternal Grandfather        lung   Diabetes Paternal Grandmother    Leukemia Paternal Grandfather    Cancer Paternal Grandfather        leukemia   Colon cancer Neg Hx    Esophageal cancer Neg Hx    Rectal cancer Neg Hx    Stomach cancer Neg Hx     Past Surgical History:  Procedure Laterality Date   BREAST BIOPSY Left 2016   BREAST SURGERY     Breast Bx benign    FOOT SURGERY Left    plantar fasciitis   NOVASURE ABLATION  10/2004   PELVIC  LAPAROSCOPY  12/2002   endometriosis   Social History   Occupational History   Not on file  Tobacco Use   Smoking status: Never   Smokeless tobacco: Never  Vaping Use   Vaping Use: Never used  Substance and Sexual Activity   Alcohol use: Yes    Comment: 1 every other week or less   Drug use: No   Sexual activity: Yes    Partners: Male    Birth control/protection: Other-see comments, Post-menopausal    Comment: 1st intercourse 27 yo-5 partners- married- 34 yrs, husband vasectomy, ablation

## 2021-07-23 ENCOUNTER — Other Ambulatory Visit: Payer: Self-pay | Admitting: Obstetrics & Gynecology

## 2021-08-13 ENCOUNTER — Other Ambulatory Visit: Payer: Self-pay | Admitting: Psychiatry

## 2021-08-25 ENCOUNTER — Other Ambulatory Visit: Payer: Self-pay | Admitting: Obstetrics & Gynecology

## 2021-09-02 ENCOUNTER — Ambulatory Visit (AMBULATORY_SURGERY_CENTER): Payer: Self-pay | Admitting: *Deleted

## 2021-09-02 VITALS — Ht 60.0 in | Wt 209.0 lb

## 2021-09-02 DIAGNOSIS — Z86010 Personal history of colonic polyps: Secondary | ICD-10-CM

## 2021-09-02 MED ORDER — NA SULFATE-K SULFATE-MG SULF 17.5-3.13-1.6 GM/177ML PO SOLN: 1.0000 | Freq: Once | ORAL | 0 refills | Status: AC

## 2021-09-02 NOTE — Progress Notes (Signed)
No egg or soy allergy known to patient  No issues known to pt with past sedation with any surgeries or procedures Patient denies ever being told they had issues or difficulty with intubation  No FH of Malignant Hyperthermia Pt is not on diet pills Pt is not on home 02  Pt is not on blood thinners  Pt denies issues with constipation  No A fib or A flutter Have any cardiac testing pending--NO Pt instructed to use [email or web address] or GoodRx for a price reduction on prep   

## 2021-09-22 ENCOUNTER — Other Ambulatory Visit: Payer: Self-pay | Admitting: Obstetrics & Gynecology

## 2021-09-22 NOTE — Telephone Encounter (Signed)
Last annual exam was 01/2021 No refills left per pharmacy. Last refill picked up was July 2023

## 2021-09-24 ENCOUNTER — Encounter: Payer: Self-pay | Admitting: Gastroenterology

## 2021-09-26 ENCOUNTER — Other Ambulatory Visit: Payer: Self-pay | Admitting: Obstetrics & Gynecology

## 2021-09-26 DIAGNOSIS — Z1231 Encounter for screening mammogram for malignant neoplasm of breast: Secondary | ICD-10-CM

## 2021-09-30 ENCOUNTER — Encounter: Payer: Self-pay | Admitting: Gastroenterology

## 2021-09-30 ENCOUNTER — Ambulatory Visit (AMBULATORY_SURGERY_CENTER): Payer: 59 | Admitting: Gastroenterology

## 2021-09-30 VITALS — BP 126/75 | HR 65 | Temp 96.0°F | Resp 12 | Ht 60.0 in | Wt 209.0 lb

## 2021-09-30 DIAGNOSIS — Z86010 Personal history of colonic polyps: Secondary | ICD-10-CM | POA: Diagnosis not present

## 2021-09-30 DIAGNOSIS — K635 Polyp of colon: Secondary | ICD-10-CM | POA: Diagnosis not present

## 2021-09-30 DIAGNOSIS — Z09 Encounter for follow-up examination after completed treatment for conditions other than malignant neoplasm: Secondary | ICD-10-CM

## 2021-09-30 DIAGNOSIS — D122 Benign neoplasm of ascending colon: Secondary | ICD-10-CM

## 2021-09-30 MED ORDER — SODIUM CHLORIDE 0.9 % IV SOLN: 500.0000 mL | INTRAVENOUS | Status: DC

## 2021-09-30 NOTE — Patient Instructions (Signed)
Read all of the handouts given to you by your recovery room nurse.  YOU HAD AN ENDOSCOPIC PROCEDURE TODAY AT [facility]:   Refer to the procedure report that was given to you for any specific questions about what was found during the examination.  If the procedure report does not answer your questions, please call your gastroenterologist to clarify.  If you requested that your care partner not be given the details of your procedure findings, then the procedure report has been included in a sealed envelope for you to review at your convenience later.  YOU SHOULD EXPECT: Some feelings of bloating in the abdomen. Passage of more gas than usual.  Walking can help get rid of the air that was put into your GI tract during the procedure and reduce the bloating. If you had a lower endoscopy (such as a colonoscopy or flexible sigmoidoscopy) you may notice spotting of blood in your stool or on the toilet paper. If you underwent a bowel prep for your procedure, you may not have a normal bowel movement for a few days.  Please Note:  You might notice some irritation and congestion in your nose or some drainage.  This is from the oxygen used during your procedure.  There is no need for concern and it should clear up in a day or so.  SYMPTOMS TO REPORT IMMEDIATELY:  Following lower endoscopy (colonoscopy or flexible sigmoidoscopy):  Excessive amounts of blood in the stool  Significant tenderness or worsening of abdominal pains  Swelling of the abdomen that is new, acute  Fever of 100F or higher   For urgent or emergent issues, a gastroenterologist can be reached at any hour by calling [phone]. Do not use MyChart messaging for urgent concerns.    DIET:  We do recommend a small meal at first, but then you may proceed to your regular diet.  Drink plenty of fluids but you should avoid alcoholic beverages for 24 hours.  Try to eat a high fiber diet, and drink plenty of water.  ACTIVITY:   You should plan to take it easy for the rest of today and you should NOT DRIVE or use heavy machinery until tomorrow (because of the sedation medicines used during the test).    FOLLOW UP: Our staff will call the number listed on your records the next business day following your procedure.  We will call around 7:15- 8:00 am to check on you and address any questions or concerns that you may have regarding the information given to you following your procedure. If we do not reach you, we will leave a message.  If you develop any symptoms (ie: fever, flu-like symptoms, shortness of breath, cough etc.) before then, please call [phone].  If you test positive for Covid 19 in the 2 weeks post procedure, please call and report this information to [place].    If any biopsies were taken you will be contacted by phone or by letter within the next 1-3 weeks.  Please call us at [phone] if you have not heard about the biopsies in 3 weeks.    SIGNATURES/CONFIDENTIALITY: You and/or your care partner have signed paperwork which will be entered into your electronic medical record.  These signatures attest to the fact that that the information above on your After Visit Summary has been reviewed and is understood.  Full responsibility of the confidentiality of this discharge information lies with you and/or your care-partner.

## 2021-09-30 NOTE — Progress Notes (Signed)
Called to room to assist during endoscopic procedure.  Patient ID and intended procedure confirmed with present staff. Received instructions for my participation in the procedure from the performing physician.  

## 2021-09-30 NOTE — Op Note (Signed)
[facility] Patient Name: Angela Ramsey Procedure Date: 09/30/2021 2:06 PM MRN: [id number] Endoscopist: Remo Lipps P. Havery Moros , MD Age: 56 Referring MD:  Date of Birth: 04/07/1965 Gender: Female Account #: [id number] Procedure:                Colonoscopy Indications:              High risk colon cancer surveillance: Personal                            history of colonic polyps - 5 adenomas removed                            07/2018 Medicines:                Monitored Anesthesia Care Procedure:                Pre-Anesthesia Assessment:                           - Prior to the procedure, a History and Physical                            was performed, and patient medications and                            allergies were reviewed. The patient's tolerance of                            previous anesthesia was also reviewed. The risks                            and benefits of the procedure and the sedation                            options and risks were discussed with the patient.                            All questions were answered, and informed consent                            was obtained. Prior Anticoagulants: The patient has                            taken no previous anticoagulant or antiplatelet                            agents. ASA Grade Assessment: III - A patient with                            severe systemic disease. After reviewing the risks                            and benefits, the patient was deemed in  satisfactory condition to undergo the procedure.                           After obtaining informed consent, the colonoscope                            was passed under direct vision. Throughout the                            procedure, the patient's blood pressure, pulse, and                            oxygen saturations were monitored continuously. The                            CF [id number] #[id number] was introduced through the anus                             and advanced to the the cecum, identified by                            appendiceal orifice and ileocecal valve. The                            colonoscopy was performed without difficulty. The                            patient tolerated the procedure well. The quality                            of the bowel preparation was good. The ileocecal                            valve, appendiceal orifice, and rectum were                            photographed. Scope In: 2:18:18 PM Scope Out: 2:35:57 PM Scope Withdrawal Time: 0 hours 13 minutes 54 seconds  Total Procedure Duration: 0 hours 17 minutes 39 seconds  Findings:                 The perianal and digital rectal examinations were                            normal. Perianal dermatitis noted.                           Two flat polyps were found in the ascending colon.                            The polyps were 2 to 3 mm in size. These polyps                            were removed with a cold snare. Resection and  retrieval were complete.                           A few small-mouthed diverticula were found in the                            sigmoid colon.                           Internal hemorrhoids were found during retroflexion.                           The exam was otherwise without abnormality. Complications:            No immediate complications. Estimated blood loss:                            Minimal. Estimated Blood Loss:     Estimated blood loss was minimal. Impression:               - Two 2 to 3 mm polyps in the ascending colon,                            removed with a cold snare. Resected and retrieved.                           - Diverticulosis in the sigmoid colon.                           - Internal hemorrhoids.                           - The examination was otherwise normal. Recommendation:           - Patient has a contact number available for                             emergencies. The signs and symptoms of potential                            delayed complications were discussed with the                            patient. Return to normal activities tomorrow.                            Written discharge instructions were provided to the                            patient.                           - Resume previous diet.                           - Continue present medications.                           -  Await pathology results. Remo Lipps P. Diavion Labrador, MD 09/30/2021 2:40:38 PM This report has been signed electronically.

## 2021-09-30 NOTE — Progress Notes (Signed)
[facility] History and Physical   Primary Care Physician:  Angela Reid, PA-C   Reason for Procedure:   History of colon polyps  Plan:    colonoscopy     HPI: Angela Ramsey is a 56 y.o. female  here for colonoscopy surveillance - 5 adenomas removed 07/2018. Patient denies any bowel symptoms at this time. No family history of colon cancer known. Otherwise feels well without any cardiopulmonary symptoms.   I have discussed risks / benefits of anesthesia and endoscopic procedure with [name] and they wish to proceed with the exams as outlined today.    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 Authorizing Provider  B Complex Vitamins (VITAMIN B COMPLEX PO) Take by mouth daily.   Yes [provider]  Cholecalciferol (VITAMIN D) 50 MCG (2000 UT) tablet Take 1 tablet (2,000 Units total) by mouth daily. 12/06/18  Yes Angela Ramsey, Deborah, DO  cyclobenzaprine (FLEXERIL) 5 MG tablet TAKE 1 TABLET BY MOUTH AT BEDTIME AS NEEDED FOR MUSCLE SPASMS. Patient taking differently: daily. 08/13/21  Yes Angela Harold, MD  FLUoxetine (PROZAC) 40 MG capsule TAKE 1 CAPSULE BY MOUTH EVERY DAY 09/23/21  Yes Angela Bruins, MD  Multiple Vitamins-Minerals (ALIVE MULTI-VITAMIN) CHEW Chew by mouth daily.   Yes [provider]  Omega-3 Fatty Acids (FISH OIL PO) Take 1 capsule by  mouth daily. Unsure of dosage   Yes [provider]  topiramate (TOPAMAX) 100 MG tablet Take 1 tablet (100 mg total) by mouth at bedtime. 04/01/21  Yes Angela Harold, MD  triamcinolone (KENALOG) 0.025 % ointment Apply 1 application topically at bedtime. Patient taking differently: Apply 1 application  topically as needed. 04/17/19   Angela Edwards, PA-C    Current Outpatient Medications  Medication Sig Dispense Refill   B Complex Vitamins (VITAMIN B COMPLEX PO) Take by mouth daily.     Cholecalciferol (VITAMIN D) 50 MCG (2000 UT) tablet Take 1 tablet (2,000 Units total) by mouth daily.     cyclobenzaprine (FLEXERIL) 5 MG tablet TAKE 1 TABLET BY MOUTH AT BEDTIME AS NEEDED FOR MUSCLE SPASMS. (Patient taking differently: daily.) 30 tablet 3   FLUoxetine (PROZAC) 40 MG capsule TAKE 1 CAPSULE BY MOUTH EVERY DAY 90 capsule 2   Multiple Vitamins-Minerals (ALIVE MULTI-VITAMIN) CHEW Chew by mouth daily.     Omega-3 Fatty Acids (FISH OIL PO) Take 1 capsule by mouth daily. Unsure of dosage     topiramate (TOPAMAX) 100 MG tablet Take 1 tablet (100 mg total) by mouth at bedtime. 30 tablet 11   triamcinolone (KENALOG) 0.025 % ointment Apply 1 application topically at bedtime. (Patient taking differently: Apply 1 application  topically as needed.) 30 g 0   Current Facility-Administered Medications  Medication Dose Route Frequency  Provider Last Rate Last Admin   0.9 %  sodium chloride infusion  500 mL Intravenous Continuous Angela Ramsey, Angela Raspberry, MD        Allergies as of 09/30/2021   (No Known Allergies)    Family History  Problem Relation Age of Onset   Hypertension Mother    Hypertension Father    Hyperlipidemia Father    Stroke Father    Thyroid cancer Sister        dx in her early to mid 20s   Cancer Sister        thyroid   Breast cancer Maternal Aunt 67       mother's identical twin   Cancer Maternal Aunt        breast   Breast cancer Maternal Aunt 49       second cancer at  34    Cancer Maternal Aunt        breast x 2   Breast cancer Paternal Aunt 86   Breast cancer Maternal Grandmother        dx in her late 42s to early 51s   Cancer Maternal Grandmother        breast   Cancer Maternal Grandfather        lung   Diabetes Paternal Grandmother    Leukemia Paternal Grandfather    Cancer Paternal Grandfather        leukemia   Colon cancer Neg Hx    Esophageal cancer Neg Hx    Rectal cancer Neg Hx    Stomach cancer Neg Hx    Colon polyps Neg Hx    Crohn's disease Neg Hx     Social History   Socioeconomic History   Marital status: Married    Spouse name: Angela Ramsey   Number of children: 2   Years of education: Not on file   Highest education level: High school graduate  Occupational History   Not on file  Tobacco Use   Smoking status: Never    Passive exposure: Never   Smokeless tobacco: Never  Vaping Use   Vaping Use: Never used  Substance and Sexual Activity   Alcohol use: Yes    Comment: 1 every other week or less   Drug use: No   Sexual activity: Yes    Partners: Male    Birth control/protection: Other-see comments, Post-menopausal    Comment: 1st intercourse 81 yo-5 partners- married- 73 yrs, husband vasectomy, ablation  Other Topics Concern   Not on file  Social History Narrative   Lives with husband   Caffeine- 30 oz Coke a day   Social Determinants of Health   Financial Resource Strain: Not on file  Food Insecurity: Not on file  Transportation Needs: Not on file  Physical Activity: Not on file  Stress: Not on file  Social Connections: Not on file  Intimate Partner Violence: Not on file    Review of Systems: All other review of systems negative except as mentioned in the HPI.  Physical Exam: Vital signs BP 99/79   Pulse 73   Temp (!) 96 F (35.6 C) (Temporal)   Resp 18   Ht 5' (1.524 m)   Wt 209 lb (94.8 kg)   LMP 12/19/2015   SpO2 100%   BMI 40.82 kg/m   General:   Alert,  Well-developed, pleasant and cooperative in  NAD Lungs:  Clear throughout to auscultation.   Heart:  Regular rate and rhythm Abdomen:  Soft, nontender and nondistended.  Neuro/Psych:  Alert and cooperative. Normal mood and affect. A and O x 3  Angela Mango, MD [facility]

## 2021-09-30 NOTE — Progress Notes (Signed)
Pt's states no medical or surgical changes since previsit or office visit. 

## 2021-09-30 NOTE — Progress Notes (Signed)
Approx 1430, pt started coughing and immediately spit out a mouth full of bile colored fluid.  HOB dropped to steep t-burg.  O2 turned to max.  Sedation stopped.  Suctioning started.  All this continued until pt awake enough to cough and swallow on command.  Sedation never restarted  Zofran given  Sats never dropped and BS sounded clear in PACU.  Approx 100cc fluid in suction canister

## 2021-10-01 ENCOUNTER — Telehealth: Payer: Self-pay | Admitting: *Deleted

## 2021-10-01 NOTE — Telephone Encounter (Signed)
Attempted f/u phone call. No answer. Left message. °

## 2021-10-14 ENCOUNTER — Ambulatory Visit
Admission: RE | Admit: 2021-10-14 | Discharge: 2021-10-14 | Disposition: A | Discharge status: Home / Self Care | Payer: 59 | Source: Ambulatory Visit | Attending: Obstetrics & Gynecology | Admitting: Obstetrics & Gynecology

## 2021-10-14 DIAGNOSIS — Z1231 Encounter for screening mammogram for malignant neoplasm of breast: Secondary | ICD-10-CM

## 2021-12-05 ENCOUNTER — Encounter: Payer: Self-pay | Admitting: Physician Assistant

## 2021-12-05 ENCOUNTER — Ambulatory Visit (INDEPENDENT_AMBULATORY_CARE_PROVIDER_SITE_OTHER): Payer: 59 | Admitting: Physician Assistant

## 2021-12-05 VITALS — BP 130/86 | HR 85 | Temp 97.2°F | Ht 59.0 in | Wt 209.0 lb

## 2021-12-05 DIAGNOSIS — Z1283 Encounter for screening for malignant neoplasm of skin: Secondary | ICD-10-CM

## 2021-12-05 DIAGNOSIS — B351 Tinea unguium: Secondary | ICD-10-CM | POA: Diagnosis not present

## 2021-12-05 DIAGNOSIS — L608 Other nail disorders: Secondary | ICD-10-CM

## 2021-12-05 MED ORDER — CICLOPIROX 8 % EX SOLN: Freq: Every day | CUTANEOUS | 1 refills | Status: DC

## 2021-12-05 NOTE — Progress Notes (Signed)
  Established patient acute visit   Patient: Angela Ramsey   DOB: [birth year]   56 y.o. Female  MRN: [id number] Visit Date: 12/05/2021  Chief Complaint  Patient presents with   Follow-up   Subjective    HPI  Patient presents with right great toe dark discoloration which appeared suddenly about 6 months ago and has not gone away. Patient denies injury or trauma. Non-tender and no drainage.     Medications: Outpatient Medications Prior to Visit  Medication Sig   B Complex Vitamins (VITAMIN B COMPLEX PO) Take by mouth daily.   Cholecalciferol (VITAMIN D) 50 MCG (2000 UT) tablet Take 1 tablet (2,000 Units total) by mouth daily.   cyclobenzaprine (FLEXERIL) 5 MG tablet TAKE 1 TABLET BY MOUTH AT BEDTIME AS NEEDED FOR MUSCLE SPASMS. (Patient taking differently: daily.)   FLUoxetine (PROZAC) 40 MG capsule TAKE 1 CAPSULE BY MOUTH EVERY DAY   Multiple Vitamins-Minerals (ALIVE MULTI-VITAMIN) CHEW Chew by mouth daily.   Omega-3 Fatty Acids (FISH OIL PO) Take 1 capsule by mouth daily. Unsure of dosage   topiramate (TOPAMAX) 100 MG tablet Take 1 tablet (100 mg total) by mouth at bedtime.   triamcinolone (KENALOG) 0.025 % ointment Apply 1 application topically at bedtime. (Patient taking differently: Apply 1 application  topically as needed.)   No facility-administered medications prior to visit.    Review of Systems Review of Systems:  A fourteen system review of systems was performed and found to be positive as per HPI.      Objective    BP 130/86   Pulse 85   Temp (!) 97.2 F (36.2 C) (Temporal)   Ht '4\' 11"'$  (1.499 m)   Wt 209 lb (94.8 kg)   LMP 12/19/2015   BMI 42.21 kg/m    Physical Exam  General:  Pleasant and cooperative, appropriate for stated age.  Neuro:  Alert and oriented,  extra-ocular muscles intact  HEENT:  Normocephalic, atraumatic, neck supple  Skin:  right great toe with some yellow discoloration at tip and black pigmented spot, brittle nails  noted Respiratory: Speaking in full sentences, unlabored.  Vascular:  Ext warm, no cyanosis apprec.; cap RF less 2 sec. Psych:  No HI/SI, judgement and insight good, Euthymic mood. Full Affect.   No results found for any visits on 12/05/21.  Assessment & Plan     Discussed with patient has s/sx consistent with onychomycosis so recommend starting topical anti-fungal with Penlac 8% solution. Discussed with patient due to abrupt onset of dark pigmented lesion at great toenail recommend referral to dermatology to r/o skin cancer, patient is agreeable. Will place referral.    Return if symptoms worsen or fail to improve.        Lorrene Reid, PA-C  [facility] at [facility] [phone] (phone) [phone] (fax)  [place]

## 2021-12-29 ENCOUNTER — Other Ambulatory Visit: Payer: 59

## 2022-01-05 ENCOUNTER — Encounter: Payer: 59 | Admitting: Physician Assistant

## 2022-02-03 ENCOUNTER — Other Ambulatory Visit: Payer: Self-pay

## 2022-02-03 DIAGNOSIS — E785 Hyperlipidemia, unspecified: Secondary | ICD-10-CM

## 2022-02-03 DIAGNOSIS — Z0000 Encounter for general adult medical examination without abnormal findings: Secondary | ICD-10-CM

## 2022-02-03 DIAGNOSIS — E559 Vitamin D deficiency, unspecified: Secondary | ICD-10-CM

## 2022-02-12 ENCOUNTER — Ambulatory Visit: Payer: 59 | Admitting: Obstetrics & Gynecology

## 2022-02-13 ENCOUNTER — Other Ambulatory Visit: Payer: 59

## 2022-02-19 ENCOUNTER — Ambulatory Visit: Payer: 59 | Admitting: Obstetrics & Gynecology

## 2022-02-20 ENCOUNTER — Encounter: Payer: 59 | Admitting: Nurse Practitioner

## 2022-04-17 ENCOUNTER — Other Ambulatory Visit: Payer: Self-pay | Admitting: Family Medicine

## 2022-04-17 ENCOUNTER — Ambulatory Visit (INDEPENDENT_AMBULATORY_CARE_PROVIDER_SITE_OTHER): Payer: 59 | Admitting: Family Medicine

## 2022-04-17 ENCOUNTER — Encounter: Payer: Self-pay | Admitting: Family Medicine

## 2022-04-17 VITALS — BP 125/81 | HR 79 | Resp 18 | Ht 59.0 in | Wt 206.0 lb

## 2022-04-17 DIAGNOSIS — Z808 Family history of malignant neoplasm of other organs or systems: Secondary | ICD-10-CM | POA: Diagnosis not present

## 2022-04-17 DIAGNOSIS — G43009 Migraine without aura, not intractable, without status migrainosus: Secondary | ICD-10-CM

## 2022-04-17 DIAGNOSIS — F411 Generalized anxiety disorder: Secondary | ICD-10-CM

## 2022-04-17 DIAGNOSIS — Z0000 Encounter for general adult medical examination without abnormal findings: Secondary | ICD-10-CM

## 2022-04-17 DIAGNOSIS — H6122 Impacted cerumen, left ear: Secondary | ICD-10-CM

## 2022-04-17 DIAGNOSIS — B3784 Candidal otitis externa: Secondary | ICD-10-CM

## 2022-04-17 DIAGNOSIS — E559 Vitamin D deficiency, unspecified: Secondary | ICD-10-CM | POA: Diagnosis not present

## 2022-04-17 DIAGNOSIS — E785 Hyperlipidemia, unspecified: Secondary | ICD-10-CM

## 2022-04-17 MED ORDER — CLOTRIMAZOLE 1 % EX SOLN: Freq: Two times a day (BID) | CUTANEOUS | 0 refills | Status: DC

## 2022-04-17 NOTE — Assessment & Plan Note (Signed)
Testing TSH today.  Will continue to monitor.

## 2022-04-17 NOTE — Assessment & Plan Note (Signed)
Drawing fasting lipid panel today.  Continue with diet and lifestyle intervention, continue fish oil supplement.  Will adjust management as needed according to lipid panel results.

## 2022-04-17 NOTE — Assessment & Plan Note (Signed)
As she would like to lose weight, we discussed some of the dietary changes and lifestyle changes to support this goal.  Encouraged her to continue a low-salt diet but consider to also limits sugars and unhealthy fats.  Encouraged to start a regular exercise routine.  We did discuss that there may be other options to further support her goals such as the healthy weight and wellness clinic and/or medication if she does want additional help in the future.  Patient expressed understanding, will continue to monitor.

## 2022-04-17 NOTE — Assessment & Plan Note (Addendum)
Stable.  Continue Prozac 40 mg daily, prescribed by OB/GYN.  Will continue to monitor.

## 2022-04-17 NOTE — Assessment & Plan Note (Signed)
Testing vitamin D level today.  Continue vitamin D 2000 units daily unless lab results warrant otherwise.

## 2022-04-17 NOTE — Patient Instructions (Signed)
If ear continues to hurt after finishing 2 weeks of the drops I prescribed, please let me know and we will try an oral medication.  It was nice to meet you today!

## 2022-04-17 NOTE — Progress Notes (Signed)
Complete physical exam  Patient: Angela Ramsey   DOB: July 24, 1965   57 y.o. Female  MRN: [id number]  Subjective:    Chief Complaint  Patient presents with   Annual Exam    Fasting    Angela Ramsey is a 57 y.o. female who presents today for a complete physical exam. She reports consuming a general and low sodium diet. The patient does not participate in regular exercise at present.  She would like to start walking more again as the weather gets better.  She generally feels well. She reports sleeping well. She does not have additional problems to discuss today.  Her goals for the next year are to lose weight and to continue not to have headaches now that they figured out that it was related to jaw tension.   Most recent fall risk assessment:    04/17/2022    8:17 AM  [place] in the past year? 0  Number falls in past yr: 0  Injury with Fall? 0     Most recent depression screenings:    04/17/2022    8:16 AM 12/05/2021    8:15 AM  PHQ 2/9 Scores  PHQ - 2 Score 0 0  PHQ- 9 Score  2    Patient Active Problem List   Diagnosis Date Noted   Migraine without aura and without status migrainosus, not intractable 07/01/2021   Temporal headache 12/02/2020   Jaw pain 12/02/2020   Vitamin D insufficiency 05/02/2019   Insomnia 05/02/2019   obesity ([place]) 05/02/2019   Eczema 05/02/2019   Unspecified menopausal and perimenopausal disorder 05/18/2018   Hyperlipidemia 05/18/2018   Family history of breast cancer 04/26/2018   mutation of ATM gene 04/26/2018   Hemorrhoids- small external nonthrombosed hemorrhoid perirectal area at 10 PM 02/24/2016   Environmental and seasonal allergies 12/01/2015   BMI 37.0-37.9, adult 10/16/2015   GAD (generalized anxiety disorder) 10/16/2015   Family history of Angela CAD 10/16/2015   Family history of thyroid cancer    h/o ovarian Endometriosis     Past Surgical History:  Procedure Laterality Date   BREAST BIOPSY Left 2016    BREAST SURGERY     Breast Bx benign   COLONOSCOPY     FOOT SURGERY Left    plantar fasciitis   NOVASURE ABLATION  10/2004   PELVIC LAPAROSCOPY  12/2002   endometriosis   POLYPECTOMY     Social History   Tobacco Use   Smoking status: Never    Passive exposure: Never   Smokeless tobacco: Never  Vaping Use   Vaping Use: Never used  Substance Use Topics   Alcohol use: Yes    Comment: 1 every other week or less   Drug use: No   Family History  Problem Relation Age of Onset   Hypertension Mother    Hypertension Father    Hyperlipidemia Father    Stroke Father    Thyroid cancer Sister        dx in her Angela to mid 57s   Cancer Sister        thyroid   Breast cancer Maternal Aunt 67       mother's identical twin   Cancer Maternal Aunt        breast   Breast cancer Maternal Aunt 49       second cancer at  31   Cancer Maternal Aunt        breast x 2   Breast  cancer Paternal Aunt 59   Breast cancer Maternal Grandmother        dx in her late 50s to Angela 30s   Cancer Maternal Grandmother        breast   Cancer Maternal Grandfather        lung   Diabetes Paternal Grandmother    Leukemia Paternal Grandfather    Cancer Paternal Grandfather        leukemia   Colon cancer Neg Hx    Esophageal cancer Neg Hx    Rectal cancer Neg Hx    Stomach cancer Neg Hx    Colon polyps Neg Hx    Crohn's disease Neg Hx    No Known Allergies   Patient Care Team: Angela Harman, PA as PCP - General (Family Medicine) Angela Osmond, MD as PCP - Cardiology (Cardiology) Angela Kenner, MD (Dermatology) Angela Bruins, MD as Consulting Physician (Obstetrics and Gynecology)   Outpatient Medications Prior to Visit  Medication Sig   B Complex Vitamins (VITAMIN B COMPLEX PO) Take by mouth daily.   Cholecalciferol (VITAMIN D) 50 MCG (2000 UT) tablet Take 1 tablet (2,000 Units total) by mouth daily.   FLUoxetine (PROZAC) 40 MG capsule TAKE 1 CAPSULE BY MOUTH EVERY DAY   Multiple  Vitamins-Minerals (ALIVE MULTI-VITAMIN) CHEW Chew by mouth daily.   Omega-3 Fatty Acids (FISH OIL PO) Take 1 capsule by mouth daily. Unsure of dosage   triamcinolone (KENALOG) 0.025 % ointment Apply 1 application topically at bedtime. (Patient taking differently: Apply 1 application  topically as needed.)   [DISCONTINUED] cyclobenzaprine (FLEXERIL) 5 MG tablet TAKE 1 TABLET BY MOUTH AT BEDTIME AS NEEDED FOR MUSCLE SPASMS. (Patient taking differently: daily.)   [DISCONTINUED] topiramate (TOPAMAX) 100 MG tablet Take 1 tablet (100 mg total) by mouth at bedtime.   [DISCONTINUED] ciclopirox (PENLAC) 8 % solution Apply topically at bedtime. Apply over nail and surrounding skin. Apply daily over previous coat. After seven (7) days, may remove with alcohol and continue cycle.   No facility-administered medications prior to visit.    Review of Systems  Constitutional:  Negative for chills, fever and malaise/fatigue.  HENT:  Positive for ear pain (Left). Negative for congestion and hearing loss.   Eyes:  Negative for blurred vision and double vision.  Respiratory:  Negative for cough and shortness of breath.   Cardiovascular:  Negative for chest pain, palpitations and leg swelling.  Gastrointestinal:  Negative for abdominal pain, constipation, diarrhea and heartburn.  Genitourinary:  Negative for frequency and urgency.  Musculoskeletal:  Negative for myalgias and neck pain.  Neurological:  Negative for headaches (Resolved after physical therapy for jaw).  Endo/Heme/Allergies:  Negative for polydipsia.  Psychiatric/Behavioral:  Negative for depression. The patient is not nervous/anxious.        Objective:    BP 125/81 (BP Location: Left Arm, Patient Position: Sitting, Cuff Size: Large)   Pulse 79   Resp 18   Ht '4\' 11"'$  (1.499 m)   Wt 206 lb (93.4 kg)   LMP 12/19/2015   SpO2 96%   BMI 41.61 kg/m    Physical Exam Constitutional:      General: She is not in acute distress.    Appearance:  Normal appearance.  HENT:     Head: Normocephalic and atraumatic.     Right Ear: Hearing, tympanic membrane, ear canal and external ear normal. No drainage or tenderness.     Left Ear: Hearing normal. Tenderness (Tragus, entrance to auditory canal) present. No drainage.  There is impacted cerumen. No mastoid tenderness.     Ears:     Comments: Thick white discharge on external canal and tympanic membrane surface.  Minimal erythema of external canal.    Nose: Nose normal.     Mouth/Throat:     Mouth: Mucous membranes are moist.     Pharynx: No oropharyngeal exudate or posterior oropharyngeal erythema.  Eyes:     Extraocular Movements: Extraocular movements intact.     Pupils: Pupils are equal, round, and reactive to light.  Neck:     Thyroid: No thyroid mass.     Trachea: Trachea normal.  Cardiovascular:     Rate and Rhythm: Normal rate and regular rhythm.     Heart sounds: Normal heart sounds.  Pulmonary:     Effort: Pulmonary effort is normal.     Breath sounds: Normal breath sounds.  Abdominal:     General: Abdomen is flat. Bowel sounds are normal.     Tenderness: There is no abdominal tenderness. There is no guarding.  Musculoskeletal:        General: Normal range of motion.     Cervical back: Normal range of motion and neck supple. No muscular tenderness.  Lymphadenopathy:     Cervical: No cervical adenopathy.  Skin:    General: Skin is warm and dry.  Neurological:     Mental Status: She is alert and oriented to person, place, and time.     Cranial Nerves: No cranial nerve deficit.     Deep Tendon Reflexes: Reflexes normal.  Psychiatric:        Mood and Affect: Mood normal.       Assessment & Plan:    Routine Health Maintenance and Physical Exam  Immunization History  Administered Date(s) Administered   Influenza,inj,Quad PF,6+ Mos 12/30/2015, 12/05/2018   Tdap 12/30/2015   Zoster Recombinat (Shingrix) 12/05/2018, 03/03/2019    Health Maintenance  Topic Date  Due   PAP SMEAR-Modifier  01/02/2022   COVID-19 Vaccine (1) 05/03/2022 (Originally 10/27/1965)   INFLUENZA VACCINE  05/17/2022 (Originally 09/16/2021)   MAMMOGRAM  10/15/2022   DTaP/Tdap/Td (2 - Td or Tdap) 12/29/2025   COLONOSCOPY (Pts 45-20yr Insurance coverage will need to be confirmed)  10/01/2026   Hepatitis C Screening  Completed   HIV Screening  Completed   Zoster Vaccines- Shingrix  Completed   HPV VACCINES  Aged Out    Discussed health benefits of physical activity, and encouraged her to engage in regular exercise appropriate for her age and condition.  Wellness examination -     CBC with Differential/Platelet; Future -     Comprehensive metabolic panel; Future -     Hemoglobin A1c; Future -     Lipid panel; Future -     VITAMIN D 25 Hydroxy (Vit-D Deficiency, Fractures); Future -     TSH; Future  Vitamin D insufficiency Assessment & Plan: Testing vitamin D level today.  Continue vitamin D 2000 units daily unless lab results warrant otherwise.  Orders: -     VITAMIN D 25 Hydroxy (Vit-D Deficiency, Fractures); Future  obesity ([place] Assessment & Plan: As she would like to lose weight, we discussed some of the dietary changes and lifestyle changes to support this goal.  Encouraged her to continue a low-salt diet but consider to also limits sugars and unhealthy fats.  Encouraged to start a regular exercise routine.  We did discuss that there may be other options to further support her goals such as the healthy weight  and wellness clinic and/or medication if she does want additional help in the future.  Patient expressed understanding, will continue to monitor.  Orders: -     CBC with Differential/Platelet; Future -     Comprehensive metabolic panel; Future -     Lipid panel; Future  Family history of thyroid cancer Assessment & Plan: Testing TSH today.  Will continue to monitor.  Orders: -     TSH; Future  Hyperlipidemia, unspecified hyperlipidemia type Assessment &  Plan: Drawing fasting lipid panel today.  Continue with diet and lifestyle intervention, continue fish oil supplement.  Will adjust management as needed according to lipid panel results.  Orders: -     Lipid panel; Future  GAD (generalized anxiety disorder) Assessment & Plan: Stable.  Continue Prozac 40 mg daily, prescribed by OB/GYN.  Will continue to monitor.   Migraine without aura and without status migrainosus, not intractable Assessment & Plan: Previously followed by neurology, but she has discontinued the topiramate 100 mg as well as regular appointments with neurology.  It was found that headaches were related to jaw tension which she went to physical therapy for and the issue has since resolved.  Will continue to monitor.  Encouraged to continue with PT exercises as needed should she ever notice increased tension in her jaw.   Impacted cerumen of left ear -     Ear Lavage  Otitis externa, candida -     Clotrimazole; Apply topically 2 (two) times daily. For 10 to 14 days.  Dispense: 60 mL; Refill: 0  She is seeing OB/GYN in the next few months, and they take care of her Pap smear and orders for mammogram and breast MRI.  Drawing routine lab work today.  Otherwise, she is up-to-date on preventative care.  Return in about 6 months (around 10/18/2022) for follow-up, fasting blood work 1 week before.   Angela Harman, PA

## 2022-04-17 NOTE — Assessment & Plan Note (Addendum)
Previously followed by neurology, but she has discontinued the topiramate 100 mg as well as regular appointments with neurology.  It was found that headaches were related to jaw tension which she went to physical therapy for and the issue has since resolved.  Will continue to monitor.  Encouraged to continue with PT exercises as needed should she ever notice increased tension in her jaw.

## 2022-04-18 LAB — CBC WITH DIFFERENTIAL/PLATELET
Basophils Absolute: 0.1 10*3/uL (ref 0.0–0.2)
Basos: 1 %
EOS (ABSOLUTE): 0.2 10*3/uL (ref 0.0–0.4)
Eos: 2 %
Hematocrit: 42.4 % (ref 34.0–46.6)
Hemoglobin: 13.6 g/dL (ref 11.1–15.9)
Immature Grans (Abs): 0 10*3/uL (ref 0.0–0.1)
Immature Granulocytes: 0 %
Lymphocytes Absolute: 3.2 10*3/uL — ABNORMAL HIGH (ref 0.7–3.1)
Lymphs: 34 %
MCH: 28.4 pg (ref 26.6–33.0)
MCHC: 32.1 g/dL (ref 31.5–35.7)
MCV: 89 fL (ref 79–97)
Monocytes Absolute: 0.5 10*3/uL (ref 0.1–0.9)
Monocytes: 5 %
Neutrophils Absolute: 5.5 10*3/uL (ref 1.4–7.0)
Neutrophils: 58 %
Platelets: 360 10*3/uL (ref 150–450)
RBC: 4.79 x10E6/uL (ref 3.77–5.28)
RDW: 12.6 % (ref 11.7–15.4)
WBC: 9.3 10*3/uL (ref 3.4–10.8)

## 2022-04-18 LAB — COMPREHENSIVE METABOLIC PANEL
ALT: 26 IU/L (ref 0–32)
AST: 29 IU/L (ref 0–40)
Albumin/Globulin Ratio: 1.8 (ref 1.2–2.2)
Albumin: 4.6 g/dL (ref 3.8–4.9)
Alkaline Phosphatase: 112 IU/L (ref 44–121)
BUN/Creatinine Ratio: 24 — ABNORMAL HIGH (ref 9–23)
BUN: 17 mg/dL (ref 6–24)
Bilirubin Total: 0.4 mg/dL (ref 0.0–1.2)
CO2: 22 mmol/L (ref 20–29)
Calcium: 9.5 mg/dL (ref 8.7–10.2)
Chloride: 104 mmol/L (ref 96–106)
Creatinine, Ser: 0.72 mg/dL (ref 0.57–1.00)
Globulin, Total: 2.6 g/dL (ref 1.5–4.5)
Glucose: 98 mg/dL (ref 70–99)
Potassium: 4.3 mmol/L (ref 3.5–5.2)
Sodium: 142 mmol/L (ref 134–144)
Total Protein: 7.2 g/dL (ref 6.0–8.5)
eGFR: 99 mL/min/{1.73_m2} (ref >59)

## 2022-04-18 LAB — LIPID PANEL
Chol/HDL Ratio: 3.5 ratio (ref 0.0–4.4)
Cholesterol, Total: 246 mg/dL — ABNORMAL HIGH (ref 100–199)
HDL: 71 mg/dL (ref >39)
LDL Chol Calc (NIH): 160 mg/dL — ABNORMAL HIGH (ref 0–99)
Triglycerides: 89 mg/dL (ref 0–149)
VLDL Cholesterol Cal: 15 mg/dL (ref 5–40)

## 2022-04-18 LAB — TSH: TSH: 2.49 u[IU]/mL (ref 0.450–4.500)

## 2022-04-18 LAB — HEMOGLOBIN A1C
Est. average glucose Bld gHb Est-mCnc: 111 mg/dL
Hgb A1c MFr Bld: 5.5 % (ref 4.8–5.6)

## 2022-04-18 LAB — VITAMIN D 25 HYDROXY (VIT D DEFICIENCY, FRACTURES): Vit D, 25-Hydroxy: 51.2 ng/mL (ref 30.0–100.0)

## 2022-04-20 ENCOUNTER — Encounter (INDEPENDENT_AMBULATORY_CARE_PROVIDER_SITE_OTHER): Payer: 59 | Admitting: Family Medicine

## 2022-04-20 DIAGNOSIS — B3784 Candidal otitis externa: Secondary | ICD-10-CM

## 2022-04-20 MED ORDER — CLOTRIMAZOLE 1 % EX SOLN: CUTANEOUS | 1 refills | Status: DC

## 2022-04-21 NOTE — Telephone Encounter (Signed)

## 2022-05-01 ENCOUNTER — Other Ambulatory Visit: Payer: Self-pay | Admitting: Psychiatry

## 2022-05-01 NOTE — Telephone Encounter (Signed)
Pt was seen on 04/07/21 No follow up scheduled

## 2022-06-04 ENCOUNTER — Ambulatory Visit (INDEPENDENT_AMBULATORY_CARE_PROVIDER_SITE_OTHER): Payer: 59 | Admitting: Obstetrics & Gynecology

## 2022-06-04 ENCOUNTER — Encounter: Payer: Self-pay | Admitting: Obstetrics & Gynecology

## 2022-06-04 ENCOUNTER — Other Ambulatory Visit (HOSPITAL_COMMUNITY)
Admission: RE | Admit: 2022-06-04 | Discharge: 2022-06-04 | Disposition: A | Discharge status: Home / Self Care | Payer: 59 | Source: Ambulatory Visit | Attending: Obstetrics & Gynecology | Admitting: Obstetrics & Gynecology

## 2022-06-04 VITALS — BP 110/74 | HR 81 | Ht 59.75 in | Wt 211.0 lb

## 2022-06-04 DIAGNOSIS — F341 Dysthymic disorder: Secondary | ICD-10-CM

## 2022-06-04 DIAGNOSIS — Z803 Family history of malignant neoplasm of breast: Secondary | ICD-10-CM | POA: Diagnosis not present

## 2022-06-04 DIAGNOSIS — Z01419 Encounter for gynecological examination (general) (routine) without abnormal findings: Secondary | ICD-10-CM | POA: Diagnosis not present

## 2022-06-04 DIAGNOSIS — Z780 Asymptomatic menopausal state: Secondary | ICD-10-CM | POA: Diagnosis not present

## 2022-06-04 MED ORDER — FLUOXETINE HCL 40 MG PO CAPS
40.0000 mg | ORAL_CAPSULE | Freq: Every day | ORAL | 4 refills | Status: DC
Start: 2022-06-04 — End: 2022-08-06

## 2022-06-04 NOTE — Progress Notes (Signed)
Angela Ramsey Mar 02, 1965 [id number]   History:    57 y.o.  G2P2L2 Married.  Son coming for Christmas with his family.  Daughter is pregnant with twins.   RP: Established patient presenting for annual gynecologic exam   HPI: Postmenopausal, well on no HRT.  S/P Endometrial Ablation. No pelvic pain, occasional cramping.  Pap negative 12/2019. Pap Reflex today.  Breasts normal.  Mammo Neg 09/2021.  Strong fam h/o Breast Ca, patient does MRI of breasts annually.  [place] 09/2021. Health labs with Fam MD.  BMI 41.55.  Depression stable on Prozac.   Past medical history,surgical history, family history and social history were all reviewed and documented in the EPIC chart.  Gynecologic History Patient's last menstrual period was 12/19/2015.  Obstetric History OB History  Gravida Para Term Preterm AB Living  SAB IAB Ectopic Multiple Live Births               # Outcome Date GA Lbr Len/2nd Weight Sex Delivery Anes PTL Lv  2 Term           1 Term              ROS: A ROS was performed and pertinent positives and negatives are included in the history. GENERAL: No fevers or chills. HEENT: No change in vision, no earache, sore throat or sinus congestion. NECK: No pain or stiffness. CARDIOVASCULAR: No chest pain or pressure. No palpitations. PULMONARY: No shortness of breath, cough or wheeze. GASTROINTESTINAL: No abdominal pain, nausea, vomiting or diarrhea, melena or bright red blood per rectum. GENITOURINARY: No urinary frequency, urgency, hesitancy or dysuria. MUSCULOSKELETAL: No joint or muscle pain, no back pain, no recent trauma. DERMATOLOGIC: No rash, no itching, no lesions. ENDOCRINE: No polyuria, polydipsia, no heat or cold intolerance. No recent change in weight. HEMATOLOGICAL: No anemia or easy bruising or bleeding. NEUROLOGIC: No headache, seizures, numbness, tingling or weakness. PSYCHIATRIC: No depression, no loss of interest in normal activity or change in sleep  pattern.     Exam:   BP 110/74   Pulse 81   Ht 4' 11.75" (1.518 m)   Wt 211 lb (95.7 kg)   LMP 12/19/2015 Comment: sexually active  SpO2 97%   BMI 41.55 kg/m   Body mass index is 41.55 kg/m.  General appearance : Well developed well nourished female. No acute distress HEENT: Eyes: no retinal hemorrhage or exudates,  Neck supple, trachea midline, no carotid bruits, no thyroidmegaly Lungs: Clear to auscultation, no rhonchi or wheezes, or rib retractions  Heart: Regular rate and rhythm, no murmurs or gallops Breast:Examined in sitting and supine position were symmetrical in appearance, no palpable masses or tenderness,  no skin retraction, no nipple inversion, no nipple discharge, no skin discoloration, no axillary or supraclavicular lymphadenopathy Abdomen: no palpable masses or tenderness, no rebound or guarding Extremities: no edema or skin discoloration or tenderness  Pelvic: Vulva: Normal             Vagina: No gross lesions or discharge  Cervix: No gross lesions or discharge.  Pap reflex done.  Uterus  RV, normal size, shape and consistency, non-tender and mobile  Adnexa  Without masses or tenderness  Anus: Normal   Assessment/Plan:  57 y.o. female for annual exam   1. Encounter for routine gynecological examination with Papanicolaou smear of cervix Postmenopausal, well on no HRT.  S/P Endometrial Ablation. No pelvic pain, occasional cramping.  Pap negative 12/2019. Pap  Reflex today.  Breasts normal.  Mammo Neg 09/2021.  Strong fam h/o Breast Ca, patient does MRI of breasts annually.  [place] 09/2021. Health labs with Fam MD.  BMI 41.55.  Depression stable on Prozac. - Cytology - PAP( [place])  2. Postmenopause Postmenopausal, well on no HRT.  S/P Endometrial Ablation. No pelvic pain, occasional cramping.   3. Family history of breast cancer Breasts normal.  Mammo Neg 09/2021.  Strong fam h/o Breast Ca, patient does MRI of breasts annually.   4. Persistent depressive  disorder Depression stable on Prozac. Prescription sent to pharmacy.  Other orders - FLUoxetine (PROZAC) 40 MG capsule; Take 1 capsule (40 mg total) by mouth daily.   Genia Del MD, 2:33 PM

## 2022-06-05 ENCOUNTER — Telehealth: Payer: Self-pay

## 2022-06-05 DIAGNOSIS — Z803 Family history of malignant neoplasm of breast: Secondary | ICD-10-CM

## 2022-06-05 NOTE — Telephone Encounter (Signed)
-----   Message from Genia Del, MD sent at 06/04/2022  2:50 PM EDT ----- Regarding: Annual Bilateral Breast MRI Strong Family h/o Breast Ca.  Patient does MRI of Breasts every year.  Please order.

## 2022-06-05 NOTE — Telephone Encounter (Signed)
MRI order placed. [facility] [facility] to reach out to schedule.

## 2022-06-10 LAB — CYTOLOGY - PAP: Diagnosis: NEGATIVE

## 2022-06-24 NOTE — Telephone Encounter (Signed)
Pt scheduled for MRI on 06/26/2022. Per EMR: Authorization is completed. Will route to provider for final review and close encounter.

## 2022-06-26 ENCOUNTER — Ambulatory Visit
Admission: RE | Admit: 2022-06-26 | Discharge: 2022-06-26 | Disposition: A | Discharge status: Home / Self Care | Payer: 59 | Source: Ambulatory Visit | Attending: Obstetrics & Gynecology | Admitting: Obstetrics & Gynecology

## 2022-06-26 DIAGNOSIS — Z803 Family history of malignant neoplasm of breast: Secondary | ICD-10-CM

## 2022-06-26 MED ORDER — GADOPICLENOL 0.5 MMOL/ML IV SOLN
10.0000 mL | Freq: Once | INTRAVENOUS | Status: AC | PRN
  Administered 2022-06-26: 10 mL via INTRAVENOUS

## 2022-07-06 ENCOUNTER — Encounter: Payer: Self-pay | Admitting: Obstetrics & Gynecology

## 2022-07-07 NOTE — Telephone Encounter (Signed)
Per ML: "Please ask Radiologist to review and consider aspiration of the Rt breast small cyst.   Dr. Seymour Bars"   Msg sent to Dr. Azucena Kuba regarding Dr. Sharol Roussel request.

## 2022-07-09 NOTE — Telephone Encounter (Signed)
Had not yet received a response from Dr. Azucena Kuba so called the [facility] Reception desk and spoke w/ Tammy Sours and had him get the message/addendum request to Dr. Azucena Kuba for consideration.

## 2022-07-09 NOTE — Telephone Encounter (Signed)
Addendum made to MRI results. Please review and advise at your earliest convenience. Thanks.

## 2022-08-06 ENCOUNTER — Telehealth: Payer: Self-pay

## 2022-08-06 ENCOUNTER — Ambulatory Visit (INDEPENDENT_AMBULATORY_CARE_PROVIDER_SITE_OTHER): Payer: 59 | Admitting: Family Medicine

## 2022-08-06 ENCOUNTER — Encounter: Payer: Self-pay | Admitting: Family Medicine

## 2022-08-06 VITALS — BP 148/91 | HR 60 | Resp 18 | Ht 59.75 in | Wt 214.0 lb

## 2022-08-06 DIAGNOSIS — L02212 Cutaneous abscess of back [any part, except buttock]: Secondary | ICD-10-CM

## 2022-08-06 DIAGNOSIS — F411 Generalized anxiety disorder: Secondary | ICD-10-CM

## 2022-08-06 MED ORDER — FLUOXETINE HCL 20 MG PO TABS: 20.0000 mg | ORAL_TABLET | Freq: Every day | ORAL | 0 refills | Status: DC

## 2022-08-06 MED ORDER — SULFAMETHOXAZOLE-TRIMETHOPRIM 800-160 MG PO TABS: 2.0000 | ORAL_TABLET | Freq: Two times a day (BID) | ORAL | 0 refills | Status: AC

## 2022-08-06 MED ORDER — FLUOXETINE HCL 10 MG PO TABS: 10.0000 mg | ORAL_TABLET | Freq: Every day | ORAL | 0 refills | Status: DC

## 2022-08-06 NOTE — Progress Notes (Signed)
Acute Office Visit  Subjective:     Patient ID: Nachole Hebron, female    DOB: [birth year], 57 y.o.   MRN: [id number]  Chief Complaint  Patient presents with   Cyst    Upper back     HPI Patient is in today for a cyst on her upper back.  She initially noticed the cyst in February or March, went to dermatology about a week after her annual physical on 04/17/2022.  At that time, there was tenderness, drainage, and two openings.  Dermatology diagnosed her with an infectious cyst and initiated 14 days of doxycycline.  After no improvement, recommended second course of 14 days of doxycycline.  She returned for an office visit at that time, and dermatology performed an office procedure to drain the excision.  They then put her on another course of doxycycline, which she finished last week.  She continues to have tenderness and drainage in the area.  She has been cleaning it and replacing bandages daily.  Denies fever, chills, diaphoresis.  ROS Negative unless otherwise noted in HPI    Objective:    BP (!) 148/91 (BP Location: Left Arm, Patient Position: Sitting, Cuff Size: Large)   Pulse 60   Resp 18   Ht 4' 11.75" (1.518 m)   Wt 214 lb (97.1 kg)   LMP 12/19/2015 Comment: sexually active  SpO2 97%   BMI 42.14 kg/m   Physical Exam Constitutional:      General: She is not in acute distress.    Appearance: Normal appearance.  HENT:     Head: Normocephalic and atraumatic.  Pulmonary:     Effort: Pulmonary effort is normal. No respiratory distress.  Musculoskeletal:     Cervical back: Normal range of motion.  Skin:    General: Skin is warm and dry.     Findings: Abscess (Very tender to palpation, abscess with 2 openings, thick white drainage from both openings) present. No bruising, erythema or rash.          Comments: Patient states that initially the lower opening was 2 separate holes, dermatology created an incision between them to make 1 larger hole during the initial  drainage procedure  Neurological:     General: No focal deficit present.     Mental Status: She is alert and oriented to person, place, and time. Mental status is at baseline.  Psychiatric:        Mood and Affect: Mood normal.        Thought Content: Thought content normal.        Judgment: Judgment normal.   Diagnosis: abscess - Location: upper back Procedure: Incision & drainage Informed consent:  Discussed risks (infection, pain, bleeding, bruising, numbness, scarring, and recurrence of the condition) and benefits of the procedure, as well as the alternatives.  Informed consent was obtained verbally. Anesthesia: 1% lidocaine 10 mL The area was prepared and draped in a standard fashion.  Injected 1% lidocaine, waited 60 seconds, created incision between the 2 openings as a physical exam suggested buildup of infectious material beneath the skin between the 2 openings.  Upon incision, discovered that the lesion was multiloculated.  2 separate cavities were flushed with 10% normal saline, then hydrogen peroxide.  Wound openings were left open to heal by second intention, 2 sutures were placed in the incision site between the 2 openings.  Minimal blood loss, the patient tolerated the procedure well.  Attempted to collect culture, will send for testing. The patient was  instructed on post-op care including when to return for suture removal.  Instructed to keep the area clean and bandaged, follow-up if concerns about spreading erythema/pain.     Assessment & Plan:  Abscess of back -     Sulfamethoxazole-Trimethoprim; Take 2 tablets by mouth 2 (two) times daily for 14 days. Start for 7 days, if not resolved then finish full 14 day course.  Dispense: 56 tablet; Refill: 0 -     Ambulatory referral to General Surgery -     WOUND CULTURE  Performed incision and drainage of abscess as patient needed more immediate relief.  Applied topical antibiotic ointment when dressing the wound after the procedure and  initiating course of Bactrim.  Instructed patient to start with 7-day course, when she returns for suture removal in 1 week we will evaluate if she needs to finish the full 14-day course.  I suspect that the bacteria causing the infection may not have been susceptible to doxycycline.  I will adjust antibiotic regimen as needed based on culture results.  Additionally, we discussed a referral to general surgery for permanent management as it will require general anesthesia to fully remove the cyst wall if that is the option that she wants to pursue.  She is agreeable to referral for evaluation and to discuss her options.  We discussed wound and suture care including signs of infection.  I also provided educational handouts about wound and suture care.  Return in about 1 week (around 08/13/2022) for suture removal and wound check.  Melida Quitter, PA

## 2022-08-06 NOTE — Telephone Encounter (Signed)
Patient did not see the instructions for tapering off Prozac and request that instructions be sent to her via Mychart.

## 2022-08-07 LAB — WOUND CULTURE

## 2022-08-07 LAB — SPECIMEN STATUS REPORT

## 2022-08-10 ENCOUNTER — Encounter: Payer: Self-pay | Admitting: Family Medicine

## 2022-08-13 ENCOUNTER — Encounter: Payer: Self-pay | Admitting: Family Medicine

## 2022-08-13 ENCOUNTER — Ambulatory Visit (INDEPENDENT_AMBULATORY_CARE_PROVIDER_SITE_OTHER): Payer: 59 | Admitting: Family Medicine

## 2022-08-13 VITALS — BP 133/89 | HR 72 | Resp 18 | Ht 59.75 in | Wt 214.0 lb

## 2022-08-13 DIAGNOSIS — L02212 Cutaneous abscess of back [any part, except buttock]: Secondary | ICD-10-CM

## 2022-08-13 NOTE — Progress Notes (Signed)
   Established Patient Office Visit  Subjective   Patient ID: Angela Ramsey, female    DOB: 12/26/1965  Age: 57 y.o. MRN: [id number]  Chief Complaint  Patient presents with   Suture / Staple Removal    HPI Angela Ramsey is a 57 y.o. female presenting today for follow up of back abscess and suture removal.  At last appointment, incision and drainage was performed and course of Bactrim was initiated after failing a 6-week course of doxycycline.  There was not enough material for the wound culture to be run.  She states that it is still expressing purulent drainage when she changes the wound dressing at home.  She has been taking the Bactrim as prescribed.  ROS Negative unless otherwise noted in HPI   Objective:     BP 133/89 (BP Location: Left Arm, Patient Position: Sitting, Cuff Size: Large)   Pulse 72   Resp 18   Ht 4' 11.75" (1.518 m)   Wt 214 lb (97.1 kg)   LMP 12/19/2015 Comment: sexually active  SpO2 97%   BMI 42.14 kg/m   Physical Exam Constitutional:      General: She is not in acute distress.    Appearance: Normal appearance.  HENT:     Head: Normocephalic and atraumatic.  Pulmonary:     Effort: Pulmonary effort is normal. No respiratory distress.  Musculoskeletal:     Cervical back: Normal range of motion.  Skin:    General: Skin is warm and dry.     Findings: Abscess present.     Comments: Well-healing abscess on upper back.  Still to drainage holes, purulent drainage present at both.  2 sutures still in place.  No surrounding erythema or warmth.  Area is not tender to palpation.  Neurological:     General: No focal deficit present.     Mental Status: She is alert and oriented to person, place, and time. Mental status is at baseline.  Psychiatric:        Mood and Affect: Mood normal.        Thought Content: Thought content normal.        Judgment: Judgment normal.     Assessment & Plan:  Abscess of back -     WOUND CULTURE  Removed  sutures, incision is healing nicely.  Firm scar tissue still palpable under where sutures were removed.  Collected culture of abscess drainage today to resend.  Until results come back, recommend to continue Bactrim 2 tablets twice daily for an additional 7 days.  At that time, can reevaluate whether to continue with an additional week of a lower dose of Bactrim or general surgery may be able to get her in sooner.  Patient verbalized understanding and is agreeable to this plan.  Return if symptoms worsen or fail to improve.    Melida Quitter, PA

## 2022-08-13 NOTE — Patient Instructions (Signed)
Let me know if you need to come back in about a week or if general surgery can see you before then!

## 2022-08-14 LAB — WOUND CULTURE

## 2022-08-19 ENCOUNTER — Telehealth: Payer: Self-pay | Admitting: *Deleted

## 2022-08-19 NOTE — Telephone Encounter (Signed)
Pt returned call and she did hear back from them.

## 2022-08-19 NOTE — Telephone Encounter (Signed)
LVM to call office to see if she ever heard from [facility] surgery.

## 2022-08-24 ENCOUNTER — Telehealth: Payer: Self-pay | Admitting: *Deleted

## 2022-08-24 ENCOUNTER — Other Ambulatory Visit: Payer: Self-pay | Admitting: Family Medicine

## 2022-08-24 DIAGNOSIS — F411 Generalized anxiety disorder: Secondary | ICD-10-CM

## 2022-08-24 NOTE — Telephone Encounter (Signed)
Contacted pharmacy and they said that they were able to get it to go through.

## 2022-08-24 NOTE — Telephone Encounter (Signed)
[facility] calling requesting a change in the Rx for Fluoxetine.  He states that patients insurance covers the capsules and NOT the tablets.  Please resend the Rx for capsules.

## 2022-08-24 NOTE — Telephone Encounter (Signed)
Patient is currently tapering off of fluoxetine, prescriptions for the 20 mg tablets and 10 mg tablets will be her last ones.  She needed tablets in order to complete the taper by cutting pills in half.

## 2022-08-25 ENCOUNTER — Ambulatory Visit: Payer: 59 | Admitting: Dermatology

## 2022-08-28 ENCOUNTER — Other Ambulatory Visit: Payer: Self-pay | Admitting: Family Medicine

## 2022-08-28 DIAGNOSIS — F411 Generalized anxiety disorder: Secondary | ICD-10-CM

## 2022-09-02 ENCOUNTER — Other Ambulatory Visit: Payer: Self-pay | Admitting: Family Medicine

## 2022-09-02 DIAGNOSIS — Z1231 Encounter for screening mammogram for malignant neoplasm of breast: Secondary | ICD-10-CM

## 2022-09-29 ENCOUNTER — Other Ambulatory Visit: Payer: Self-pay | Admitting: Surgery

## 2022-10-13 ENCOUNTER — Other Ambulatory Visit: Payer: Self-pay | Admitting: Family Medicine

## 2022-10-13 ENCOUNTER — Other Ambulatory Visit: Payer: 59

## 2022-10-13 DIAGNOSIS — D72829 Elevated white blood cell count, unspecified: Secondary | ICD-10-CM

## 2022-10-13 DIAGNOSIS — E785 Hyperlipidemia, unspecified: Secondary | ICD-10-CM

## 2022-10-13 DIAGNOSIS — R7989 Other specified abnormal findings of blood chemistry: Secondary | ICD-10-CM

## 2022-10-14 LAB — LIPID PANEL
Chol/HDL Ratio: 4 ratio (ref 0.0–4.4)
Cholesterol, Total: 254 mg/dL — ABNORMAL HIGH (ref 100–199)
HDL: 64 mg/dL (ref >39)
LDL Chol Calc (NIH): 160 mg/dL — ABNORMAL HIGH (ref 0–99)
Triglycerides: 168 mg/dL — ABNORMAL HIGH (ref 0–149)
VLDL Cholesterol Cal: 30 mg/dL (ref 5–40)

## 2022-10-14 LAB — COMPREHENSIVE METABOLIC PANEL
ALT: 25 IU/L (ref 0–32)
AST: 20 IU/L (ref 0–40)
Albumin: 4.5 g/dL (ref 3.8–4.9)
Alkaline Phosphatase: 120 IU/L (ref 44–121)
BUN/Creatinine Ratio: 19 (ref 9–23)
BUN: 15 mg/dL (ref 6–24)
Bilirubin Total: 0.4 mg/dL (ref 0.0–1.2)
CO2: 25 mmol/L (ref 20–29)
Calcium: 9.3 mg/dL (ref 8.7–10.2)
Chloride: 101 mmol/L (ref 96–106)
Creatinine, Ser: 0.78 mg/dL (ref 0.57–1.00)
Globulin, Total: 2.7 g/dL (ref 1.5–4.5)
Glucose: 103 mg/dL — ABNORMAL HIGH (ref 70–99)
Potassium: 4.3 mmol/L (ref 3.5–5.2)
Sodium: 142 mmol/L (ref 134–144)
Total Protein: 7.2 g/dL (ref 6.0–8.5)
eGFR: 90 mL/min/{1.73_m2} (ref >59)

## 2022-10-14 LAB — CBC
Hematocrit: 44.9 % (ref 34.0–46.6)
Hemoglobin: 14.3 g/dL (ref 11.1–15.9)
MCH: 29.1 pg (ref 26.6–33.0)
MCHC: 31.8 g/dL (ref 31.5–35.7)
MCV: 91 fL (ref 79–97)
Platelets: 393 10*3/uL (ref 150–450)
RBC: 4.91 x10E6/uL (ref 3.77–5.28)
RDW: 13.3 % (ref 11.7–15.4)
WBC: 9.4 10*3/uL (ref 3.4–10.8)

## 2022-10-20 ENCOUNTER — Ambulatory Visit: Payer: 59

## 2022-10-20 ENCOUNTER — Ambulatory Visit (INDEPENDENT_AMBULATORY_CARE_PROVIDER_SITE_OTHER): Payer: 59 | Admitting: Family Medicine

## 2022-10-20 ENCOUNTER — Encounter: Payer: Self-pay | Admitting: Family Medicine

## 2022-10-20 VITALS — BP 133/87 | HR 75 | Resp 18 | Ht 59.75 in | Wt 220.0 lb

## 2022-10-20 DIAGNOSIS — F411 Generalized anxiety disorder: Secondary | ICD-10-CM

## 2022-10-20 DIAGNOSIS — E782 Mixed hyperlipidemia: Secondary | ICD-10-CM | POA: Diagnosis not present

## 2022-10-20 DIAGNOSIS — G4700 Insomnia, unspecified: Secondary | ICD-10-CM

## 2022-10-20 DIAGNOSIS — H60503 Unspecified acute noninfective otitis externa, bilateral: Secondary | ICD-10-CM

## 2022-10-20 MED ORDER — CIPROFLOXACIN-HYDROCORTISONE 0.2-1 % OT SUSP: 3.0000 [drp] | Freq: Two times a day (BID) | OTIC | 0 refills | Status: AC

## 2022-10-20 NOTE — Assessment & Plan Note (Signed)
Stable even after discontinuing Prozac.  Will continue to monitor.

## 2022-10-20 NOTE — Patient Instructions (Signed)
Let me know if the cyst comes back and we can do a short course of antibiotics.  Magnesium glycinate supplement: https://a.co/[email or web address]

## 2022-10-20 NOTE — Progress Notes (Addendum)
Established Patient Office Visit  Subjective   Patient ID: Angela Ramsey, female    DOB: 1966/02/03  Age: 57 y.o. MRN: [id number]  Chief Complaint  Patient presents with   Ear Pain    left   Mass    Left shoulder     HPI Angela Ramsey is a 57 y.o. female presenting today for follow up of hyperlipidemia, weight management, anxiety.  She also continues to have bilateral itching in her ears and some tragus tenderness even after finishing treatment for a fungal ear infection.  Her abscess from a few months ago has finally resolved after seeing general surgery, but she now has a smaller bump on her left shoulder that she is concerned is another cyst. Hyperlipidemia: Currently consuming a general diet. Exercise is limited by low energy. The 10-year ASCVD risk score (Arnett DK, et al., 2019) is: 2.4% Weight management: Weight has increased 6 lbs since last visit.  She admits that she has not been able to commit to her nutritional and physical activity goals.  She has very low energy during the day and also has trouble with sleeping.  She finds that it is difficult to fall asleep and stay asleep, and during the day her energy is so low that she does not have the motivation to do anything. Mood: Patient is here to follow up for anxiety.  Since last appointment, has completed taper off of Prozac.  She states that she is doing well and anxiety is still well-controlled even after discontinuing the Prozac.  She is unsure if the Prozac discontinuation is contributing at all to her trouble sleeping or her energy levels.  ROS Negative unless otherwise noted in HPI   Objective:     BP 133/87 (BP Location: Left Arm, Patient Position: Sitting, Cuff Size: Large)   Pulse 75   Resp 18   Ht 4' 11.75" (1.518 m)   Wt 220 lb (99.8 kg)   LMP 12/19/2015 Comment: sexually active  SpO2 94%   BMI 43.33 kg/m   Physical Exam Constitutional:      General: She is not in acute distress.     Appearance: Normal appearance.  HENT:     Head: Normocephalic and atraumatic.     Right Ear: Tympanic membrane and external ear normal. Tenderness present.     Left Ear: Tympanic membrane and external ear normal. Tenderness present.     Ears:     Comments: Mild erythema of auditory canals bilaterally Cardiovascular:     Rate and Rhythm: Normal rate and regular rhythm.     Heart sounds: No murmur heard.    No friction rub. No gallop.  Pulmonary:     Effort: Pulmonary effort is normal. No respiratory distress.     Breath sounds: No wheezing, rhonchi or rales.  Skin:    General: Skin is warm and dry.     Comments: Palpable mass over left shoulder.  With minimal pressure, drained thick white substance  Neurological:     Mental Status: She is alert and oriented to person, place, and time.     Assessment & Plan:  Mixed hyperlipidemia Assessment & Plan: LDL elevated but stable, triglycerides have increased.  Continue with diet and lifestyle intervention, continue fish oil supplement.  We will continue to monitor cholesterol levels as she continues with her weight management journey.  If needed, we may discuss starting a low-dose statin medication.   obesity (HCC) Assessment & Plan: Encouraged her to continue a low-salt  diet but consider to also limits sugars and unhealthy fats.  Encouraged to start a regular exercise routine.  Our goal is to get her energy levels back to normal so that it is easier for her to maintain the motivation and ability to meet these goals.   GAD (generalized anxiety disorder) Assessment & Plan: Stable even after discontinuing Prozac.  Will continue to monitor.   Acute otitis externa of both ears, unspecified type -     Ciprofloxacin-Hydrocortisone; Place 3 drops into both ears 2 (two) times daily for 5 days.  Dispense: 10 mL; Refill: 0  Insomnia, unspecified type Assessment & Plan: Work on good sleep hygiene, can start magnesium glycinate supplement.   Possible that insomnia is related to discontinuation of Prozac, ongoing anxiety, menopause.  Will follow-up in 2 months and assess whether prescription medication is needed for sleep induction and sleep maintenance.   Reviewed most recent labs.  Within normal limits with the exception of LDL elevated but stable at 160, triglycerides increased significantly to 168.  Fungal infection has been eradicated, prescribed short course of ciprofloxacin otic drops to eradicate bacterial cause of acute otitis externa.  Drained small cyst on left shoulder is much as possible using manual pressure today.  Advised her to continue monitoring, if necessary can do course of antibiotics if it continues to grow even after draining.  Return in about 2 months (around 12/20/2022) for follow-up for sleep.    Melida Quitter, PA

## 2022-10-20 NOTE — Assessment & Plan Note (Signed)
Work on good sleep hygiene, can start magnesium glycinate supplement.  Possible that insomnia is related to discontinuation of Prozac, ongoing anxiety, menopause.  Will follow-up in 2 months and assess whether prescription medication is needed for sleep induction and sleep maintenance.

## 2022-10-20 NOTE — Assessment & Plan Note (Signed)
Encouraged her to continue a low-salt diet but consider to also limits sugars and unhealthy fats.  Encouraged to start a regular exercise routine.  Our goal is to get her energy levels back to normal so that it is easier for her to maintain the motivation and ability to meet these goals.

## 2022-10-20 NOTE — Assessment & Plan Note (Signed)
LDL elevated but stable, triglycerides have increased.  Continue with diet and lifestyle intervention, continue fish oil supplement.  We will continue to monitor cholesterol levels as she continues with her weight management journey.  If needed, we may discuss starting a low-dose statin medication.

## 2022-11-04 ENCOUNTER — Ambulatory Visit: Payer: 59

## 2022-11-27 ENCOUNTER — Ambulatory Visit
Admission: RE | Admit: 2022-11-27 | Discharge: 2022-11-27 | Disposition: A | Discharge status: Home / Self Care | Payer: 59 | Source: Ambulatory Visit | Attending: Family Medicine | Admitting: Family Medicine

## 2022-11-27 DIAGNOSIS — Z1231 Encounter for screening mammogram for malignant neoplasm of breast: Secondary | ICD-10-CM

## 2022-12-02 ENCOUNTER — Other Ambulatory Visit: Payer: Self-pay | Admitting: Family Medicine

## 2022-12-02 DIAGNOSIS — R928 Other abnormal and inconclusive findings on diagnostic imaging of breast: Secondary | ICD-10-CM

## 2022-12-10 ENCOUNTER — Ambulatory Visit
Admission: RE | Admit: 2022-12-10 | Discharge: 2022-12-10 | Disposition: A | Discharge status: Home / Self Care | Payer: 59 | Source: Ambulatory Visit | Attending: Family Medicine | Admitting: Family Medicine

## 2022-12-10 ENCOUNTER — Ambulatory Visit: Payer: 59

## 2022-12-10 DIAGNOSIS — R928 Other abnormal and inconclusive findings on diagnostic imaging of breast: Secondary | ICD-10-CM

## 2022-12-14 ENCOUNTER — Encounter: Payer: Self-pay | Admitting: Family Medicine

## 2022-12-21 ENCOUNTER — Ambulatory Visit (INDEPENDENT_AMBULATORY_CARE_PROVIDER_SITE_OTHER): Payer: 59 | Admitting: Family Medicine

## 2022-12-21 ENCOUNTER — Encounter: Payer: Self-pay | Admitting: Family Medicine

## 2022-12-21 VITALS — BP 121/82 | HR 76 | Resp 18 | Ht 59.75 in | Wt 223.0 lb

## 2022-12-21 DIAGNOSIS — G4700 Insomnia, unspecified: Secondary | ICD-10-CM | POA: Diagnosis not present

## 2022-12-21 NOTE — Assessment & Plan Note (Signed)
Continue good sleep hygiene and magnesium glycinate supplement.  Discussed that moving the time of the magnesium supplement may be helpful, she will try moving it earlier in the evening.  Will follow-up in 2 months and assess whether prescription medication is needed for sleep induction and sleep maintenance.

## 2022-12-21 NOTE — Patient Instructions (Signed)
Try hydrocortisone cream for the itching in addition to massaging the scar.  You can also try moving the time of night that you take your magnesium to see if it helps you to stay asleep.

## 2022-12-21 NOTE — Progress Notes (Signed)
Established Patient Office Visit  Subjective   Patient ID: Angela Ramsey, female    DOB: Jul 08, 1965  Age: 57 y.o. MRN: [id number]  Chief Complaint  Patient presents with   Insomnia    HPI Angela Ramsey is a 57 y.o. female presenting today for follow up of insomnia. Insomnia: She struggles with sleep induction and sleep maintenance. At last appointment, discussed possibility that insomnia was related to Prozac discontinuation, ongoing anxiety, or menopause. Started magnesium glycinate supplement at bedtime.  Patient reports that sleep has improved, but there is still some nights where she cannot seem to get her brain to shut down.  It is typically 2 nights a week that she is waking up around 1 AM and is not able to fall back to sleep until about 3 AM.  At this point, she would like to give herself some more time before needing to take medication to sleep.  She also continues to have burning and itching sensation surrounding the scar on her back after cyst removal with general surgery.  Last week, her left ear started itching and hurting, so she used the ciprofloxacin eardrops prescribed in the past and symptoms have resolved.  Outpatient Medications Prior to Visit  Medication Sig   B Complex Vitamins (VITAMIN B COMPLEX PO) Take by mouth daily.   Cholecalciferol (VITAMIN D) 50 MCG (2000 UT) tablet Take 1 tablet (2,000 Units total) by mouth daily.   Multiple Vitamins-Minerals (ALIVE MULTI-VITAMIN) CHEW Chew by mouth daily.   Omega-3 Fatty Acids (FISH OIL PO) Take 1 capsule by mouth daily. Unsure of dosage   No facility-administered medications prior to visit.    ROS Negative unless otherwise noted in HPI   Objective:     BP 121/82 (BP Location: Left Arm, Patient Position: Sitting, Cuff Size: Large)   Pulse 76   Resp 18   Ht 4' 11.75" (1.518 m)   Wt 223 lb (101.2 kg)   LMP 12/19/2015 Comment: sexually active  SpO2 99%   BMI 43.92 kg/m   Physical  Exam Constitutional:      General: She is not in acute distress.    Appearance: Normal appearance.  HENT:     Head: Normocephalic and atraumatic.     Comments: No signs of ear infection    Right Ear: Tympanic membrane, ear canal and external ear normal. There is no impacted cerumen.     Left Ear: Tympanic membrane, ear canal and external ear normal. There is no impacted cerumen.  Pulmonary:     Effort: Pulmonary effort is normal. No respiratory distress.  Musculoskeletal:     Cervical back: Normal range of motion.  Skin:    Comments: 3 cm scar just lateral to midline on the right side of the thoracic back at the level of the scapula.  No drainage, erythema, warmth.  Scar is firm, rigid.  Neurological:     General: No focal deficit present.     Mental Status: She is alert and oriented to person, place, and time. Mental status is at baseline.  Psychiatric:        Mood and Affect: Mood normal.        Thought Content: Thought content normal.        Judgment: Judgment normal.     Assessment & Plan:  Insomnia, unspecified type Assessment & Plan: Continue good sleep hygiene and magnesium glycinate supplement.  Discussed that moving the time of the magnesium supplement may be helpful, she will try moving it  earlier in the evening.  Will follow-up in 2 months and assess whether prescription medication is needed for sleep induction and sleep maintenance.   Discussed that itching is a part of the healing process, but she can use an over-the-counter hydrocortisone cream around the area of the scar to alleviate itching.  Recommend massaging scar to prevent pulling/burning sensation.  Patient verbalized understanding and is agreeable to this plan.  Return in about 2 months (around 02/20/2023) for follow-up for sleep and scar discomfort.    Melida Quitter, PA

## 2022-12-28 ENCOUNTER — Other Ambulatory Visit: Payer: Self-pay | Admitting: Medical Genetics

## 2022-12-28 DIAGNOSIS — Z006 Encounter for examination for normal comparison and control in clinical research program: Secondary | ICD-10-CM

## 2023-01-01 ENCOUNTER — Other Ambulatory Visit
Admission: RE | Admit: 2023-01-01 | Discharge: 2023-01-01 | Disposition: A | Discharge status: Home / Self Care | Payer: Self-pay | Source: Ambulatory Visit | Attending: Medical Genetics | Admitting: Medical Genetics

## 2023-01-01 DIAGNOSIS — Z006 Encounter for examination for normal comparison and control in clinical research program: Secondary | ICD-10-CM | POA: Insufficient documentation

## 2023-01-12 LAB — GENECONNECT MOLECULAR SCREEN: Genetic Analysis Overall Interpretation: NEGATIVE

## 2023-02-22 ENCOUNTER — Ambulatory Visit (INDEPENDENT_AMBULATORY_CARE_PROVIDER_SITE_OTHER): Payer: 59 | Admitting: Family Medicine

## 2023-02-22 ENCOUNTER — Encounter: Payer: Self-pay | Admitting: Family Medicine

## 2023-02-22 VITALS — BP 139/82 | Temp 98.2°F | Ht 59.75 in | Wt 227.2 lb

## 2023-02-22 DIAGNOSIS — G4700 Insomnia, unspecified: Secondary | ICD-10-CM | POA: Diagnosis not present

## 2023-02-22 DIAGNOSIS — M170 Bilateral primary osteoarthritis of knee: Secondary | ICD-10-CM | POA: Diagnosis not present

## 2023-02-22 NOTE — Assessment & Plan Note (Signed)
 Encouraged her to continue a low-salt diet but consider to also limits sugars and unhealthy fats.  Encouraged to start a regular exercise routine.  Provided information for helpful podcasts like The Obesity Guide with Matthea Rentea.  Patient would like to wait on any referrals for now but is aware that a referral can be made for additional support to [facility] or [facility].

## 2023-02-22 NOTE — Assessment & Plan Note (Signed)
 Recommend to call and schedule with Dr. Lajoyce Corners, last appointment May 2023 so she should not need a referral.

## 2023-02-22 NOTE — Progress Notes (Signed)
   Established Patient Office Visit  Subjective   Patient ID: Angela Ramsey, female    DOB: [birth year]  Age: 58 y.o. MRN: [id number]  Chief Complaint  Patient presents with   Follow Up Sleep   Knee Pain    HPI Angela Ramsey is a 58 y.o. female presenting today for follow up of sleep.  She has struggled with both sleep induction and sleep maintenance.  At last appointment, encouraged to continue with good sleep hygiene as well as magnesium glycinate supplement.  She was going to try moving the magnesium glycinate dose earlier in the evening for improvement of sleep induction.  Her scar continues to heal from the sebaceous cyst that was removed on her back.  She does note that she has been having right knee pain that she believes may be related to her weight in combination with chronic osteoarthritis.  She previously saw Dr. Duda and found steroid injections helpful and would like to return to orthopedics.  She was also working on [employer] and feels that she would benefit from additional support, but she is not sure that she is ready for a referral to a specialist yet.  Outpatient Medications Prior to Visit  Medication Sig   B Complex Vitamins (VITAMIN B COMPLEX PO) Take by mouth daily.   Cholecalciferol (VITAMIN D ) 50 MCG (2000 UT) tablet Take 1 tablet (2,000 Units total) by mouth daily.   Multiple Vitamins-Minerals (ALIVE MULTI-VITAMIN) CHEW Chew by mouth daily.   Omega-3 Fatty Acids (FISH OIL PO) Take 1 capsule by mouth daily. Unsure of dosage   No facility-administered medications prior to visit.    ROS Negative unless otherwise noted in HPI   Objective:     BP 139/82   Temp 98.2 F (36.8 C) (Oral)   Ht 4' 11.75 (1.518 m)   Wt 227 lb 4 oz (103.1 kg)   LMP 12/19/2015 Comment: sexually active  SpO2 93%   BMI 44.75 kg/m   Physical Exam Musculoskeletal:     Right knee: No swelling, deformity, effusion or ecchymosis. Normal range of motion. Tenderness  present over the medial joint line.     Left knee: No swelling, deformity, effusion or ecchymosis. Normal range of motion.     Right lower leg: Normal.     Left lower leg: Normal.      Assessment & Plan:  Insomnia, unspecified type Assessment & Plan: Continue good sleep hygiene and magnesium glycinate supplement.     Bilateral primary osteoarthritis of knee Assessment & Plan: Recommend to call and schedule with Dr. Harden, last appointment May 2023 so she should not need a referral.   obesity (HCC) Assessment & Plan: Encouraged her to continue a low-salt diet but consider to also limits sugars and unhealthy fats.  Encouraged to start a regular exercise routine.  Provided information for helpful podcasts like The Obesity Guide with Matthea Rentea.  Patient would like to wait on any referrals for now but is aware that a referral can be made for additional support to [facility] or [facility].     Return in about 15 weeks (around 06/07/2023) for annual physical, fasting blood work 1 week before.    Joesph DELENA Sear, PA

## 2023-02-22 NOTE — Patient Instructions (Addendum)
 From what I could find, you should be able to call Dr. Crist office to schedule an appointment!  It has only been about 18 months since your last appointment there.  If you would ever like a referral to the [facility] or to [facility], just let us  know!  PODCASTS: -The Obesity Guide with Matthea Rentea -Antiaging Unraveled with Katheryn Police

## 2023-02-22 NOTE — Assessment & Plan Note (Signed)
 Continue good sleep hygiene and magnesium glycinate supplement.

## 2023-03-25 ENCOUNTER — Encounter: Payer: Self-pay | Admitting: Family Medicine

## 2023-05-23 IMAGING — CT CT HEART MORP W/ CTA COR W/ SCORE W/ CA W/CM &/OR W/O CM
4 of 7 series · 8 of 20 positions shown, 9 images · IV contrast (APPLIED)
Comparison: 09/27/2015.
COMPARISON: 09/27/2015.

Addendum:
EXAM:
OVER-READ INTERPRETATION  CT CHEST

The following report is an over-read performed by radiologist Dr.
over-read does not include interpretation of cardiac or coronary
anatomy or pathology. The coronary calcium score/coronary CTA
interpretation by the cardiologist is attached.
CLINICAL DATA: 55 Year-old White Female
Cardiac/Coronary  CTA
TECHNIQUE: The patient was scanned on a [employer] Force scanner.

[Series 6: best diast · axial · 0.39mm/px · z∈[+1132,+1168]mm · 2 of 278 slices shown, 3 images]
[im 93/278  vessel]
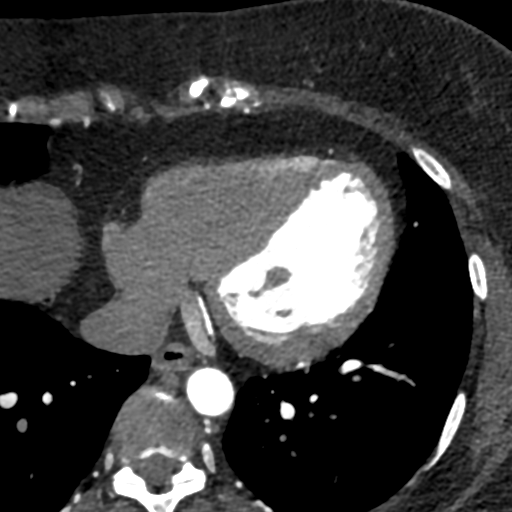
[im 93/278  lung]
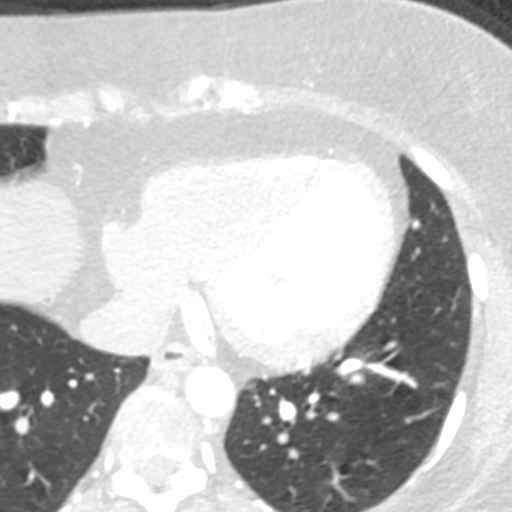
[im 185/278  vessel]
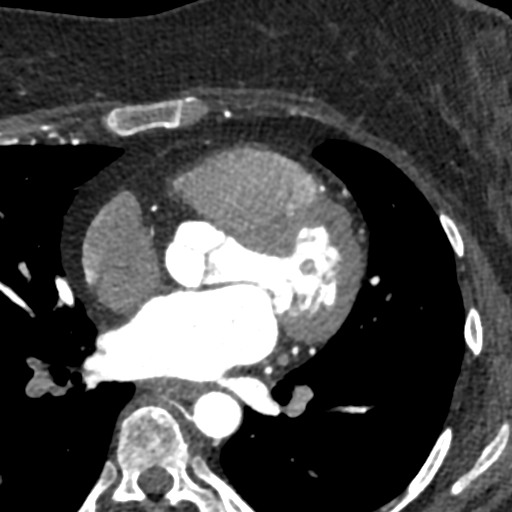

[Series 7: best syst · axial · 0.39mm/px · z∈[+1132,+1168]mm · 2 of 278 slices shown]
[im 93/278  vessel]
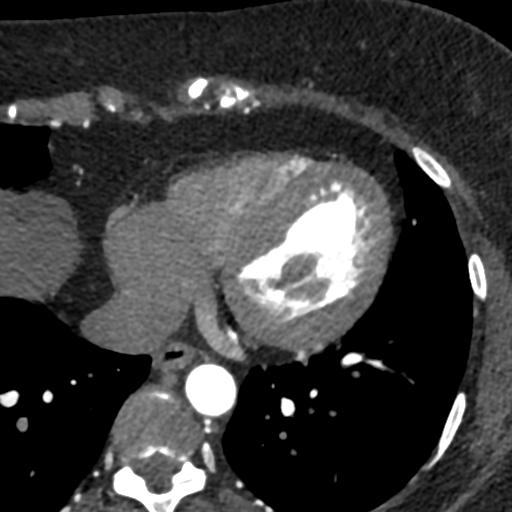
[im 185/278  vessel]
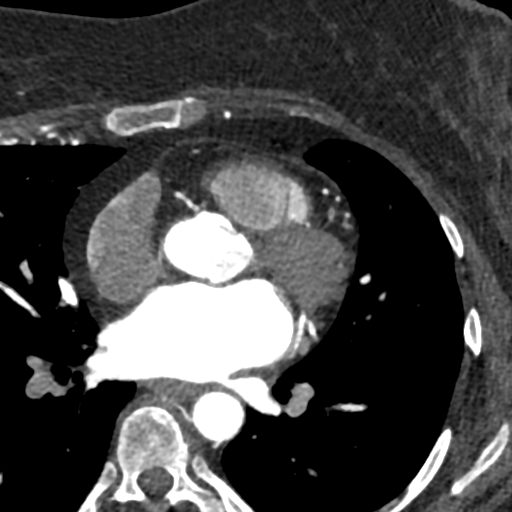

[Series 8: ts diast sharp 74 % · axial · 0.39mm/px · z∈[+1132,+1168]mm · 2 of 278 slices shown]
[im 93/278  lung]
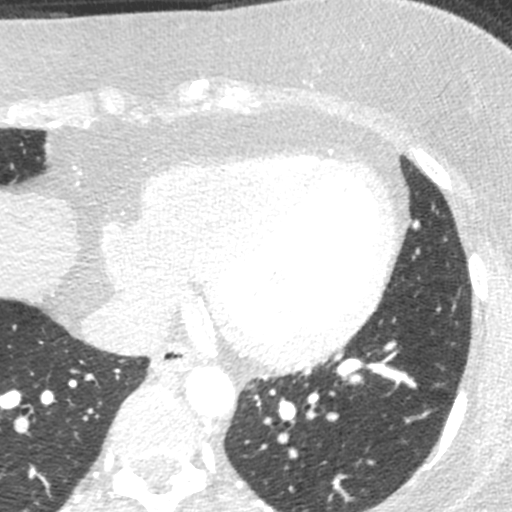
[im 185/278  lung]
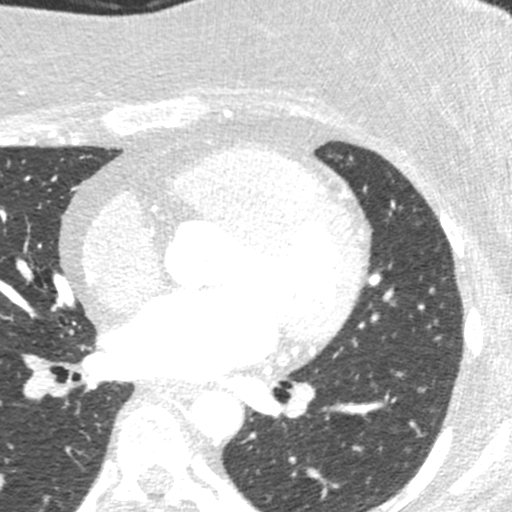

[Series 9: ts syst sharp · axial · 0.39mm/px · z∈[+1132,+1168]mm · 2 of 278 slices shown]
[im 93/278  lung]
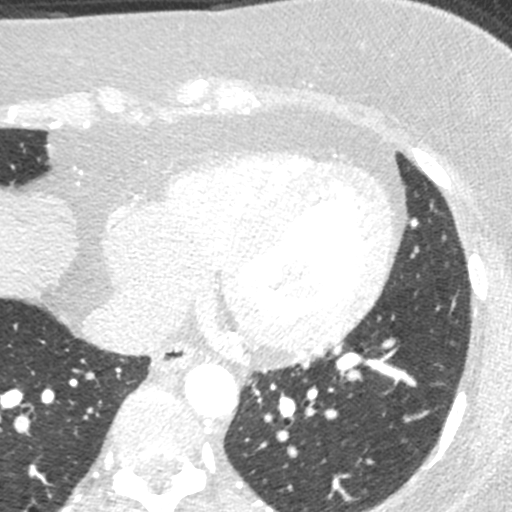
[im 185/278  lung]
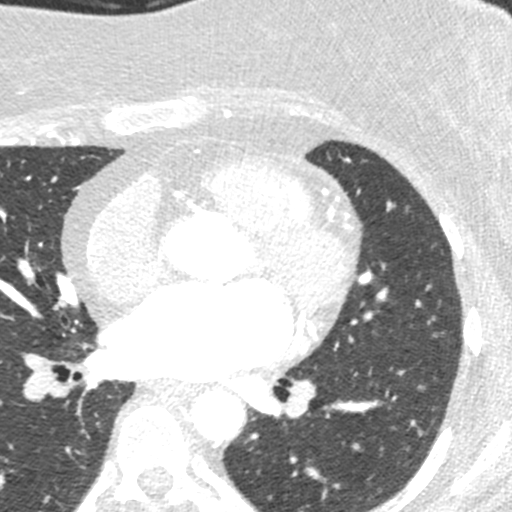

[8 of 20 positions shown; findings below may reference images not displayed]

FINDINGS: Vascular: No significant vascular findings. Normal heart size. No
pericardial effusion.

Mediastinum/Nodes: No enlarged mediastinal lymph nodes or mass.

Lungs/Pleura: No pleural effusion, airspace consolidation, or
pneumothorax. No suspicious pulmonary nodule or mass identified.

Upper Abdomen: No acute abnormality.

Musculoskeletal: No chest wall mass or suspicious bone lesions
identified.
IMPRESSION: No significant noncardiac supplemental findings.
FINDINGS: Scan was triggered in the descending thoracic aorta. Axial
non-contrast 3 mm slices were carried out through the heart. The
data set was analyzed on a dedicated work station and scored using
the Agatson method. Gantry rotation speed was 250 msecs and
collimation was .6 mm. 0.8 mg of sl NTG was given. The 3D data set
was reconstructed in 5% intervals of the 67-82 % of the R-R cycle.
Diastolic phases were analyzed on a dedicated work station using
MPR, MIP and VRT modes. The patient received 95 cc of contrast.

Aorta:  Normal size.  No calcifications.  No dissection.

Main Pulmonary Artery: Normal size of the pulmonary artery.

Aortic Valve:  Tri-leaflet.  No calcifications.

Coronary Arteries:  Normal coronary origin.  Left dominance.

Coronary Calcium Score:

Left main: 0

Left anterior descending artery: 0

Left circumflex artery: 0

Right coronary artery: 0

Total: 0

Percentile: 1st for age, sex, and race matched control.

RCA is a non-dominant artery.  There is no significant plaque.

Left main is a large artery that gives rise to LAD and LCX arteries.
There is no significant plaque.

LAD is a large vessel that gives rise to multiple small diagonal
vessels. There is no significant plaque.

LCX is a dominant artery that gives rise to two OM vessels and an
L-PDA. There is no significant plaque.

Other findings:

Normal pulmonary vein drainage into the left atrium.

Normal left atrial appendage without a thrombus.

Extra-cardiac findings: See attached radiology report for
non-cardiac structures.

Body motion (slab & stair-step) artifact.
IMPRESSION: 1. Coronary calcium score of 0. This was 1st percentile for age,
sex, and race matched control.

2. Normal coronary origin with left dominance.

3. CAD-RADS 0. No evidence of CAD (0%). Consider non-atherosclerotic
causes of chest pain.

RECOMMENDATIONS:



If CAC = 0, it is reasonable to withhold statin therapy and reassess
in 5 to 10 years, as long as higher risk conditions are absent
(diabetes mellitus, family history of premature CHD in first degree
relatives (males <55 years; females <65 years), cigarette smoking,
LDL >=190 mg/dL or other independent risk factors).

If CAC is 1 to 99, it is reasonable to initiate statin therapy for
patients >=55 years of age.

If CAC is >=100 or >=75th percentile, it is reasonable to initiate
statin therapy at any age.

Cardiology referral should be considered for patients with CAC
scores =400 or >=75th percentile.

*2444 AHA/ACC/AACVPR/AAPA/ABC/HAKIMUDDIN/RONAK/NOVALJA/Merita/ZEINAB/SCHROCK/LALL
Guideline on the Management of Blood Cholesterol: A Report of the
American College of Cardiology/American Heart Association Task Force
on Clinical Practice Guidelines. J Am Coll Cardiol.
1190;73(24):7515-7393.

*** End of Addendum ***
EXAM:
OVER-READ INTERPRETATION  CT CHEST

The following report is an over-read performed by radiologist Dr.
over-read does not include interpretation of cardiac or coronary
anatomy or pathology. The coronary calcium score/coronary CTA
interpretation by the cardiologist is attached.
FINDINGS: Vascular: No significant vascular findings. Normal heart size. No
pericardial effusion.

Mediastinum/Nodes: No enlarged mediastinal lymph nodes or mass.

Lungs/Pleura: No pleural effusion, airspace consolidation, or
pneumothorax. No suspicious pulmonary nodule or mass identified.

Upper Abdomen: No acute abnormality.

Musculoskeletal: No chest wall mass or suspicious bone lesions
identified.
IMPRESSION: No significant noncardiac supplemental findings.

## 2023-06-07 ENCOUNTER — Encounter: Payer: Self-pay | Admitting: Obstetrics and Gynecology

## 2023-06-07 ENCOUNTER — Other Ambulatory Visit: Payer: Self-pay | Admitting: Obstetrics and Gynecology

## 2023-06-07 ENCOUNTER — Ambulatory Visit (INDEPENDENT_AMBULATORY_CARE_PROVIDER_SITE_OTHER): Admitting: Obstetrics and Gynecology

## 2023-06-07 VITALS — BP 142/80 | HR 79 | Temp 99.1°F | Ht 61.0 in | Wt 230.0 lb

## 2023-06-07 DIAGNOSIS — Z1239 Encounter for other screening for malignant neoplasm of breast: Secondary | ICD-10-CM

## 2023-06-07 DIAGNOSIS — Z01419 Encounter for gynecological examination (general) (routine) without abnormal findings: Secondary | ICD-10-CM

## 2023-06-07 DIAGNOSIS — N951 Menopausal and female climacteric states: Secondary | ICD-10-CM | POA: Diagnosis not present

## 2023-06-07 DIAGNOSIS — N3281 Overactive bladder: Secondary | ICD-10-CM | POA: Diagnosis not present

## 2023-06-07 DIAGNOSIS — Z1331 Encounter for screening for depression: Secondary | ICD-10-CM | POA: Diagnosis not present

## 2023-06-07 DIAGNOSIS — Z9189 Other specified personal risk factors, not elsewhere classified: Secondary | ICD-10-CM

## 2023-06-07 DIAGNOSIS — N958 Other specified menopausal and perimenopausal disorders: Secondary | ICD-10-CM | POA: Insufficient documentation

## 2023-06-07 LAB — URINALYSIS, COMPLETE W/RFL CULTURE
Bacteria, UA: NONE SEEN /HPF
Bilirubin Urine: NEGATIVE
Glucose, UA: NEGATIVE
Hgb urine dipstick: NEGATIVE
Hyaline Cast: NONE SEEN /LPF
Ketones, ur: NEGATIVE
Leukocyte Esterase: NEGATIVE
Nitrites, Initial: NEGATIVE
Protein, ur: NEGATIVE
RBC / HPF: NONE SEEN /HPF (ref 0–2)
Specific Gravity, Urine: 1.025 (ref 1.001–1.035)
WBC, UA: NONE SEEN /HPF (ref 0–5)
pH: 5.5 (ref 5.0–8.0)

## 2023-06-07 LAB — NO CULTURE INDICATED

## 2023-06-07 MED ORDER — GABAPENTIN 100 MG PO CAPS: ORAL_CAPSULE | ORAL | 3 refills | Status: AC

## 2023-06-07 MED ORDER — MIRABEGRON ER 25 MG PO TB24: 25.0000 mg | ORAL_TABLET | Freq: Every day | ORAL | 3 refills | Status: DC

## 2023-06-07 MED ORDER — ESTRADIOL 0.1 MG/GM VA CREA: TOPICAL_CREAM | VAGINAL | 1 refills | Status: AC

## 2023-06-07 NOTE — Assessment & Plan Note (Signed)
Reviewed safety profile of low dose vaginal estrogen, however reviewed that higher doses have been associated with DVT, breast and uterine cancer.

## 2023-06-07 NOTE — Patient Instructions (Signed)

## 2023-06-07 NOTE — Assessment & Plan Note (Signed)
 Cervical cancer screening performed according to ASCCP guidelines. Encouraged annual mammogram and breast MRI  screening Colonoscopy UTD DXA N/A Labs and immunizations with her primary Encouraged safe sexual practices as indicated Encouraged healthy lifestyle practices with diet and exercise For patients under 50-56yo, I recommend 1200mg  calcium daily and 600IU of vitamin D  daily.

## 2023-06-07 NOTE — Assessment & Plan Note (Signed)
 Discussed nonhormonal options for VMS, including SSRIs, veozah, gabapetin. Patient elects for gabapentin , has used before postop pain. Reviewed side effects such as hypotension, mood irritability, GI upset, insomnia, and drowsiness. F/u in 3mon

## 2023-06-07 NOTE — Assessment & Plan Note (Addendum)
 Patient with OAB symptoms. Recommend medical management (including use of myrbetriq , gemtesa (okay with HTN), trospium, oxybutynin). Will start myrbetriq . Continue kegels. RTO in 3 months

## 2023-06-07 NOTE — Progress Notes (Addendum)
 58 y.o. G102P2002 female s/p Endometrial Ablation with family h/o Breast Ca (neg genetic testing Aug 2016, high risk, annual MRI) here for annual exam. Married. Homemaker.  Patient's last menstrual period was 12/19/2015.   She is due for breast MRI, need referral.  Urinary leakage and urgency. Voids 6-8xday. Nocturia x1. Completed PFPT with urology years ago. Stays hot a lo, has night sweats, sleep is fan nightly  Abnormal bleeding: none Pelvic discharge or pain: none Breast mass, nipple discharge or skin changes : none Last PAP:     Component Value Date/Time   DIAGPAP  06/04/2022 1448    - Negative for intraepithelial lesion or malignancy (NILM)   ADEQPAP  06/04/2022 1448    Satisfactory for evaluation; transformation zone component PRESENT.   Last mammogram: 12/10/2022 BI-RADS 1, density B Last colonoscopy: 09/30/2021 Sexually active: Yes Exercising: No Smoker: No  Flowsheet Row Office Visit from 06/07/2023 in [facility]  PHQ-2 Total Score 0       Flowsheet Row Office Visit from 02/22/2023 in 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     Cholecalciferol (VITAMIN D ) 50 MCG (2000 UT) tablet Take 1 tablet (2,000 Units total) by mouth daily.     Multiple Vitamins-Minerals (ALIVE MULTI-VITAMIN) CHEW Chew by mouth daily.     Omega-3 Fatty Acids (FISH OIL PO) Take 1 capsule by mouth daily. Unsure of dosage     No current facility-administered medications on file prior to visit.    Social History   Socioeconomic History   Marital status: Married    Spouse name: Arnetta Lank   Number of children: 2   Years of education: Not on file   Highest education level: 12th grade  Occupational History   Not on file  Tobacco Use   Smoking status: Never    Passive exposure: Never   Smokeless tobacco: Never  Vaping Use   Vaping status: Never Used  Substance and Sexual Activity   Alcohol use: Yes    Comment: 1 every other week or less   Drug use: No   Sexual activity: Yes  Partners: Male    Birth control/protection: Other-see comments, Post-menopausal    Comment: 1st intercourse 22 yo-5 partners, husband vasectomy, ablation  Other Topics Concern   Not on file  Social History Narrative   Lives with husband   Caffeine- 30 oz Coke a day   Social Drivers of Corporate investment banker Strain: Low Risk  (02/18/2023)   Overall Financial Resource Strain (CARDIA)    Difficulty of Paying Living Expenses: Not hard at all  Food Insecurity: No Food Insecurity (02/18/2023)   Hunger Vital Sign    Worried About Running Out of Food in the Last Year: Never true    Ran Out of Food in the Last Year: Never true  Transportation Needs: No Transportation Needs (02/18/2023)   PRAPARE - Administrator, Civil Service (Medical): No    Lack of Transportation (Non-Medical): No  Physical Activity: Unknown (02/18/2023)   Exercise Vital Sign    Days of Exercise per Week: 0 days     Minutes of Exercise per Session: Not on file  Stress: No Stress Concern Present (02/18/2023)   [employer] of Occupational Health - Occupational Stress Questionnaire    Feeling of Stress : Only a little  Social Connections: Moderately Integrated (02/18/2023)   Social Connection and Isolation Panel [NHANES]    Frequency of Communication with Friends and Family: More than three times a week    Frequency of Social Gatherings with Friends and Family: Once a week    Attends Religious Services: More than 4 times per year    Active Member of [employer] or Organizations: No    Attends Engineer, structural: Not on file    Marital Status: Married  Catering manager Violence: Not on file    Family History  Problem Relation Age of Onset   Hypertension Mother    Hypertension Father    Hyperlipidemia Father    Stroke Father    Skin cancer Father    Thyroid cancer Sister        dx in her early to mid 34s   Cancer Sister        thyroid   Breast cancer Maternal Aunt 21       mother's identical twin   Cancer Maternal Aunt        breast   Breast cancer Maternal Aunt 92       second cancer at  39   Cancer Maternal Aunt        breast x 2   Breast cancer Paternal Aunt 13   Breast cancer Maternal Grandmother        dx in her late 48s to early 58s   Cancer Maternal Grandmother        breast   Cancer Maternal Grandfather        lung   Diabetes Paternal Grandmother    Leukemia Paternal Grandfather    Cancer Paternal Grandfather        leukemia   Colon cancer Neg Hx    Esophageal cancer Neg Hx    Rectal cancer Neg Hx    Stomach cancer Neg Hx    Colon polyps Neg Hx    Crohn's disease Neg Hx     No Known Allergies    PE Today's Vitals   06/07/23 0945  BP: (!) 142/80  Pulse: 79  Temp: 99.1 F (37.3 C)  TempSrc: Oral  SpO2: 97%  Weight: 230 lb (104.3 kg)  Height: 5\' 1"  (1.549 m)  Body mass index is 43.46 kg/m.  Physical Exam Vitals reviewed. Exam conducted with a  chaperone present.  Constitutional:      General: She is not in acute distress.    Appearance: Normal appearance.  HENT:     Head: Normocephalic and atraumatic.     Nose: Nose normal.  Eyes:     Extraocular Movements: Extraocular movements intact.     Conjunctiva/sclera: Conjunctivae normal.  Neck:     Thyroid: No thyroid mass, thyromegaly or thyroid tenderness.  Pulmonary:     Effort: Pulmonary effort is normal.  Chest:     Chest wall: No mass or tenderness.  Breasts:    Right: Normal. No swelling, mass, nipple discharge, skin change or tenderness.     Left: Normal. No swelling, mass, nipple discharge, skin change or tenderness.  Abdominal:     General: There is no distension.     Palpations: Abdomen is soft.     Tenderness: There is no abdominal tenderness.  Genitourinary:    General: Normal vulva.     Exam position: Lithotomy position.     Urethra: No prolapse.     Vagina: Normal. No vaginal discharge or bleeding.     Cervix: Normal. No lesion.     Uterus: Normal. Not enlarged and not tender.      Adnexa: Right adnexa normal and left adnexa normal.  Musculoskeletal:        General: Normal range of motion.     Cervical back: Normal range of motion.  Lymphadenopathy:     Upper Body:     Right upper body: No axillary adenopathy.     Left upper body: No axillary adenopathy.     Lower Body: No right inguinal adenopathy. No left inguinal adenopathy.  Skin:    General: Skin is warm and dry.  Neurological:     General: No focal deficit present.     Mental Status: She is alert.  Psychiatric:        Mood and Affect: Mood normal.        Behavior: Behavior normal.       Assessment and Plan:        Well woman exam with routine gynecological exam Assessment & Plan: Cervical cancer screening performed according to ASCCP guidelines. Encouraged annual mammogram and breast MRI  screening Colonoscopy UTD DXA N/A Labs and immunizations with her primary Encouraged safe sexual  practices as indicated Encouraged healthy lifestyle practices with diet and exercise For patients under 50-70yo, I recommend 1200mg  calcium daily and 600IU of vitamin D  daily.    At high risk for breast cancer -     MR BREAST BILATERAL W WO CONTRAST INC CAD; Future  Encounter for breast cancer screening using non-mammogram modality -     MR BREAST BILATERAL W WO CONTRAST INC CAD; Future  Negative depression screening  OAB (overactive bladder) Assessment & Plan: Patient with OAB symptoms. Recommend medical management (including use of myrbetriq , gemtesa (okay with HTN), trospium, oxybutynin). Will start myrbetriq . Continue kegels. RTO in 3 months   Orders: -     Mirabegron  ER; Take 1 tablet (25 mg total) by mouth daily.  Dispense: 90 tablet; Refill: 3 -     Urinalysis,Complete w/RFL Culture  Genitourinary syndrome of menopause Assessment & Plan: Reviewed safety profile of low dose vaginal estrogen, however reviewed that higher doses have been associated with DVT, breast and uterine cancer.    Orders: -     Estradiol ; Apply 1/2 gram to vulva  nightly for 2 weeks then decrease to 1/2 gram to vulva two nights a week. Do not use applicator.  Dispense: 42.5 g; Refill: 1  Vasomotor symptoms due to menopause Assessment & Plan: Discussed nonhormonal options for VMS, including SSRIs, veozah, gabapetin. Patient elects for gabapentin , has used before postop pain. Reviewed side effects such as hypotension, mood irritability, GI upset, insomnia, and drowsiness. F/u in 3mon    Orders: -     Gabapentin ; Take 1 capsule (100 mg total) by mouth at bedtime for 3 days, THEN 2 capsules (200 mg total) at bedtime for 3 days, THEN 3 capsules (300 mg total) at bedtime.  Dispense: 270 capsule; Refill: 3    Romaine Closs, MD

## 2023-06-10 ENCOUNTER — Telehealth: Payer: Self-pay

## 2023-06-10 ENCOUNTER — Telehealth: Payer: Self-pay | Admitting: *Deleted

## 2023-06-10 NOTE — Telephone Encounter (Signed)
 A prior authorization has been submitted for patients medication:mirabegron  ER (MYRBETRIQ ) 25 MG TB24 tablet   Key: [id number]  Allow up to 72 hours for response.

## 2023-06-10 NOTE — Telephone Encounter (Signed)
 LVM for pt to call office.  Checking to see if she would like to reschedule her physical with Dr. Arabella Beach or if she would like to cancel her labs and wait till new provider starts.

## 2023-06-15 ENCOUNTER — Other Ambulatory Visit: Payer: 59

## 2023-06-22 ENCOUNTER — Encounter: Payer: 59 | Admitting: Family Medicine

## 2023-06-23 NOTE — Telephone Encounter (Signed)
 Initial prior authorization expired. Submitted new auth (Key: [id number])  Additional questions are needed to submit PA in [email or web address]. Questions are on Dr. Andrena Ke desk to review.

## 2023-06-25 NOTE — Telephone Encounter (Signed)
 Initial prior authorization expired. Submitted new auth (Key: [id number])  Additional questions are needed to submit PA in [email or web address]. Questions are on Dr. Andrena Ke desk to review.

## 2023-07-02 ENCOUNTER — Encounter: Payer: Self-pay | Admitting: Obstetrics and Gynecology

## 2023-07-02 NOTE — Telephone Encounter (Signed)
 Previous Key: [id number] had expired.  Resubmitted online Prior Authorization request today along with requested documentation.  Molli Angelucci (Key: [id number]) [email or web address]  Allow up to 72 hours for response.

## 2023-07-14 ENCOUNTER — Ambulatory Visit
Admission: RE | Admit: 2023-07-14 | Discharge: 2023-07-14 | Disposition: A | Discharge status: Home / Self Care | Source: Ambulatory Visit | Attending: Obstetrics and Gynecology

## 2023-07-14 DIAGNOSIS — Z9189 Other specified personal risk factors, not elsewhere classified: Secondary | ICD-10-CM

## 2023-07-14 DIAGNOSIS — Z1239 Encounter for other screening for malignant neoplasm of breast: Secondary | ICD-10-CM

## 2023-07-14 MED ORDER — GADOPICLENOL 0.5 MMOL/ML IV SOLN
10.0000 mL | Freq: Once | INTRAVENOUS | Status: AC | PRN
  Administered 2023-07-14: 10 mL via INTRAVENOUS

## 2023-07-15 ENCOUNTER — Ambulatory Visit: Payer: Self-pay | Admitting: Obstetrics and Gynecology

## 2023-07-27 ENCOUNTER — Other Ambulatory Visit: Payer: Self-pay | Admitting: *Deleted

## 2023-07-27 DIAGNOSIS — E782 Mixed hyperlipidemia: Secondary | ICD-10-CM

## 2023-07-27 DIAGNOSIS — E559 Vitamin D deficiency, unspecified: Secondary | ICD-10-CM

## 2023-07-28 ENCOUNTER — Other Ambulatory Visit

## 2023-07-28 DIAGNOSIS — E559 Vitamin D deficiency, unspecified: Secondary | ICD-10-CM

## 2023-07-28 DIAGNOSIS — E782 Mixed hyperlipidemia: Secondary | ICD-10-CM

## 2023-07-29 ENCOUNTER — Ambulatory Visit: Payer: Self-pay | Admitting: Family Medicine

## 2023-07-29 LAB — COMPREHENSIVE METABOLIC PANEL WITH GFR
ALT: 23 IU/L (ref 0–32)
AST: 21 IU/L (ref 0–40)
Albumin: 4.3 g/dL (ref 3.8–4.9)
Alkaline Phosphatase: 97 IU/L (ref 44–121)
BUN/Creatinine Ratio: 18 (ref 9–23)
BUN: 14 mg/dL (ref 6–24)
Bilirubin Total: 0.4 mg/dL (ref 0.0–1.2)
CO2: 25 mmol/L (ref 20–29)
Calcium: 9.3 mg/dL (ref 8.7–10.2)
Chloride: 103 mmol/L (ref 96–106)
Creatinine, Ser: 0.79 mg/dL (ref 0.57–1.00)
Globulin, Total: 2.3 g/dL (ref 1.5–4.5)
Glucose: 98 mg/dL (ref 70–99)
Potassium: 4.6 mmol/L (ref 3.5–5.2)
Sodium: 143 mmol/L (ref 134–144)
Total Protein: 6.6 g/dL (ref 6.0–8.5)
eGFR: 88 mL/min/{1.73_m2} (ref >59)

## 2023-07-29 LAB — LIPID PANEL
Chol/HDL Ratio: 3.8 ratio (ref 0.0–4.4)
Cholesterol, Total: 217 mg/dL — ABNORMAL HIGH (ref 100–199)
HDL: 57 mg/dL (ref >39)
LDL Chol Calc (NIH): 140 mg/dL — ABNORMAL HIGH (ref 0–99)
Triglycerides: 115 mg/dL (ref 0–149)
VLDL Cholesterol Cal: 20 mg/dL (ref 5–40)

## 2023-07-29 LAB — HEMOGLOBIN A1C
Est. average glucose Bld gHb Est-mCnc: 120 mg/dL
Hgb A1c MFr Bld: 5.8 % — ABNORMAL HIGH (ref 4.8–5.6)

## 2023-07-29 LAB — CBC WITH DIFFERENTIAL/PLATELET
Basophils Absolute: 0 10*3/uL (ref 0.0–0.2)
Basos: 0 %
EOS (ABSOLUTE): 0.2 10*3/uL (ref 0.0–0.4)
Eos: 2 %
Hematocrit: 42.8 % (ref 34.0–46.6)
Hemoglobin: 14 g/dL (ref 11.1–15.9)
Immature Grans (Abs): 0 10*3/uL (ref 0.0–0.1)
Immature Granulocytes: 0 %
Lymphocytes Absolute: 4.1 10*3/uL — ABNORMAL HIGH (ref 0.7–3.1)
Lymphs: 41 %
MCH: 29.5 pg (ref 26.6–33.0)
MCHC: 32.7 g/dL (ref 31.5–35.7)
MCV: 90 fL (ref 79–97)
Monocytes Absolute: 0.6 10*3/uL (ref 0.1–0.9)
Monocytes: 5 %
Neutrophils Absolute: 5.3 10*3/uL (ref 1.4–7.0)
Neutrophils: 52 %
Platelets: 349 10*3/uL (ref 150–450)
RBC: 4.75 x10E6/uL (ref 3.77–5.28)
RDW: 13.1 % (ref 11.7–15.4)
WBC: 10.2 10*3/uL (ref 3.4–10.8)

## 2023-07-29 LAB — TSH: TSH: 2.75 u[IU]/mL (ref 0.450–4.500)

## 2023-07-29 LAB — VITAMIN D 25 HYDROXY (VIT D DEFICIENCY, FRACTURES): Vit D, 25-Hydroxy: 61.1 ng/mL (ref 30.0–100.0)

## 2023-08-03 ENCOUNTER — Encounter: Payer: Self-pay | Admitting: Family Medicine

## 2023-08-03 ENCOUNTER — Ambulatory Visit (INDEPENDENT_AMBULATORY_CARE_PROVIDER_SITE_OTHER): Admitting: Family Medicine

## 2023-08-03 VITALS — BP 136/80 | HR 79 | Ht 61.0 in | Wt 232.0 lb

## 2023-08-03 DIAGNOSIS — E782 Mixed hyperlipidemia: Secondary | ICD-10-CM

## 2023-08-03 DIAGNOSIS — R7303 Prediabetes: Secondary | ICD-10-CM

## 2023-08-03 DIAGNOSIS — H6993 Unspecified Eustachian tube disorder, bilateral: Secondary | ICD-10-CM | POA: Diagnosis not present

## 2023-08-03 NOTE — Progress Notes (Signed)
   Established Patient Office Visit  Subjective   Patient ID: Baylea Milburn, female    DOB: 07-Mar-1965  Age: 58 y.o. MRN: [id number]  Chief Complaint  Patient presents with   Annual Exam    HPI  Subjective - Presents for annual physical - Swelling in right wrist for 1 month, not painful unless performing certain movements, feels knotty - Reports multiple ear infections since October, with symptoms of pressure similar to being in an airplane, vertigo when lying down - Denies ear pain - Previously treated for ear infections in October/November, January, and March/April - Reports cutting out soda and drinking more water for past 16 days - Started walk fit program on phone, 7-10 minutes daily of low impact exercise  Medications: gabapentin  300mg  2 tablets at night for menopausal symptoms, estrogen vaginal cream, vitamin D , B vitamin, multivitamin, omega-3 fatty acids  PMH: high cholesterol, menopausal symptoms, recurrent ear infections PSH: colonoscopy in 2023 FH: both parents with high cholesterol, father had heart attack in his 97s Social Hx: married, cares for grandchildren (14-year-old twins, 78-year-old), no tobacco use, occasional alcohol use  ROS: denies ear pain, reports ear pressure and vertigo when lying down   The 10-year ASCVD risk score (Arnett DK, et al., 2019) is: 2.6%  Health Maintenance Due  Topic Date Due   COVID-19 Vaccine (1 - 2024-25 season) Never done      Objective:     BP 136/80   Pulse 79   Ht 5' 1 (1.549 m)   Wt 232 lb (105.2 kg)   LMP 12/19/2015 Comment: sexually active  SpO2 96%   BMI 43.84 kg/m    Physical Exam General: well-appearing HEENT: TMs normal bilaterally MSK: palpable nodule at right wrist, consistent with lipoma or cyst CV: not examined PULM: clear to auscultation   No results found for any visits on 08/03/23.      Assessment & Plan:   Mixed hyperlipidemia Assessment & Plan:    - LDL previously as high as  173, improved on recent labs    - Discussed ASCVD risk score of 2.8%, no indication for statin therapy at this time    - previous CAC score was 0.     - Continue dietary modifications with increased salad intake and elimination of soda   Dysfunction of both eustachian tubes Assessment & Plan:   - Recommend OTC nasal corticosteroid spray (Flonase , Nasonex, or Nasacort ) daily for at least one week    - May use Afrin nasal spray for first 2-3 days only to decrease inflammation    - Consider ENT referral if symptoms persist despite treatment   Prediabetes Assessment & Plan:    - Discussed dietary changes including elimination of soda (previously drinking three 20oz sodas daily)    - Encouraged continued water intake (currently drinking three 20oz bottles daily)    - Continue exercise program      Return in about 6 months (around 02/02/2024) for hld.    Laneta Pintos, MD

## 2023-08-03 NOTE — Patient Instructions (Addendum)
 It was nice to see you today,  We addressed the following topics today: -I believe the bump on your wrist is a cyst.  If it gets any larger or becomes painful let us  know and I can ultrasound it - Your A1c was a little bit higher.  Please make sure you continue to cut out the sodas and other sources of sugar. - When we follow-up in 6 months we can check your cholesterol and A1c again. - I believe your ear congestion is caused by something called eustachian tube dysfunction.  This can be triggered by allergies.  I would recommend using Flonase  nasal spray daily for at least a week to see if this helps.  You can use other corticosteroid nasal sprays if you do not like Flonase .  Have a great day,  Etha Henle, MD

## 2023-08-04 DIAGNOSIS — R7303 Prediabetes: Secondary | ICD-10-CM | POA: Insufficient documentation

## 2023-08-04 DIAGNOSIS — H6990 Unspecified Eustachian tube disorder, unspecified ear: Secondary | ICD-10-CM | POA: Insufficient documentation

## 2023-08-04 NOTE — Assessment & Plan Note (Signed)
-   LDL previously as high as 161, improved on recent labs    - Discussed ASCVD risk score of 2.8%, no indication for statin therapy at this time    - previous CAC score was 0.     - Continue dietary modifications with increased salad intake and elimination of soda

## 2023-08-04 NOTE — Assessment & Plan Note (Signed)
-   Recommend OTC nasal corticosteroid spray (Flonase , Nasonex, or Nasacort ) daily for at least one week    - May use Afrin nasal spray for first 2-3 days only to decrease inflammation    - Consider ENT referral if symptoms persist despite treatment

## 2023-08-04 NOTE — Assessment & Plan Note (Signed)
-   Discussed dietary changes including elimination of soda (previously drinking three 20oz sodas daily)    - Encouraged continued water intake (currently drinking three 20oz bottles daily)    - Continue exercise program

## 2023-08-14 ENCOUNTER — Encounter: Payer: Self-pay | Admitting: Family Medicine

## 2023-08-17 ENCOUNTER — Other Ambulatory Visit: Payer: Self-pay | Admitting: Family Medicine

## 2023-08-17 DIAGNOSIS — M25431 Effusion, right wrist: Secondary | ICD-10-CM

## 2023-08-18 ENCOUNTER — Inpatient Hospital Stay
Admission: RE | Admit: 2023-08-18 | Discharge: 2023-08-18 | Discharge status: Home / Self Care | Source: Ambulatory Visit | Attending: Family Medicine

## 2023-08-18 DIAGNOSIS — M25431 Effusion, right wrist: Secondary | ICD-10-CM

## 2023-08-23 ENCOUNTER — Ambulatory Visit: Payer: Self-pay | Admitting: Family Medicine

## 2023-08-24 ENCOUNTER — Other Ambulatory Visit: Payer: Self-pay | Admitting: Family Medicine

## 2023-08-24 DIAGNOSIS — M25431 Effusion, right wrist: Secondary | ICD-10-CM

## 2023-09-03 ENCOUNTER — Other Ambulatory Visit (INDEPENDENT_AMBULATORY_CARE_PROVIDER_SITE_OTHER): Payer: Self-pay

## 2023-09-03 ENCOUNTER — Ambulatory Visit: Admitting: Orthopaedic Surgery

## 2023-09-03 ENCOUNTER — Encounter: Payer: Self-pay | Admitting: Orthopaedic Surgery

## 2023-09-03 DIAGNOSIS — M25531 Pain in right wrist: Secondary | ICD-10-CM

## 2023-09-03 MED ORDER — PREDNISONE 10 MG (21) PO TBPK: ORAL_TABLET | ORAL | 3 refills | Status: DC

## 2023-09-03 NOTE — Progress Notes (Signed)
 Office Visit Note   Patient: Angela Ramsey           Date of Birth: [birth year]           MRN: [id number] Visit Date: 09/03/2023              Requested by: Chandra Toribio POUR, MD [address] [place],  [place] [address] PCP: Chandra Toribio POUR, MD   Assessment & Plan: Visit Diagnoses:  1. Pain in right wrist     Plan: History of Present Illness Angela Ramsey is a 58 year old female who presents with right wrist pain and swelling for evaluation. She was referred by a medical doctor for evaluation of a suspected cyst in the right wrist.  She experiences pain and swelling on the dorsal aspect of her right wrist for nearly two months. The pain is rated as 4 out of 10 and worsens with wrist extension and resistance. There is no history of trauma to the wrist.  An ultrasound suggests tendinopathy. She was screened for rheumatoid arthritis four years ago, with a negative rheumatoid factor and a borderline result on another test.  She has used Aleve for pain management without relief. She has not used prednisone  or other steroids since the onset of symptoms. 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  Physical Exam MUSCULOSKELETAL: Right wrist with swelling on dorsal aspect. Non-tender to palpation, flexion, and extension. No crepitus. Resisted extension causes mild pain.  Results RADIOLOGY Right wrist X-ray: Normal structure, no fracture, no dislocation, no arthritis.  Assessment and Plan Right wrist tenosynovitis Chronic wrist pain and swelling with suspected extensor tendon tenosynovitis. X-rays normal, ultrasound suggested tendinopathy. Inflammation suspected. Previous rheumatoid factor and ANA tests negative or borderline. - Order repeat blood work for rheumatoid arthritis screening. - Prescribe wrist brace for two weeks immobilization. - Initiate prednisone  for inflammation reduction. - Advise symptom  monitoring and MyChart reporting if no improvement in two weeks. - Consider MRI if no improvement after two weeks.  Follow-Up Instructions: No follow-ups on file.   Orders:  Orders Placed This Encounter  Procedures   XR Wrist Complete Right   Uric acid   Sedimentation rate   ANA   Rheumatoid Factor   Meds ordered this encounter  Medications   predniSONE  (STERAPRED UNI-PAK 21 TAB) 10 MG (21) TBPK tablet    Sig: Take as directed    Dispense:  21 tablet    Refill:  3      Procedures: No procedures performed   Clinical Data: No additional findings.   Subjective: Chief Complaint  Patient presents with   Right Wrist - Pain    HPI  Review of Systems  Constitutional: Negative.   HENT: Negative.    Eyes: Negative.   Respiratory: Negative.    Cardiovascular: Negative.   Endocrine: Negative.   Musculoskeletal: Negative.   Neurological: Negative.   Hematological: Negative.   Psychiatric/Behavioral: Negative.    All other systems reviewed and are negative.    Objective: Vital Signs: LMP 12/19/2015 Comment: sexually active  Physical Exam Vitals and nursing note reviewed.  Constitutional:      Appearance: She is well-developed.  HENT:     Head: Atraumatic.     Nose: Nose normal.  Eyes:     Extraocular Movements: Extraocular movements intact.  Cardiovascular:     Pulses: Normal pulses.  Pulmonary:     Effort: Pulmonary effort is  normal.  Abdominal:     Palpations: Abdomen is soft.  Musculoskeletal:     Cervical back: Neck supple.  Skin:    General: Skin is warm.     Capillary Refill: Capillary refill takes less than 2 seconds.  Neurological:     Mental Status: She is alert. Mental status is at baseline.  Psychiatric:        Behavior: Behavior normal.        Thought Content: Thought content normal.        Judgment: Judgment normal.     Ortho Exam  Specialty Comments:  No specialty comments available.  Imaging: XR Wrist Complete Right Result  Date: 09/03/2023 X-rays demonstrate no acute or structural abnormalities    PMFS History: Patient Active Problem List   Diagnosis Date Noted   Eustachian tube dysfunction 08/04/2023   Prediabetes 08/04/2023   Well woman exam with routine gynecological exam 06/07/2023   OAB (overactive bladder) 06/07/2023   Genitourinary syndrome of menopause 06/07/2023   Vasomotor symptoms due to menopause 06/07/2023   Bilateral primary osteoarthritis of knee 02/22/2023   Migraine without aura and without status migrainosus, not intractable 07/01/2021   Temporal headache 12/02/2020   Jaw pain 12/02/2020   Vitamin D  insufficiency 05/02/2019   Insomnia 05/02/2019   Eczema 05/02/2019   Unspecified menopausal and perimenopausal disorder 05/18/2018   Hyperlipidemia 05/18/2018   mutation of ATM gene 04/26/2018   Hemorrhoids- small external nonthrombosed hemorrhoid perirectal area at 10 PM 02/24/2016   Environmental and seasonal allergies 12/01/2015   GAD (generalized anxiety disorder) 10/16/2015   obesity (HCC) 10/16/2015   h/o ovarian Endometriosis    Past Medical History:  Diagnosis Date   Anxiety 2000   Endometriosis    Headache    Heart murmur Child   as a child   Hyperlipidemia 2019   Hypertension 2020   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   ATM gene called c.7187C>G  mutation of unknown significance see genetic counseling note 09/21/2014    Family History  Problem Relation Age of Onset   Hypertension Mother    COPD Mother    Obesity Mother    Varicose Veins Mother    Hypertension Father    Hyperlipidemia Father    Stroke Father    Skin cancer Father    Cancer Father    Obesity Father    Thyroid cancer Sister        dx in her early to mid 75s   Cancer Sister        thyroid   Arthritis Sister    Obesity Sister    Breast cancer Maternal Aunt 57       mother's identical twin   Cancer Maternal Aunt        breast   Obesity Maternal Aunt    Breast cancer Maternal Aunt 61        second cancer at  27   Cancer Maternal Aunt        breast x 2   Breast cancer Paternal Aunt 72   Breast cancer Maternal Grandmother        dx in her late 42s to early 38s   Cancer Maternal Grandmother        breast   Obesity Maternal Grandmother    Cancer Maternal Grandfather        lung   Diabetes Paternal Grandmother    Leukemia Paternal Grandfather    Cancer Paternal Grandfather        leukemia  Cancer Paternal Aunt    Arthritis Sister    Cancer Paternal Aunt    Colon cancer Neg Hx    Esophageal cancer Neg Hx    Rectal cancer Neg Hx    Stomach cancer Neg Hx    Colon polyps Neg Hx    Crohn's disease Neg Hx     Past Surgical History:  Procedure Laterality Date   BREAST BIOPSY Left 2016   BREAST SURGERY  2016   Breast Bx benign   COLONOSCOPY     FOOT SURGERY Left    plantar fasciitis   NOVASURE ABLATION  10/2004   PELVIC LAPAROSCOPY  12/2002   endometriosis   POLYPECTOMY     Social History   Occupational History   Not on file  Tobacco Use   Smoking status: Never    Passive exposure: Never   Smokeless tobacco: Never  Vaping Use   Vaping status: Never Used  Substance and Sexual Activity   Alcohol use: Yes    Comment: less than 1 a week   Drug use: No   Sexual activity: Yes    Partners: Male    Birth control/protection: Post-menopausal, Surgical    Comment: 1st intercourse 15 yo-5 partners, husband vasectomy, ablation

## 2023-09-06 ENCOUNTER — Ambulatory Visit (INDEPENDENT_AMBULATORY_CARE_PROVIDER_SITE_OTHER): Admitting: Obstetrics and Gynecology

## 2023-09-06 ENCOUNTER — Encounter: Payer: Self-pay | Admitting: Obstetrics and Gynecology

## 2023-09-06 VITALS — BP 118/72 | HR 68 | Temp 98.0°F | Wt 226.0 lb

## 2023-09-06 DIAGNOSIS — N951 Menopausal and female climacteric states: Secondary | ICD-10-CM

## 2023-09-06 DIAGNOSIS — N958 Other specified menopausal and perimenopausal disorders: Secondary | ICD-10-CM

## 2023-09-06 LAB — URIC ACID: Uric Acid, Serum: 6.5 mg/dL (ref 2.5–7.0)

## 2023-09-06 LAB — RHEUMATOID FACTOR: Rheumatoid fact SerPl-aCnc: <10 [IU]/mL (ref <14)

## 2023-09-06 LAB — SEDIMENTATION RATE: Sed Rate: 6 mm/h (ref 0–30)

## 2023-09-06 LAB — ANA: Anti Nuclear Antibody (ANA): NEGATIVE

## 2023-09-06 NOTE — Assessment & Plan Note (Addendum)
 Continue estrace

## 2023-09-06 NOTE — Progress Notes (Signed)
 58 y.o. G74P2002 female s/p Endometrial ablation with family h/o Breast Ca (neg genetic testing Aug 2016, high risk, annual MRI), GSM, VMS, OAB here for med f/u. Married. Homemaker.  Patient's last menstrual period was 12/19/2015.   Annual exam, she reported: Urinary leakage and urgency. Voids 6-8xday. Nocturia x1. Completed PFPT with urology years ago. Stays hot a lot, has night sweats, sleep is fan nightly  She was started on gabapentin  300 mg at night and vaginal estrogen.  She reports that gabapentin , working well for her. No night sweats.  Has chronic ear infections and noted worsening vertigo with gabapentin  300 mg at night.  Decrease her dose to 200 mg at night and has been doing well. Insurance denied Myrbetriq .  Does not want to start oxybutynin like medications.  Urinary symptoms improved with increased walking.  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     Cholecalciferol (VITAMIN D ) 50 MCG (2000 UT) tablet Take 1 tablet (2,000 Units total) by mouth daily.     Collagen-Vitamin C-Biotin (COLLAGEN 1500/C PO) Take by mouth daily.     estradiol  (ESTRACE  VAGINAL) 0.1 MG/GM vaginal cream Apply 1/2 gram to vulva nightly for 2 weeks then decrease to 1/2 gram to vulva two nights a week. Do not use applicator. 42.5 g 1   gabapentin  (NEURONTIN ) 100 MG capsule Take 1 capsule (100 mg total) by mouth at bedtime for 3 days, THEN 2 capsules (200 mg total) at bedtime for 3 days, THEN 3 capsules (300 mg total) at bedtime. 270 capsule 3   magnesium gluconate (MAGONATE) 500 (27 Mg) MG TABS tablet Take 200 mg by mouth daily.     Multiple Vitamins-Minerals (ALIVE MULTI-VITAMIN) CHEW Chew by mouth daily.     NON FORMULARY daily.     Omega-3 Fatty Acids (FISH OIL PO) Take 1 capsule by mouth daily. Unsure of dosage     predniSONE  (STERAPRED UNI-PAK 21 TAB) 10 MG (21) TBPK tablet Take as directed 21 tablet 3   No current facility-administered medications on file prior to visit.  No Known Allergies    PE Today's Vitals   09/06/23 1109  BP: 118/72  Pulse: 68  Temp: 98 F (36.7 C)  TempSrc: Oral  SpO2: 98%  Weight: 226 lb (102.5 kg)   Body mass index is 42.7 kg/m.  Physical Exam Vitals reviewed.  Constitutional:      General: She is not in acute distress.    Appearance: Normal appearance.  HENT:     Head: Normocephalic and atraumatic.     Nose: Nose normal.  Eyes:     Extraocular Movements: Extraocular movements intact.     Conjunctiva/sclera: Conjunctivae normal.  Pulmonary:     Effort: Pulmonary effort is normal.  Musculoskeletal:        General: Normal range of motion.     Cervical back: Normal range of motion.  Neurological:     General: No focal deficit present.     Mental Status: She is alert.  Psychiatric:        Mood and Affect: Mood normal.        Behavior: Behavior normal.       Assessment and Plan:        Vasomotor symptoms due to menopause Assessment & Plan: Continue 200mg  gabapentin  nightly. RTO for annual   Genitourinary syndrome of menopause Assessment & Plan: Continue estrace   RTO for annual  Vera LULLA Pa, MD

## 2023-09-06 NOTE — Assessment & Plan Note (Addendum)
 Continue 200mg  gabapentin  nightly. RTO for annual

## 2023-09-20 ENCOUNTER — Encounter: Payer: Self-pay | Admitting: Orthopaedic Surgery

## 2023-09-20 NOTE — Telephone Encounter (Signed)
 Please order MRI of wrist.  Thanks.

## 2023-09-21 ENCOUNTER — Ambulatory Visit: Admitting: Orthopaedic Surgery

## 2023-09-21 ENCOUNTER — Other Ambulatory Visit: Payer: Self-pay

## 2023-09-21 DIAGNOSIS — M25531 Pain in right wrist: Secondary | ICD-10-CM

## 2023-09-23 ENCOUNTER — Encounter: Payer: Self-pay | Admitting: Orthopaedic Surgery

## 2023-09-25 ENCOUNTER — Ambulatory Visit
Admission: RE | Admit: 2023-09-25 | Discharge: 2023-09-25 | Disposition: A | Discharge status: Home / Self Care | Source: Ambulatory Visit | Attending: Orthopaedic Surgery | Admitting: Orthopaedic Surgery

## 2023-09-25 DIAGNOSIS — M25531 Pain in right wrist: Secondary | ICD-10-CM

## 2023-10-08 ENCOUNTER — Ambulatory Visit (INDEPENDENT_AMBULATORY_CARE_PROVIDER_SITE_OTHER): Admitting: Orthopaedic Surgery

## 2023-10-08 ENCOUNTER — Encounter: Payer: Self-pay | Admitting: Orthopaedic Surgery

## 2023-10-08 DIAGNOSIS — M25531 Pain in right wrist: Secondary | ICD-10-CM

## 2023-10-08 NOTE — Progress Notes (Signed)
 Office Visit Note   Patient: Angela Ramsey           Date of Birth: Apr 01, 1965           MRN: [id number] Visit Date: 10/08/2023              Requested by: Chandra Toribio POUR, MD [address] [place],  [place] [address] PCP: Chandra Toribio POUR, MD   Assessment & Plan: Visit Diagnoses:  1. Pain in right wrist     Plan: History of Present Illness Angela Ramsey is a 58 year old female who presents with persistent hand pain and swelling.  She is here for MRI review.  She continues to report pain and swelling along the dorsal aspect of the forearm and occasional radiating pain along the ulnar aspect.  Exam of the right wrist and forearm are unchanged from prior visit.  Results RADIOLOGY Hand MRI: Arthritis at the [facility] base of the thumb, mild tendinitis of the ECU, mild irregularity of the triangular fibrocartilage complex (TFCC), no structural abnormalities.  Assessment and Plan Right wrist and thumb pain and swelling with carpometacarpal joint arthritis, tendinitis, and TFCC irregularity Chronic pain and swelling with MRI findings of carpometacarpal joint arthritis, mild tendinitis, and mild TFCC irregularity. Possible PIN nerve compression.  - Continue brace use for comfort. - Consider referral to hand surgeon Dr. Bethanne for further evaluation if desired.  Follow-Up Instructions: No follow-ups on file.   Orders:  No orders of the defined types were placed in this encounter.  No orders of the defined types were placed in this encounter.     Procedures: No procedures performed   Clinical Data: No additional findings.   Subjective: Chief Complaint  Patient presents with   Right Wrist - Follow-up    MRI review    HPI  Review of Systems  Constitutional: Negative.   HENT: Negative.    Eyes: Negative.   Respiratory: Negative.    Cardiovascular: Negative.   Endocrine: Negative.   Musculoskeletal: Negative.   Neurological: Negative.   Hematological:  Negative.   Psychiatric/Behavioral: Negative.    All other systems reviewed and are negative.    Objective: Vital Signs: LMP 12/19/2015 Comment: sexually active  Physical Exam Vitals and nursing note reviewed.  Constitutional:      Appearance: She is well-developed.  HENT:     Head: Atraumatic.     Nose: Nose normal.  Eyes:     Extraocular Movements: Extraocular movements intact.  Cardiovascular:     Pulses: Normal pulses.  Pulmonary:     Effort: Pulmonary effort is normal.  Abdominal:     Palpations: Abdomen is soft.  Musculoskeletal:     Cervical back: Neck supple.  Skin:    General: Skin is warm.     Capillary Refill: Capillary refill takes less than 2 seconds.  Neurological:     Mental Status: She is alert. Mental status is at baseline.  Psychiatric:        Behavior: Behavior normal.        Thought Content: Thought content normal.        Judgment: Judgment normal.     Ortho Exam  Specialty Comments:  No specialty comments available.  Imaging: No results found.   PMFS History: Patient Active Problem List   Diagnosis Date Noted   Eustachian tube dysfunction 08/04/2023   Prediabetes 08/04/2023   Well woman exam with routine gynecological exam 06/07/2023   OAB (overactive bladder) 06/07/2023   Genitourinary  syndrome of menopause 06/07/2023   Vasomotor symptoms due to menopause 06/07/2023   Bilateral primary osteoarthritis of knee 02/22/2023   Migraine without aura and without status migrainosus, not intractable 07/01/2021   Temporal headache 12/02/2020   Jaw pain 12/02/2020   Vitamin D  insufficiency 05/02/2019   Insomnia 05/02/2019   Eczema 05/02/2019   Unspecified menopausal and perimenopausal disorder 05/18/2018   Hyperlipidemia 05/18/2018   mutation of ATM gene 04/26/2018   Hemorrhoids- small external nonthrombosed hemorrhoid perirectal area at 10 PM 02/24/2016   Environmental and seasonal allergies 12/01/2015   GAD (generalized anxiety disorder)  10/16/2015   obesity (HCC) 10/16/2015   h/o ovarian Endometriosis    Past Medical History:  Diagnosis Date   Anxiety 2000   Endometriosis    Headache    Heart murmur Child   as a child   Hyperlipidemia 2019   Hypertension 2020   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   ATM gene called c.7187C>G  mutation of unknown significance see genetic counseling note 09/21/2014   Tendon pain     Family History  Problem Relation Age of Onset   Hypertension Mother    COPD Mother    Obesity Mother    Varicose Veins Mother    Hypertension Father    Hyperlipidemia Father    Stroke Father    Skin cancer Father    Cancer Father    Obesity Father    Thyroid cancer Sister        dx in her early to mid 34s   Cancer Sister        thyroid   Arthritis Sister    Obesity Sister    Breast cancer Maternal Aunt 66       mother's identical twin   Cancer Maternal Aunt        breast   Obesity Maternal Aunt    Breast cancer Maternal Aunt 82       second cancer at  69   Cancer Maternal Aunt        breast x 2   Breast cancer Paternal Aunt 52   Breast cancer Maternal Grandmother        dx in her late 53s to early 6s   Cancer Maternal Grandmother        breast   Obesity Maternal Grandmother    Cancer Maternal Grandfather        lung   Diabetes Paternal Grandmother    Leukemia Paternal Grandfather    Cancer Paternal Grandfather        leukemia   Cancer Paternal Aunt    Arthritis Sister    Cancer Paternal Aunt    Colon cancer Neg Hx    Esophageal cancer Neg Hx    Rectal cancer Neg Hx    Stomach cancer Neg Hx    Colon polyps Neg Hx    Crohn's disease Neg Hx     Past Surgical History:  Procedure Laterality Date   BREAST BIOPSY Left 2016   BREAST SURGERY  2016   Breast Bx benign   COLONOSCOPY     FOOT SURGERY Left    plantar fasciitis   NOVASURE ABLATION  10/2004   PELVIC LAPAROSCOPY  12/2002   endometriosis   POLYPECTOMY     Social History   Occupational History   Not on  file  Tobacco Use   Smoking status: Never    Passive exposure: Never   Smokeless tobacco: Never  Vaping Use   Vaping status: Never  Used  Substance and Sexual Activity   Alcohol use: Yes    Comment: less than 1 a week   Drug use: No   Sexual activity: Yes    Partners: Male    Birth control/protection: Post-menopausal, Surgical    Comment: 1st intercourse 38 yo-5 partners, husband vasectomy, ablation

## 2023-11-01 ENCOUNTER — Ambulatory Visit (INDEPENDENT_AMBULATORY_CARE_PROVIDER_SITE_OTHER): Admitting: Orthopedic Surgery

## 2023-11-01 DIAGNOSIS — M7711 Lateral epicondylitis, right elbow: Secondary | ICD-10-CM | POA: Diagnosis not present

## 2023-11-01 DIAGNOSIS — M65839 Other synovitis and tenosynovitis, unspecified forearm: Secondary | ICD-10-CM

## 2023-11-01 NOTE — Progress Notes (Signed)
 Angela Ramsey - 58 y.o. female MRN [id number]  Date of birth: 01-Oct-1965  Office Visit Note: Visit Date: 11/01/2023 PCP: Chandra Toribio POUR, MD Referred by: Chandra Toribio POUR, MD  Subjective: No chief complaint on file.  HPI: Angela Ramsey is a pleasant 58 y.o. female who presents today for evaluation of ongoing right upper extremity swelling and pain, pain radiating from the right elbow to the hand region.  She is here today for specific hand surgical evaluation as a referral from my partner Dr. Jerri.  She underwent prior x-ray and MRI workup as well.  Has not undergone any prior formalized treatments or injections.  Pertinent ROS were reviewed with the patient and found to be negative unless otherwise specified above in HPI.   Visit Reason: Right wrist pain Duration of symptoms: 4 months Hand dominance: right Occupation: home maker Diabetic: No Smoking: No Heart/Lung History: none Blood Thinners: none  Prior Testing/EMG: MRI, XR, US  Injections (Date): None Treatments: Prednisone  and brace- made worse  Prior Surgery: none  Assessment & Plan: Visit Diagnoses:  1. Lateral epicondylitis, right elbow   2. Intersection syndrome of wrist     Plan: Based on examination today, this appears to be consistent with lateral epicondylitis and potentially some intersection syndrome at the forearm level.  For the time being, I would like her to be fitted for a counterforce brace.  I also did explain to her that we could have her to be seen by occupational therapy for range of motion and strengthening exercise moving forward should her symptoms continue to evolve or worsen.  We discussed the underlying etiology and pathophysiology of this condition as well as the appropriate timeframe for healing.  She expressed full understanding.  Counterforce brace was applied today, follow-up as needed.   Follow-up: No follow-ups on file.   Meds & Orders: No orders of the defined types were  placed in this encounter.  No orders of the defined types were placed in this encounter.    Procedures: No procedures performed      Clinical History: No specialty comments available.  She reports that she has never smoked. She has never been exposed to tobacco smoke. She has never used smokeless tobacco.  Recent Labs    07/28/23 1025 09/03/23 0959  HGBA1C 5.8*  --   LABURIC  --  6.5    Objective:   Vital Signs: LMP 12/19/2015 Comment: sexually active  Physical Exam  Gen: Well-appearing, in no acute distress; non-toxic CV: Regular Rate. Well-perfused. Warm.  Resp: Breathing unlabored on room air; no wheezing. Psych: Fluid speech in conversation; appropriate affect; normal thought process  Ortho Exam Right upper extremity: - Tenderness over the lateral epicondylar region, pain with resisted wrist extension and long finger extension - Mild pain to deep palpation just proximal to the wrist crease, 2 cm proximal to Lister's tubercle - Able to perform composite fist, full extension of all digits without restriction - AIN/PIN/interosseous intact, hand remains warm well-perfused, sensation intact in all distributions median/radial/ulnar  Imaging: No results found.  Past Medical/Family/Surgical/Social History: Medications & Allergies reviewed per EMR, new medications updated. Patient Active Problem List   Diagnosis Date Noted   Eustachian tube dysfunction 08/04/2023   Prediabetes 08/04/2023   Well woman exam with routine gynecological exam 06/07/2023   OAB (overactive bladder) 06/07/2023   Genitourinary syndrome of menopause 06/07/2023   Vasomotor symptoms due to menopause 06/07/2023   Bilateral primary osteoarthritis of knee 02/22/2023   Migraine without  aura and without status migrainosus, not intractable 07/01/2021   Temporal headache 12/02/2020   Jaw pain 12/02/2020   Vitamin D  insufficiency 05/02/2019   Insomnia 05/02/2019   Eczema 05/02/2019   Unspecified  menopausal and perimenopausal disorder 05/18/2018   Hyperlipidemia 05/18/2018   mutation of ATM gene 04/26/2018   Hemorrhoids- small external nonthrombosed hemorrhoid perirectal area at 10 PM 02/24/2016   Environmental and seasonal allergies 12/01/2015   GAD (generalized anxiety disorder) 10/16/2015   obesity (HCC) 10/16/2015   h/o ovarian Endometriosis    Past Medical History:  Diagnosis Date   Anxiety 2000   Endometriosis    Headache    Heart murmur Child   as a child   Hyperlipidemia 2019   Hypertension 2020   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   ATM gene called c.7187C>G  mutation of unknown significance see genetic counseling note 09/21/2014   Tendon pain    Family History  Problem Relation Age of Onset   Hypertension Mother    COPD Mother    Obesity Mother    Varicose Veins Mother    Hypertension Father    Hyperlipidemia Father    Stroke Father    Skin cancer Father    Cancer Father    Obesity Father    Thyroid cancer Sister        dx in her early to mid 40s   Cancer Sister        thyroid   Arthritis Sister    Obesity Sister    Breast cancer Maternal Aunt 30       mother's identical twin   Cancer Maternal Aunt        breast   Obesity Maternal Aunt    Breast cancer Maternal Aunt 25       second cancer at  85   Cancer Maternal Aunt        breast x 2   Breast cancer Paternal Aunt 35   Breast cancer Maternal Grandmother        dx in her late 73s to early 50s   Cancer Maternal Grandmother        breast   Obesity Maternal Grandmother    Cancer Maternal Grandfather        lung   Diabetes Paternal Grandmother    Leukemia Paternal Grandfather    Cancer Paternal Grandfather        leukemia   Cancer Paternal Aunt    Arthritis Sister    Cancer Paternal Aunt    Colon cancer Neg Hx    Esophageal cancer Neg Hx    Rectal cancer Neg Hx    Stomach cancer Neg Hx    Colon polyps Neg Hx    Crohn's disease Neg Hx    Past Surgical History:  Procedure  Laterality Date   BREAST BIOPSY Left 2016   BREAST SURGERY  2016   Breast Bx benign   COLONOSCOPY     FOOT SURGERY Left    plantar fasciitis   NOVASURE ABLATION  10/2004   PELVIC LAPAROSCOPY  12/2002   endometriosis   POLYPECTOMY     Social History   Occupational History   Not on file  Tobacco Use   Smoking status: Never    Passive exposure: Never   Smokeless tobacco: Never  Vaping Use   Vaping status: Never Used  Substance and Sexual Activity   Alcohol use: Yes    Comment: less than 1 a week   Drug use: No  Sexual activity: Yes    Partners: Male    Birth control/protection: Post-menopausal, Surgical    Comment: 1st intercourse 48 yo-5 partners, husband vasectomy, ablation    Keiondre Colee Estela) Arlinda, M.D. [facility], Hand Surgery

## 2023-11-11 ENCOUNTER — Other Ambulatory Visit: Payer: Self-pay | Admitting: Obstetrics and Gynecology

## 2023-11-11 DIAGNOSIS — Z1231 Encounter for screening mammogram for malignant neoplasm of breast: Secondary | ICD-10-CM

## 2023-11-30 ENCOUNTER — Ambulatory Visit
Admission: RE | Admit: 2023-11-30 | Discharge: 2023-11-30 | Disposition: A | Discharge status: Home / Self Care | Source: Ambulatory Visit | Attending: Obstetrics and Gynecology | Admitting: Obstetrics and Gynecology

## 2023-11-30 DIAGNOSIS — Z1231 Encounter for screening mammogram for malignant neoplasm of breast: Secondary | ICD-10-CM

## 2023-12-03 ENCOUNTER — Ambulatory Visit: Payer: Self-pay | Admitting: Obstetrics and Gynecology

## 2023-12-03 ENCOUNTER — Other Ambulatory Visit: Payer: Self-pay | Admitting: Obstetrics and Gynecology

## 2023-12-03 DIAGNOSIS — R928 Other abnormal and inconclusive findings on diagnostic imaging of breast: Secondary | ICD-10-CM

## 2023-12-11 ENCOUNTER — Ambulatory Visit

## 2023-12-11 ENCOUNTER — Ambulatory Visit
Admission: RE | Admit: 2023-12-11 | Discharge: 2023-12-11 | Disposition: A | Discharge status: Home / Self Care | Source: Ambulatory Visit | Attending: Obstetrics and Gynecology | Admitting: Obstetrics and Gynecology

## 2023-12-11 DIAGNOSIS — R928 Other abnormal and inconclusive findings on diagnostic imaging of breast: Secondary | ICD-10-CM

## 2023-12-14 ENCOUNTER — Ambulatory Visit: Payer: Self-pay | Admitting: Obstetrics and Gynecology

## 2023-12-20 ENCOUNTER — Encounter: Payer: Self-pay | Admitting: Radiology

## 2024-01-21 ENCOUNTER — Other Ambulatory Visit: Payer: Self-pay | Admitting: Family Medicine

## 2024-01-21 DIAGNOSIS — E782 Mixed hyperlipidemia: Secondary | ICD-10-CM

## 2024-01-21 DIAGNOSIS — D72820 Lymphocytosis (symptomatic): Secondary | ICD-10-CM

## 2024-01-21 DIAGNOSIS — R7303 Prediabetes: Secondary | ICD-10-CM

## 2024-01-26 ENCOUNTER — Other Ambulatory Visit

## 2024-01-26 DIAGNOSIS — E782 Mixed hyperlipidemia: Secondary | ICD-10-CM

## 2024-01-26 DIAGNOSIS — D72820 Lymphocytosis (symptomatic): Secondary | ICD-10-CM

## 2024-01-26 DIAGNOSIS — R7303 Prediabetes: Secondary | ICD-10-CM

## 2024-01-27 ENCOUNTER — Ambulatory Visit: Payer: Self-pay | Admitting: Family Medicine

## 2024-01-27 LAB — COMPREHENSIVE METABOLIC PANEL WITH GFR
ALT: 17 IU/L (ref 0–32)
AST: 20 IU/L (ref 0–40)
Albumin: 4.1 g/dL (ref 3.8–4.9)
Alkaline Phosphatase: 106 IU/L (ref 49–135)
BUN/Creatinine Ratio: 18 (ref 9–23)
BUN: 14 mg/dL (ref 6–24)
Bilirubin Total: 0.4 mg/dL (ref 0.0–1.2)
CO2: 28 mmol/L (ref 20–29)
Calcium: 9.2 mg/dL (ref 8.7–10.2)
Chloride: 102 mmol/L (ref 96–106)
Creatinine, Ser: 0.79 mg/dL (ref 0.57–1.00)
Globulin, Total: 2.7 g/dL (ref 1.5–4.5)
Glucose: 98 mg/dL (ref 70–99)
Potassium: 4.3 mmol/L (ref 3.5–5.2)
Sodium: 142 mmol/L (ref 134–144)
Total Protein: 6.8 g/dL (ref 6.0–8.5)
eGFR: 88 mL/min/1.73 (ref >59)

## 2024-01-27 LAB — LIPID PANEL
Chol/HDL Ratio: 3.7 ratio (ref 0.0–4.4)
Cholesterol, Total: 212 mg/dL — ABNORMAL HIGH (ref 100–199)
HDL: 58 mg/dL (ref >39)
LDL Chol Calc (NIH): 139 mg/dL — ABNORMAL HIGH (ref 0–99)
Triglycerides: 82 mg/dL (ref 0–149)
VLDL Cholesterol Cal: 15 mg/dL (ref 5–40)

## 2024-01-27 LAB — CBC WITH DIFFERENTIAL/PLATELET
Basophils Absolute: 0 x10E3/uL (ref 0.0–0.2)
Basos: 0 %
EOS (ABSOLUTE): 0.2 x10E3/uL (ref 0.0–0.4)
Eos: 2 %
Hematocrit: 42.8 % (ref 34.0–46.6)
Hemoglobin: 13.6 g/dL (ref 11.1–15.9)
Immature Grans (Abs): 0 x10E3/uL (ref 0.0–0.1)
Immature Granulocytes: 0 %
Lymphocytes Absolute: 4.3 x10E3/uL — ABNORMAL HIGH (ref 0.7–3.1)
Lymphs: 42 %
MCH: 28.6 pg (ref 26.6–33.0)
MCHC: 31.8 g/dL (ref 31.5–35.7)
MCV: 90 fL (ref 79–97)
Monocytes Absolute: 0.5 x10E3/uL (ref 0.1–0.9)
Monocytes: 5 %
Neutrophils Absolute: 5.4 x10E3/uL (ref 1.4–7.0)
Neutrophils: 51 %
Platelets: 353 x10E3/uL (ref 150–450)
RBC: 4.75 x10E6/uL (ref 3.77–5.28)
RDW: 13.1 % (ref 11.7–15.4)
WBC: 10.4 x10E3/uL (ref 3.4–10.8)

## 2024-01-27 LAB — HEMOGLOBIN A1C
Est. average glucose Bld gHb Est-mCnc: 117 mg/dL
Hgb A1c MFr Bld: 5.7 % — ABNORMAL HIGH (ref 4.8–5.6)

## 2024-02-02 ENCOUNTER — Encounter: Payer: Self-pay | Admitting: Family Medicine

## 2024-02-02 ENCOUNTER — Ambulatory Visit: Admitting: Family Medicine

## 2024-02-02 DIAGNOSIS — D72820 Lymphocytosis (symptomatic): Secondary | ICD-10-CM

## 2024-02-02 NOTE — Progress Notes (Unsigned)
° °  Established Patient Office Visit  Subjective   Patient ID: Angela Ramsey, female    DOB: 1966-02-07  Age: 58 y.o. MRN: [id number]  Chief Complaint  Patient presents with   Medical Management of Chronic Issues    HPI  212  Subjective - Weight management: Reports a 20-pound weight loss over the last 6 months but is dissatisfied with the rate of loss. The weight loss has slowed in the last couple of months. Reports walking 2 miles daily and using a stepper machine. Walking on a treadmill is limited to 10 minutes due to knee pain. Diet consists of healthier foods, including grilled chicken. Has completely eliminated soda. Drinks water and occasionally orange juice. Has tried a lemon juice diet in the past but finds it unsustainable. Expresses a desire to lose 4-8 pounds per month. Reports knee pain is the worst pain experienced.  Medications Takes several vitamins. Takes gabapentin  for night sweats, which has been effective.  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 No other significant past medical history, past surgical history, or family history mentioned. Socially, describes self as a home body and prefers not to go to in-person programs.  ROS Constitutional: Denies fevers, night sweats. Musculoskeletal: Reports knee pain, which is worse with treadmill use. Psychiatric: Reports stress related to dieting.  Objective Labs reviewed from 07/2023: - A1c is slightly improved. - Cholesterol is stable, but has been lower in recent years compared to 3-4 years ago. - LFTs are stable. - A lab value has been elevated for approximately one year, fluctuating between 3 and 4.2. It is not currently climbing.     The 10-year ASCVD risk score (Arnett DK, et al., 2019) is: 1.8%  Health Maintenance Due  Topic Date Due   Hepatitis B Vaccines 19-59 Average Risk (1 of 3 - 19+ 3-dose series) Never done   COVID-19 Vaccine (1 - 2025-26 season) Never done       Objective:     BP 115/78   Pulse 71   Ht 5' 1 (1.549 m)   Wt 212 lb 6.4 oz (96.3 kg)   LMP 12/19/2015 Comment: sexually active  SpO2 98%   BMI 40.13 kg/m  {Vitals History (Optional):23777}  Physical Exam Gen: alert, oriented Pulm: no respiratory distress Psych: pleasant affect   No results found for any visits on 02/02/24.      Assessment & Plan:   obesity [facility]) Assessment & Plan: The patient is motivated to lose weight but is frustrated with the slow progress. Current efforts include diet modification to focus on healthier food options and increased physical activity.  - Discussed various weight loss strategies, including calorie counting apps (e.g., MyFitnessPal, Lose It, Chronometer) to help with calorie counting, online weight counseling, healthy weight and wellness referral - Advised to take a daily multivitamin while dieting. - Will continue to monitor weight at follow-up visits.   Elevated lymphocyte count Assessment & Plan: Most recent lymphocyte values mildly elevated at > 4.  Has fluctuated between normal and mildly high over the past several years . If the same or higher at next visit will order flow cytometry      Return in about 6 months (around 08/02/2024) for physical.    Toribio MARLA Slain, MD

## 2024-02-02 NOTE — Assessment & Plan Note (Signed)
 The patient is motivated to lose weight but is frustrated with the slow progress. Current efforts include diet modification to focus on healthier food options and increased physical activity.  - Discussed various weight loss strategies, including calorie counting apps (e.g., MyFitnessPal, Lose It, Chronometer) to help with calorie counting, online weight counseling, healthy weight and wellness referral - Advised to take a daily multivitamin while dieting. - Will continue to monitor weight at follow-up visits.

## 2024-02-02 NOTE — Patient Instructions (Signed)
 It was nice to see you today,  We addressed the following topics today:  Please research online weight loss programs and calorie counting applications to find one that is sustainable for you. - Continue your current efforts with diet and exercise. - Please schedule a follow-up appointment in 6 months for a physical exam and to recheck labs. - some examples of calorie counting apps are: myfitnesspal, lose it!, cronometer  Have a great day,  Rolan Slain, MD

## 2024-02-02 NOTE — Assessment & Plan Note (Signed)
 Most recent lymphocyte values mildly elevated at > 4.  Has fluctuated between normal and mildly high over the past several years . If the same or higher at next visit will order flow cytometry

## 2024-02-18 ENCOUNTER — Ambulatory Visit: Payer: Self-pay

## 2024-02-18 NOTE — Telephone Encounter (Signed)
 FYI Only or Action Required?: FYI only for provider: appointment scheduled on 02/21/24.  Patient was last seen in primary care on 02/02/2024 by Chandra Toribio POUR, MD.  Called Nurse Triage reporting Dizziness.  Symptoms began several days ago.  Interventions attempted: Rest, hydration, or home remedies.  Symptoms are: unchanged.  Triage Disposition: See Physician Within 24 Hours  Patient/caregiver understands and will follow disposition?: Yes  Reason for Disposition  [1] MODERATE dizziness (e.g., vertigo; feels very unsteady, interferes with normal activities) AND [2] has NOT been evaluated by doctor (or NP/PA) for this  Answer Assessment - Initial Assessment Questions Office visit scheduled, advised to go to UC if symptoms worsen.   1. DESCRIPTION: Describe your dizziness.     Woozy  2. VERTIGO: Do you feel like either you or the room is spinning or tilting?      Yes  3. LIGHTHEADED: Do you feel lightheaded? (e.g., somewhat faint, woozy, weak upon standing)     Yes  4. SEVERITY: How bad is it?  Can you walk?     Can walk,  5. ONSET:  When did the dizziness begin?     Sunday, then subsided but came back on Tuesday and has not been continuous  6. AGGRAVATING FACTORS: Does anything make it worse? (e.g., standing, change in head position)     Standing, changing position  7. CAUSE: What do you think is causing the dizziness?     Vertigo, possibly from ear infection  8. RECURRENT SYMPTOM: Have you had dizziness before? If Yes, ask: When was the last time? What happened that time?     Yes, about a year ago and it was due to ear infection  9. OTHER SYMPTOMS: Do you have any other symptoms? (e.g., earache, headache, numbness, tinnitus, vomiting, weakness)     Headaches, denies numbness/tingling, vomiting, earache  10. PREGNANCY: Is there any chance you are pregnant? When was your last menstrual period?       NA  Protocols used: Dizziness -  Vertigo-A-AH  Copied from CRM [id number]. Topic: Clinical - Red Word Triage >> Feb 18, 2024 10:47 AM Sophia H wrote: Red Word that prompted transfer to Nurse Triage: Patient states she is having really bad vertigo, believes she has a double ear infection (worse on left side) - wanting to get in with PCP

## 2024-02-21 ENCOUNTER — Ambulatory Visit: Admitting: Family Medicine

## 2024-02-21 ENCOUNTER — Encounter: Payer: Self-pay | Admitting: Family Medicine

## 2024-02-21 VITALS — BP 118/81 | HR 69 | Ht 61.0 in | Wt 214.1 lb

## 2024-02-21 DIAGNOSIS — D72820 Lymphocytosis (symptomatic): Secondary | ICD-10-CM | POA: Diagnosis not present

## 2024-02-21 DIAGNOSIS — J019 Acute sinusitis, unspecified: Secondary | ICD-10-CM | POA: Diagnosis not present

## 2024-02-21 DIAGNOSIS — H8110 Benign paroxysmal vertigo, unspecified ear: Secondary | ICD-10-CM | POA: Diagnosis not present

## 2024-02-21 NOTE — Progress Notes (Signed)
" ° °  Established Patient Office Visit  Subjective   Patient ID: Angela Ramsey, female    DOB: January 01, 1966  Age: 59 y.o. MRN: [id number]  Chief Complaint  Patient presents with   Dizziness    History of Present Illness   Angela Ramsey is a 59 year old female who presents with vertigo.  She has been experiencing vertigo since last Sunday, which is continuous with head movements and lasts all day. The vertigo initially resolved on Monday but returned on Thursday and has persisted since then. It occurs with head movements, such as turning her head, and improves when she stops moving. No current room spinning or nausea.  She recalls a similar episode last year that lasted for three months and required three different doses of treatment before it resolved. During that time, she also experienced ear complaints at night when turning over in bed, which are not present this time. Currently, she experiences wooziness during the day.  A few weeks ago, she experienced ear symptoms, including a clogged sensation and echoing, which she treated with leftover amoxicillin for two days, resulting in resolution of symptoms. She also noted swelling of the gland behind her ear at that time.  She reports sinus drainage and bad headaches starting over the weekend. She has tried Dramamine, which may have provided slight relief for about an hour. No decreased hearing, ear pain, or tinnitus currently.          The 10-year ASCVD risk score (Arnett DK, et al., 2019) is: 1.9%  Health Maintenance Due  Topic Date Due   Hepatitis B Vaccines 19-59 Average Risk (1 of 3 - 19+ 3-dose series) Never done   COVID-19 Vaccine (1 - 2025-26 season) Never done      Objective:     BP 118/81   Pulse 69   Ht 5' 1 (1.549 m)   Wt 214 lb 1.9 oz (97.1 kg)   LMP 12/19/2015 Comment: sexually active  SpO2 98%   BMI 40.46 kg/m    Physical Exam   Gen: alert, oriented Pulm: no respiratory distress Psych:  pleasant affect NEUROLOGICAL: nystagmus developed when pt laid supine, which resolved within a few minutes.  Cn 2-12  grossly intact.  Strength and sensation equal b/l       No results found for any visits on 02/21/24.      Assessment & Plan:   Benign paroxysmal positional vertigo, unspecified laterality Assessment & Plan: Intermittent vertigo triggered by head movements, consistent with BPPV. Discussed Epley maneuver as primary treatment. Advised against prolonged Afrin use due to rebound congestion risk. - Referred to [facility] for Epley maneuver instruction. - Advised use of Flonase  once daily in each nostril. - Recommended Afrin as needed, twice daily, not exceeding five consecutive days.  Orders: -     Ambulatory referral to Physical Therapy  Acute non-recurrent sinusitis, unspecified location Assessment & Plan: Recent sinus drainage and headaches suggest acute sinusitis or allergic rhinitis. Discussed potential viral or allergic etiology. - Use Afrin as needed for sinus symptoms, not exceeding five consecutive days. - Use Flonase  once daily for inflammation.   Elevated lymphocyte count Assessment & Plan: Noted in previous labs. Plan to monitor with flow cytometry test in June. - Ordered flow cytometry test for June.            Return if symptoms worsen or fail to improve.    Toribio MARLA Slain, MD  "

## 2024-02-21 NOTE — Patient Instructions (Signed)
 It was nice to see you today,  We addressed the following topics today: - you can use flonase  nasal spray daily and afrin nasal spray twice a day as needed (for up to 5 days) to help with nasal/ear inflammation that may be contributing to your symptoms - I am sending in a referral to the physical therapist at [facility].  Someone should call you soon to schedule this.   Have a great day,  Rolan Slain, MD

## 2024-02-22 DIAGNOSIS — H8110 Benign paroxysmal vertigo, unspecified ear: Secondary | ICD-10-CM | POA: Insufficient documentation

## 2024-02-22 DIAGNOSIS — J019 Acute sinusitis, unspecified: Secondary | ICD-10-CM | POA: Insufficient documentation

## 2024-02-22 NOTE — Assessment & Plan Note (Signed)
 Noted in previous labs. Plan to monitor with flow cytometry test in June. - Ordered flow cytometry test for June.

## 2024-02-22 NOTE — Assessment & Plan Note (Addendum)
 Intermittent vertigo triggered by head movements, consistent with BPPV. Discussed Epley maneuver as primary treatment. Advised against prolonged Afrin use due to rebound congestion risk. - Referred to [facility] for Epley maneuver instruction. - Advised use of Flonase  once daily in each nostril. - Recommended Afrin as needed, twice daily, not exceeding five consecutive days.

## 2024-02-22 NOTE — Assessment & Plan Note (Signed)
 Recent sinus drainage and headaches suggest acute sinusitis or allergic rhinitis. Discussed potential viral or allergic etiology. - Use Afrin as needed for sinus symptoms, not exceeding five consecutive days. - Use Flonase  once daily for inflammation.

## 2024-07-27 ENCOUNTER — Other Ambulatory Visit

## 2024-08-03 ENCOUNTER — Encounter: Admitting: Family Medicine
